# Patient Record
Sex: Female | Born: 1955 | Race: White | Hispanic: No | Marital: Single | Smoking: Never smoker
Health system: Southern US, Community
[De-identification: ages and names within clinical notes are randomized; demographics above are authoritative.]

## PROBLEM LIST (undated history)

## (undated) DIAGNOSIS — H548 Legal blindness, as defined in USA: Secondary | ICD-10-CM

## (undated) DIAGNOSIS — E785 Hyperlipidemia, unspecified: Secondary | ICD-10-CM

## (undated) DIAGNOSIS — I10 Essential (primary) hypertension: Secondary | ICD-10-CM

## (undated) DIAGNOSIS — H269 Unspecified cataract: Secondary | ICD-10-CM

## (undated) DIAGNOSIS — Z8719 Personal history of other diseases of the digestive system: Secondary | ICD-10-CM

## (undated) DIAGNOSIS — M1990 Unspecified osteoarthritis, unspecified site: Secondary | ICD-10-CM

## (undated) DIAGNOSIS — F419 Anxiety disorder, unspecified: Secondary | ICD-10-CM

## (undated) DIAGNOSIS — G8929 Other chronic pain: Secondary | ICD-10-CM

## (undated) DIAGNOSIS — J189 Pneumonia, unspecified organism: Secondary | ICD-10-CM

## (undated) DIAGNOSIS — M545 Other chronic pain: Secondary | ICD-10-CM

## (undated) DIAGNOSIS — F329 Major depressive disorder, single episode, unspecified: Secondary | ICD-10-CM

## (undated) DIAGNOSIS — J342 Deviated nasal septum: Secondary | ICD-10-CM

## (undated) DIAGNOSIS — G43909 Migraine, unspecified, not intractable, without status migrainosus: Secondary | ICD-10-CM

## (undated) DIAGNOSIS — K219 Gastro-esophageal reflux disease without esophagitis: Secondary | ICD-10-CM

## (undated) HISTORY — PX: ROTATOR CUFF REPAIR: SHX139

## (undated) HISTORY — PX: OTHER SURGICAL HISTORY: SHX169

## (undated) HISTORY — DX: Essential (primary) hypertension: I10

## (undated) HISTORY — DX: Unspecified osteoarthritis, unspecified site: M19.90

## (undated) HISTORY — PX: CATARACT EXTRACTION, BILATERAL: SHX1313

## (undated) HISTORY — PX: RETINAL DETACHMENT SURGERY: SHX105

## (undated) HISTORY — DX: Unspecified cataract: H26.9

## (undated) HISTORY — PX: CHOLECYSTECTOMY: SHX55

---

## 1998-06-12 ENCOUNTER — Ambulatory Visit (HOSPITAL_COMMUNITY): Admission: RE | Admit: 1998-06-12 | Discharge: 1998-06-12 | Payer: Self-pay | Admitting: Orthopedic Surgery

## 1998-06-14 ENCOUNTER — Emergency Department (HOSPITAL_COMMUNITY): Admission: EM | Admit: 1998-06-14 | Discharge: 1998-06-14 | Payer: Self-pay | Admitting: Emergency Medicine

## 1998-11-05 ENCOUNTER — Encounter: Payer: Self-pay | Admitting: Orthopedic Surgery

## 1998-11-10 ENCOUNTER — Encounter: Payer: Self-pay | Admitting: Orthopedic Surgery

## 1998-11-11 ENCOUNTER — Inpatient Hospital Stay (HOSPITAL_COMMUNITY): Admission: AD | Admit: 1998-11-11 | Discharge: 1998-11-12 | Payer: Self-pay | Admitting: Orthopedic Surgery

## 1999-01-08 ENCOUNTER — Other Ambulatory Visit: Admission: RE | Admit: 1999-01-08 | Discharge: 1999-01-08 | Payer: Self-pay | Admitting: Emergency Medicine

## 2000-02-29 ENCOUNTER — Encounter: Payer: Self-pay | Admitting: Emergency Medicine

## 2000-02-29 ENCOUNTER — Encounter: Admission: RE | Admit: 2000-02-29 | Discharge: 2000-02-29 | Payer: Self-pay | Admitting: Emergency Medicine

## 2001-05-21 ENCOUNTER — Encounter: Admission: RE | Admit: 2001-05-21 | Discharge: 2001-05-21 | Payer: Self-pay | Admitting: Emergency Medicine

## 2001-05-21 ENCOUNTER — Encounter: Payer: Self-pay | Admitting: Emergency Medicine

## 2002-01-04 ENCOUNTER — Other Ambulatory Visit: Admission: RE | Admit: 2002-01-04 | Discharge: 2002-01-04 | Payer: Self-pay | Admitting: Obstetrics and Gynecology

## 2002-04-25 ENCOUNTER — Encounter: Admission: RE | Admit: 2002-04-25 | Discharge: 2002-04-25 | Payer: Self-pay | Admitting: Emergency Medicine

## 2002-04-25 ENCOUNTER — Encounter: Payer: Self-pay | Admitting: Emergency Medicine

## 2002-05-22 ENCOUNTER — Encounter: Payer: Self-pay | Admitting: Emergency Medicine

## 2002-05-22 ENCOUNTER — Encounter: Admission: RE | Admit: 2002-05-22 | Discharge: 2002-05-22 | Payer: Self-pay | Admitting: Emergency Medicine

## 2002-08-26 ENCOUNTER — Ambulatory Visit (HOSPITAL_BASED_OUTPATIENT_CLINIC_OR_DEPARTMENT_OTHER): Admission: RE | Admit: 2002-08-26 | Discharge: 2002-08-26 | Payer: Self-pay | Admitting: General Surgery

## 2003-05-26 ENCOUNTER — Encounter: Payer: Self-pay | Admitting: Emergency Medicine

## 2003-05-26 ENCOUNTER — Encounter: Admission: RE | Admit: 2003-05-26 | Discharge: 2003-05-26 | Payer: Self-pay | Admitting: Emergency Medicine

## 2003-10-04 HISTORY — PX: OTHER SURGICAL HISTORY: SHX169

## 2003-10-26 ENCOUNTER — Observation Stay (HOSPITAL_COMMUNITY): Admission: EM | Admit: 2003-10-26 | Discharge: 2003-10-27 | Payer: Self-pay | Admitting: Emergency Medicine

## 2004-05-18 ENCOUNTER — Ambulatory Visit (HOSPITAL_COMMUNITY): Admission: RE | Admit: 2004-05-18 | Discharge: 2004-05-18 | Payer: Self-pay | Admitting: Chiropractic Medicine

## 2004-05-24 ENCOUNTER — Ambulatory Visit (HOSPITAL_COMMUNITY): Admission: RE | Admit: 2004-05-24 | Discharge: 2004-05-24 | Payer: Self-pay | Admitting: Chiropractic Medicine

## 2004-05-26 ENCOUNTER — Encounter: Admission: RE | Admit: 2004-05-26 | Discharge: 2004-05-26 | Payer: Self-pay | Admitting: Emergency Medicine

## 2004-06-17 ENCOUNTER — Observation Stay (HOSPITAL_COMMUNITY): Admission: RE | Admit: 2004-06-17 | Discharge: 2004-06-18 | Payer: Self-pay | Admitting: Orthopedic Surgery

## 2004-08-31 ENCOUNTER — Ambulatory Visit (HOSPITAL_COMMUNITY): Admission: RE | Admit: 2004-08-31 | Discharge: 2004-08-31 | Payer: Self-pay | Admitting: *Deleted

## 2005-12-19 ENCOUNTER — Encounter: Admission: RE | Admit: 2005-12-19 | Discharge: 2005-12-19 | Payer: Self-pay | Admitting: Emergency Medicine

## 2007-03-22 ENCOUNTER — Ambulatory Visit: Payer: Self-pay | Admitting: Family Medicine

## 2007-03-26 ENCOUNTER — Ambulatory Visit: Payer: Self-pay | Admitting: *Deleted

## 2007-03-27 ENCOUNTER — Ambulatory Visit (HOSPITAL_COMMUNITY): Admission: RE | Admit: 2007-03-27 | Discharge: 2007-03-27 | Payer: Self-pay | Admitting: Family Medicine

## 2007-03-28 ENCOUNTER — Ambulatory Visit: Payer: Self-pay | Admitting: Internal Medicine

## 2007-05-21 ENCOUNTER — Encounter (INDEPENDENT_AMBULATORY_CARE_PROVIDER_SITE_OTHER): Payer: Self-pay | Admitting: Nurse Practitioner

## 2007-05-21 ENCOUNTER — Ambulatory Visit: Payer: Self-pay | Admitting: Internal Medicine

## 2007-05-21 LAB — CONVERTED CEMR LAB
Cholesterol: 214 mg/dL — ABNORMAL HIGH (ref 0–200)
HDL: 51 mg/dL (ref >39)
LDL Cholesterol: 120 mg/dL — ABNORMAL HIGH (ref 0–99)
Total CHOL/HDL Ratio: 4.2
Triglycerides: 217 mg/dL — ABNORMAL HIGH (ref <150)
VLDL: 43 mg/dL — ABNORMAL HIGH (ref 0–40)

## 2007-06-26 ENCOUNTER — Ambulatory Visit: Payer: Self-pay | Admitting: Internal Medicine

## 2007-07-24 ENCOUNTER — Ambulatory Visit: Payer: Self-pay | Admitting: Nurse Practitioner

## 2007-07-24 LAB — CONVERTED CEMR LAB
Cholesterol: 227 mg/dL — ABNORMAL HIGH (ref 0–200)
HDL: 54 mg/dL (ref >39)
LDL Cholesterol: 136 mg/dL — ABNORMAL HIGH (ref 0–99)
Total CHOL/HDL Ratio: 4.2
Triglycerides: 185 mg/dL — ABNORMAL HIGH (ref <150)
VLDL: 37 mg/dL (ref 0–40)

## 2007-08-24 ENCOUNTER — Ambulatory Visit: Payer: Self-pay | Admitting: Internal Medicine

## 2007-10-17 ENCOUNTER — Ambulatory Visit: Payer: Self-pay | Admitting: Family Medicine

## 2008-03-31 ENCOUNTER — Ambulatory Visit (HOSPITAL_COMMUNITY): Admission: RE | Admit: 2008-03-31 | Discharge: 2008-03-31 | Payer: Self-pay | Admitting: Family Medicine

## 2008-07-15 ENCOUNTER — Ambulatory Visit: Payer: Self-pay | Admitting: Internal Medicine

## 2008-07-15 LAB — CONVERTED CEMR LAB
ALT: 22 units/L (ref 0–35)
AST: 20 units/L (ref 0–37)
Albumin: 4.4 g/dL (ref 3.5–5.2)
Alkaline Phosphatase: 63 units/L (ref 39–117)
BUN: 13 mg/dL (ref 6–23)
Basophils Absolute: 0 10*3/uL (ref 0.0–0.1)
Basophils Relative: 0 % (ref 0–1)
CO2: 19 meq/L (ref 19–32)
Calcium: 8.9 mg/dL (ref 8.4–10.5)
Chloride: 106 meq/L (ref 96–112)
Cholesterol: 220 mg/dL — ABNORMAL HIGH (ref 0–200)
Creatinine, Ser: 0.63 mg/dL (ref 0.40–1.20)
Eosinophils Absolute: 0.3 10*3/uL (ref 0.0–0.7)
Eosinophils Relative: 4 % (ref 0–5)
Glucose, Bld: 101 mg/dL — ABNORMAL HIGH (ref 70–99)
HCT: 38.2 % (ref 36.0–46.0)
HDL: 61 mg/dL (ref >39)
Hemoglobin: 12.4 g/dL (ref 12.0–15.0)
LDL Cholesterol: 122 mg/dL — ABNORMAL HIGH (ref 0–99)
Lymphocytes Relative: 31 % (ref 12–46)
Lymphs Abs: 2.1 10*3/uL (ref 0.7–4.0)
MCHC: 32.5 g/dL (ref 30.0–36.0)
MCV: 92.3 fL (ref 78.0–100.0)
Microalb, Ur: 0.79 mg/dL (ref 0.00–1.89)
Monocytes Absolute: 0.3 10*3/uL (ref 0.1–1.0)
Monocytes Relative: 5 % (ref 3–12)
Neutro Abs: 4.2 10*3/uL (ref 1.7–7.7)
Neutrophils Relative %: 60 % (ref 43–77)
Platelets: 296 10*3/uL (ref 150–400)
Potassium: 3.6 meq/L (ref 3.5–5.3)
RBC: 4.14 M/uL (ref 3.87–5.11)
RDW: 14 % (ref 11.5–15.5)
Sodium: 142 meq/L (ref 135–145)
Total Bilirubin: 0.5 mg/dL (ref 0.3–1.2)
Total CHOL/HDL Ratio: 3.6
Total Protein: 7.1 g/dL (ref 6.0–8.3)
Triglycerides: 187 mg/dL — ABNORMAL HIGH (ref <150)
VLDL: 37 mg/dL (ref 0–40)
WBC: 6.9 10*3/uL (ref 4.0–10.5)

## 2008-07-24 ENCOUNTER — Ambulatory Visit: Payer: Self-pay | Admitting: Internal Medicine

## 2008-08-14 ENCOUNTER — Other Ambulatory Visit: Admission: RE | Admit: 2008-08-14 | Discharge: 2008-08-14 | Payer: Self-pay | Admitting: Obstetrics and Gynecology

## 2008-08-14 ENCOUNTER — Ambulatory Visit: Payer: Self-pay | Admitting: Obstetrics & Gynecology

## 2008-09-04 ENCOUNTER — Ambulatory Visit: Payer: Self-pay | Admitting: Obstetrics & Gynecology

## 2008-10-15 ENCOUNTER — Ambulatory Visit: Payer: Self-pay | Admitting: Internal Medicine

## 2008-10-15 ENCOUNTER — Encounter (INDEPENDENT_AMBULATORY_CARE_PROVIDER_SITE_OTHER): Payer: Self-pay | Admitting: Internal Medicine

## 2008-10-15 LAB — CONVERTED CEMR LAB
Cholesterol: 203 mg/dL — ABNORMAL HIGH (ref 0–200)
HDL: 61 mg/dL (ref >39)
LDL Cholesterol: 111 mg/dL — ABNORMAL HIGH (ref 0–99)
Total CHOL/HDL Ratio: 3.3
Triglycerides: 155 mg/dL — ABNORMAL HIGH (ref <150)
VLDL: 31 mg/dL (ref 0–40)

## 2008-10-24 ENCOUNTER — Ambulatory Visit: Payer: Self-pay | Admitting: Internal Medicine

## 2008-11-13 ENCOUNTER — Ambulatory Visit: Payer: Self-pay | Admitting: Internal Medicine

## 2010-04-28 ENCOUNTER — Ambulatory Visit (HOSPITAL_COMMUNITY): Admission: RE | Admit: 2010-04-28 | Discharge: 2010-04-28 | Payer: Self-pay | Admitting: Emergency Medicine

## 2011-02-15 NOTE — Group Therapy Note (Signed)
NAME:  INTISAR, CLAUDIO                   ACCOUNT NO.:  [id number]   MEDICAL RECORD NO.:  [id number]          PATIENT TYPE:  WOC   LOCATION:  [facility]                   FACILITY:  [facility]   PHYSICIAN:  Dorthula Perfect, MD     DATE OF BIRTH:  [birth year]   DATE OF SERVICE:                                  CLINIC NOTE   This 55 year old white female, gravida 0, is referred from health  therapy because of postmenopausal bleeding.   The patient takes Sprintec 28.  Her last menstrual period was 1-1/2  years ago.  Occasionally, over the past year she has had some spotting.  Two months ago, she had 14 days of bleeding, 10 days of which were heavy  with clots.  She was referred for endometrial biopsy.   PAST MEDICAL HISTORY:   OPERATIONS:  1. Cataracts.  2. Right shoulder operations x2.  3. Left shoulder operation.  4. Two detached retinas.  5. Lumbar disk surgery.  6. D&C years ago.   ALLERGIES:  Denies.   MEDICATIONS:  Listed in the chart and include Maxzide and Prozac.   FAMILY HISTORY:  Negative.   PHYSICAL EXAMINATION:  VITAL SIGNS:  Blood pressure 127/72 and weight  223 pounds.  She is on a weight-loss program.  ABDOMEN:  Somewhat obese, soft, and nontender.  No masses are felt.  PELVIC:  External genitalia and BUS glands are normal.  Vaginal wall is  epithelialized as was the cervix.  There is no bleeding noted.  Uterus  is in the midline.  Normal size and shape.  Ovaries are not felt.   Anterior lip of the cervix was grasped with single tube tenaculum and  endocervical canal sounded approximately 7.5 cm.  Endometrial biopsy was  performed.  Looking in the specimen in cup reveals what appears to be a  polyp.   The patient tolerated the procedure well.   DIAGNOSIS:  Postmenopausal bleeding.   PROCEDURE:  Endometrial biopsy.   DISPOSITION:  1. Continue the Sprintec.  2. Return in 2 weeks for results.           ______________________________  Dorthula Perfect, MD     ER/MEDQ  D:  08/14/2008  T:  08/15/2008  Job:  [id number]

## 2011-02-18 NOTE — H&P (Signed)
NAME:  Anne Anne [name], Anne Anne [name]                             ACCOUNT NO.:  [id number]   MEDICAL RECORD NO.:  [id number]                   PATIENT TYPE:  EMS   LOCATION:  [facility]                                 FACILITY:  [facility]   PHYSICIAN:  Hettie Holstein, D.O.                 DATE OF BIRTH:  1955/11/30   DATE OF ADMISSION:  10/26/2003  DATE OF DISCHARGE:                                HISTORY & PHYSICAL   PRIMARY CARE PHYSICIAN:  Reuben Likes, M.D.   CHIEF COMPLAINT:  Chest pain.   HISTORY OF PRESENT ILLNESS:  This is Anne [name] 55 year old Caucasian female  with  only Anne [name] history of hypertension, no history of diabetes or known  coronary  artery disease, although her EKG this admission does reveal Q-waves to  suggest prior infarct, age indeterminate. She developed chest pain on and  off for the past week. She stated she  initially thought it was indigestion.  Deep breaths seemed to alleviate the pain. She has also had Anne [name] headache for  the past 2 weeks. This morning she developed Anne [name] real sharp midsternal chest  pain that radiated to her back and she developed some tingling sensation  about her arms bilaterally  down to her fingertips and face. She stated that  this got worse and she could feel this in her lips.   She stated the pain was 10/10, and by the time she arrived in the emergency  department and was given IV fluids, some morphine  and potassium, she did  feel somewhat better. The emergency department physician ordered Anne [name] CT scan  of her chest which was normal. The EKG findings were as above. Anteroseptal  infarct, age indeterminate with some PACs.   Otherwise, she does report unilateral headache, throbbing. She states that  she has had headaches before that responded to Vicodin, which her primary  care physician writes her prescriptions for. She has recently been on  treatment for clinically suspected sinus infection with Anne [name] 10 day course of  Augmentin followed  by Anne [name] 9 day course with Avelox.   PAST  MEDICAL HISTORY:  Significant for:  1. Hypertension.  2. History of recurrent sinus infections.  3. Clinical back pain, status post surgery for spurs in her low back.  4. Shoulder surgery.  5. 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  6. Cataract surgery x2.  7. Cholecystectomy.   MEDICATIONS:  1. Hydrochlorothiazide 25 mg/Triamterene 37.5 mg p.o. every day.  2. Birth control pills.  3. Avelox eye drops.  4. Allegra D and Excedrin.  5. Vicodin.   ALLERGIES:  No known drug allergies.   SOCIAL HISTORY:  The patient does not smoke. She only drinks occasional  alcohol. 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   FAMILY HISTORY:  Her father died at age 59 with lung cancer. Her mother is  living, unknown health history per her daughter.   REVIEW OF SYSTEMS:  The patient reports positive nausea. She reports Anne [name]  headache, no fevers or chills. She felt some fluttering palpitations just  with this presenting illness. She denies any diarrhea, constipation or  urinary problems. No lower extremity edema, swelling or pain.   PHYSICAL EXAMINATION:  VITAL SIGNS:  Blood pressure 121/82, temperature  98.1, temperature 98.1, pulse 100, respirations 20, oxygen saturation 97% on  room air.  GENERAL:  This is Anne [name] well appearing, comfortable Caucasian female  in no  apparent distress.  HEENT:  Normocephalic and atraumatic. Extraocular muscles intact. No  conjunctival pallor or jaundice.  NECK:  Supple, nontender, no palpable thyromegaly.  CHEST:  Clear to auscultation bilaterally.  HEART:  Normal S1 and S2 without S3 or murmur.  ABDOMEN:  Soft, nontender, nondistended. Normoactive bowel sounds.  EXTREMITIES:  Symmetrical pulses, no peripheral clubbing, cyanosis or edema.   LABORATORY DATA:  Available as follows:  Her CK initial revealed Anne [name] myoglobin  of 69.7, MB 4.2, troponin less than 0.05 at 3 p.m. Her iSTAT revealed Anne [name]  creatinine of 0.7. Electrolytes revealed Anne [name] sodium  138,  potassium 2.2,  chloride 103 and glucose of 103. Further labs are pending including  CBC,  TMP, TSH and T4.   Anne [name] CT scan revealed no acute findings.   IMPRESSION:  1. Atypical chest pain versus noncardiac chest pain.  2. Hypokalemia.  3. Cephalgia.   PLAN:  At this time we are going to admit Ms. [name] to Anne [name] telemetry  floor for  23-hour observation, cycle her enzymes, hold her HCTZ Triamterene, as this  could possibly be causing her hypokalemia. We will replete her potassium. We  will check repeat  chemistries to be sure that this is Anne [name] correct number and  follow her clinically and treat her headaches with Reglan as well as  Oxycodone if they remain refractory.                                                Hettie Holstein, D.O.    ESS/MEDQ  D:  10/26/2003  T:  10/26/2003  Job:  [id number]   cc:   Reuben Likes, M.D.  [address]  [place]  [place] [address]  Fax: [phone]
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
## 2011-02-18 NOTE — Op Note (Signed)
   NAME:  Anne [name], Anne [name]                             ACCOUNT NO.:  [id number]   MEDICAL RECORD NO.:  [id number]                   PATIENT TYPE:  AMB   LOCATION:  [facility]                                  FACILITY:  [facility]   PHYSICIAN:  Gabrielle Dare. Janee Morn, M.D.             DATE OF BIRTH:  [birth year]   DATE OF PROCEDURE:  08/26/2002  DATE OF DISCHARGE:                                 OPERATIVE REPORT   PREOPERATIVE DIAGNOSIS:  Atypical melanocytic lesion, gluteal cleft.   POSTOPERATIVE DIAGNOSIS:  Atypical melanocytic lesion, gluteal cleft.   PROCEDURE:  Excision of atypical melanocytic lesion in gluteal cleft.   SURGEON:  Gabrielle Dare. Janee Morn, M.D.   ANESTHESIA:  MAC.   CLINICAL NOTE:  The patient is [name] 55 year old female who noticed [name] mole with  some changes at the top of her gluteal cleft.  This was evaluated by her  primary care doctor and removed with [name] shave biopsy.  This showed some  atypical melanocytic proliferation.  She now presents for excision for  definitive pathologic diagnosis.   DESCRIPTION OF PROCEDURE:  The patient was brought to the operating room.  MAC anesthesia was administered.  She was placed in the prone position.  She  received IV antibiotics.  Her gluteal cleft area was prepped and draped in [name]  sterile fashion.  The affected area was infused with 0.25% Marcaine for  anesthesia.  An excision was made in elliptical fashion encompassing the  area of the previous biopsy.  Subcutaneous tissues were dissected down  sharply.  Upon entering the area of the previous lesion, biopsy was removed  completely along with [name] small amount of subcutaneous tissue.  This was  tagged for orientation and sent to pathology.  The subcutaneous area was  irrigated.  Bovie cautery was used to get meticulous hemostasis.  Subsequently the area was closed with [name] series of interrupted 3-0 nylon  sutures.  Sponge and needle counts were correct.  The patient tolerated the  procedure well without  complication and was taken to the recovery room in  stable condition.                                               Gabrielle Dare Janee Morn, M.D.    BET/MEDQ  D:  08/26/2002  T:  08/26/2002  Job:  [id number]

## 2011-02-18 NOTE — Discharge Summary (Signed)
NAME:  [name], [name]                             ACCOUNT NO.:  [id number]   MEDICAL RECORD NO.:  [id number]                   PATIENT TYPE:  INP   LOCATION:  3712                                 FACILITY:  [facility]   PHYSICIAN:  Hettie Holstein, D.O.                 DATE OF BIRTH:  August 16, 1956   DATE OF ADMISSION:  10/26/2003  DATE OF DISCHARGE:  10/27/2003                                 DISCHARGE SUMMARY   ADMISSION DIAGNOSES:  1. Chest pain.  2. Hypokalemia.  3. Headache.   DISCHARGE DIAGNOSES:  1. Chest pain, no evidence of acute myocardial infarction by serial cardiac     enzymes and no evidence of pulmonary embolism by CT scan of the chest.  2. Hypokalemia, improved, however, the patient is still hypokalemic and     administering p.o. potassium supplements with follow-up lab work and     instructions for the patient to return with recurrent symptoms.  3. Cephalgia, much improved.  The patient responded to Reglan in the     hospital, however, I am sending her home with p.r.n. q.h.s. Valium for     suspected tension-type headache.   DISPOSITION:  The patient is being discharged to home in stable condition,  improved, and asymptomatic with recommendations for low cholesterol and low  sodium diet and instructions to have basic metabolic panel performed with  results faxed to her primary care physician, Reuben Likes, M.D., at [phone].  For any problems or difficulties, she is to call [phone] regarding  this hospitalization or she was instructed to return to the hospital if she  experienced recurrence of her symptoms.  She has a follow-up appointment  with Dr. Lorenz Coaster at 11:15 a.m. on November 03, 2003.   HISTORY OF PRESENT ILLNESS:  The patient presented on October 26, 2003, with  complaints of chest pain.  She has a history of hypertension.  No diabetes  or coronary artery disease noted.  She has EKG that revealed anterior Q  waves, age undeterminate.  She developed chest pain on  and off for the past  week.  She states she initially thought it was indigestion.  Deep breath  seemed to alleviate the pain.  She also had developed a real sharp  midsternal chest pain that radiated to her back and she developed a tingling  sensation about her arms bilaterally down to her fingertips and face.  She  states that this got worse and she could feel this in her limbs.  She stated  that the pain was 10 out of 10 and by the time she arrived in the emergency  department and was given IV fluids of morphine and potassium, she felt  better.  ER ordered a CT scan which was normal and EKG findings were as  above age indeterminate infarct and some PAC's.   HOSPITAL COURSE:  She was  admitted to the telemetry floor, her enzymes were  cycled.  She exhibited no bump in her cardiac markers and no evidence of  acute MI.  Her headache resolved with Reglan and her chest discomfort and  vague complaints resolved with repletion of potassium p.o. 20 mEq q.4h.  Her  hydrochlorothiazide/Triamterene was held and it was suspected as possible  contributing factor in her electrolyte abnormalities.   DISCHARGE MEDICATIONS:  The patient is instructed to stop taking her  hydrochlorothiazide until further recommendations per her primary care  physician, Dr. Lorenz Coaster.  She should resume her other medications as prior to  admission.  She is instructed to take K-Lor 40 mEq p.o. q.4h x3 doses, then  K-Lor 40 mEq p.o. daily x7 days.  She should have a follow-up lab in a week.  She was also instructed to take magnesium oxide 400 mg p.o. daily x1 week.   DISCHARGE LABORATORY DATA:  Normal CT of the chest.  EKG with a questionable  intraseptal infarct in the past, age indeterminate.   Sodium 138, potassium 2.8, chloride 103, CO2 27, BUN 12, creatinine 0.7.  Cardiac markers negative x3.  Thyroid studies were within normal limits.  She had a CBC that revealed WBC of 9.2, hemoglobin 13.6, hematocrit 40.4,  MCV 91,  platelet count 336.   DISCHARGE RECOMMENDATIONS:  1. She should undergo outpatient risk stratification.  Recommend an     outpatient treadmill stress test.  2. She should have follow-up lab studies.  3. She should see her primary care physician on November 03, 2003.                                                Hettie Holstein, D.O.    ESS/MEDQ  D:  10/27/2003  T:  10/27/2003  Job:  [id number]   cc:   Reuben Likes, M.D.  [address]  [place]  [place] [address]  Fax: [phone]

## 2012-08-07 ENCOUNTER — Other Ambulatory Visit (HOSPITAL_COMMUNITY): Payer: Self-pay | Admitting: Internal Medicine

## 2012-08-07 DIAGNOSIS — Z1231 Encounter for screening mammogram for malignant neoplasm of breast: Secondary | ICD-10-CM

## 2012-08-09 ENCOUNTER — Ambulatory Visit (HOSPITAL_COMMUNITY): Payer: Self-pay

## 2012-08-27 ENCOUNTER — Ambulatory Visit (HOSPITAL_COMMUNITY)
Admission: RE | Admit: 2012-08-27 | Discharge: 2012-08-27 | Disposition: A | Discharge status: Home / Self Care | Payer: Self-pay | Source: Ambulatory Visit | Attending: Internal Medicine | Admitting: Internal Medicine

## 2012-08-27 DIAGNOSIS — Z1231 Encounter for screening mammogram for malignant neoplasm of breast: Secondary | ICD-10-CM

## 2012-08-31 ENCOUNTER — Other Ambulatory Visit: Payer: Self-pay | Admitting: Internal Medicine

## 2012-08-31 DIAGNOSIS — R928 Other abnormal and inconclusive findings on diagnostic imaging of breast: Secondary | ICD-10-CM

## 2012-09-10 ENCOUNTER — Encounter (HOSPITAL_COMMUNITY): Payer: Self-pay | Admitting: *Deleted

## 2012-09-18 ENCOUNTER — Other Ambulatory Visit: Payer: Self-pay | Admitting: Obstetrics and Gynecology

## 2012-09-18 ENCOUNTER — Ambulatory Visit (HOSPITAL_COMMUNITY)
Admission: RE | Admit: 2012-09-18 | Discharge: 2012-09-18 | Disposition: A | Discharge status: Home / Self Care | Payer: Self-pay | Source: Ambulatory Visit | Attending: Obstetrics and Gynecology | Admitting: Obstetrics and Gynecology

## 2012-09-18 ENCOUNTER — Encounter (HOSPITAL_COMMUNITY): Payer: Self-pay

## 2012-09-18 VITALS — BP 110/76 | Temp 98.3°F | Ht 66.5 in | Wt 212.8 lb

## 2012-09-18 DIAGNOSIS — N898 Other specified noninflammatory disorders of vagina: Secondary | ICD-10-CM

## 2012-09-18 DIAGNOSIS — Z01419 Encounter for gynecological examination (general) (routine) without abnormal findings: Secondary | ICD-10-CM

## 2012-09-18 NOTE — Progress Notes (Signed)
Patient referred to [facility] by the [facility] per recommendation of additional imaging of her right breast. Screening mammogram completed 08/27/2012 at [facility].  Pap Smear:  Pap smear completed today. Per patient last Pap smear was around 2009 and normal. Per patient no history of abnormal Pap smears. No Pap smear results in EPIC.  Physical exam: Breasts Breasts symmetrical. No skin abnormalities bilateral breasts. No nipple retraction bilateral breasts. No nipple discharge bilateral breasts. No lymphadenopathy. No lumps palpated bilateral breasts. Patient referred to the [facility] per recommendation for right breast diagnostic mammogram and possible right breast ultrasound. Appointment scheduled for Monday, October 01, 2012 at 1540.           Pelvic/Bimanual   Ext Genitalia No lesions, no swelling and no discharge observed on external genitalia.         Vagina Vagina pink and normal texture. No lesions and white frothy discharge observed in vagina. Wet prep completed.          Cervix Cervix is present. Cervix pink and of normal texture. White frothy discharge observed on cervical os.    Uterus Uterus is present and palpable. Uterus in normal position and normal size.        Adnexae Bilateral ovaries present and palpable. No tenderness on palpation.          Rectovaginal No rectal exam completed today since patient had no rectal complaints. No skin abnormalities observed on exam.

## 2012-09-18 NOTE — Patient Instructions (Signed)
Taught patient how to perform BSE and gave educational materials to take home. Let her know [facility] will cover Pap smears every 3 years unless has a history of abnormal Pap smears. Patient referred to the [facility] per recommendation for right breast diagnostic mammogram and possible right breast ultrasound. Appointment scheduled for Monday, October 01, 2012 at 1540.Patient aware of appointment and will be there. Let patient know will follow up with her within the next couple weeks with results by letter or phone. Patient verbalized understanding.

## 2012-09-20 LAB — WET PREP, GENITAL
Clue Cells Wet Prep HPF POC: NONE SEEN
Trich, Wet Prep: NONE SEEN
Yeast Wet Prep HPF POC: NONE SEEN

## 2012-10-01 ENCOUNTER — Other Ambulatory Visit: Payer: Self-pay

## 2012-10-01 ENCOUNTER — Ambulatory Visit
Admission: RE | Admit: 2012-10-01 | Discharge: 2012-10-01 | Disposition: A | Discharge status: Home / Self Care | Payer: No Typology Code available for payment source | Source: Ambulatory Visit | Attending: Internal Medicine | Admitting: Internal Medicine

## 2012-10-01 DIAGNOSIS — R928 Other abnormal and inconclusive findings on diagnostic imaging of breast: Secondary | ICD-10-CM

## 2013-03-24 ENCOUNTER — Ambulatory Visit: Payer: Self-pay | Admitting: Family Medicine

## 2013-03-24 VITALS — BP 154/88 | HR 100 | Temp 97.9°F | Resp 16 | Ht 66.0 in | Wt 223.0 lb

## 2013-03-24 DIAGNOSIS — J0100 Acute maxillary sinusitis, unspecified: Secondary | ICD-10-CM

## 2013-03-24 DIAGNOSIS — I10 Essential (primary) hypertension: Secondary | ICD-10-CM

## 2013-03-24 DIAGNOSIS — R05 Cough: Secondary | ICD-10-CM

## 2013-03-24 MED ORDER — GUAIFENESIN-CODEINE 100-10 MG/5ML PO SOLN: 5.0000 mL | Freq: Four times a day (QID) | ORAL | Status: DC | PRN

## 2013-03-24 MED ORDER — FLUCONAZOLE 150 MG PO TABS: 150.0000 mg | ORAL_TABLET | Freq: Once | ORAL | Status: DC

## 2013-03-24 MED ORDER — AMOXICILLIN-POT CLAVULANATE 875-125 MG PO TABS: 1.0000 | ORAL_TABLET | Freq: Two times a day (BID) | ORAL | Status: DC

## 2013-03-24 NOTE — Progress Notes (Signed)
[address]   [place], [place]  [address]   [phone]  Subjective:    Patient ID: Anne [name], female    DOB: 1955-10-24, 57 y.o.   MRN: [id number]  HPI This 57 y.o. female presents for evaluation of the following:  1.  Cold:onset ten days ago.  No fever but +chills.  Malaise and fatigue.  Mild headache.  L ear pain behind ear.  Ringing in L ear.  Decreased hearing.  Rhinorrhea bloody.  +nasal congestion.  +PND; laryngitis; +hoarseness.  +coughing.  No SOB.  Possible wheezing.  No v; +diarrhea.   presented to clinic up the road; prescribed Flonase; recommended to start Allegra.  Has a tickle in throat and having horrible coughing fits; worse at night; sore on L sided chest from excessive cough.  Green-brown sputum; taking liquor, Robitussin, Mucinex.    2. HTN: forgot medication today; major stressors.  Blood pressure last week 122/85.  Mild headache; +dizziness.  No blurred vision; no n/t.  No chest pain, SOB, leg swelling.    PCP:  Valentina Lucks  Review of Systems  Constitutional: Positive for chills and fatigue. Negative for fever and diaphoresis.  HENT: Positive for hearing loss, ear pain, congestion, rhinorrhea, postnasal drip and tinnitus. Negative for ear discharge.   Respiratory: Positive for cough. Negative for shortness of breath, wheezing and stridor.   Gastrointestinal: Positive for diarrhea. Negative for nausea, vomiting and abdominal pain.  Neurological: Positive for headaches. Negative for dizziness and light-headedness.   Past Medical History  Diagnosis Date  . Arthritis   . Cataract   . Hypertension    Past Surgical History  Procedure Laterality Date  . Cataract extraction    . Retinal detachment surgery    . Rotator cuff repair      both sholulders  . Cholecystectomy    . Lower back surgery    . Eye surgery     History   Social History  . Marital Status: Single    Spouse Name: N/A    Number of Children: N/A  . Years of Education: N/A   Occupational  History  . Not on file.   Social History Main Topics  . Smoking status: Never Smoker   . Smokeless tobacco: Not on file  . Alcohol Use: No  . Drug Use: No  . Sexually Active: Yes    Birth Control/ Protection: None   Other Topics Concern  . Not on file   Social History Narrative  . No narrative on file   Current Outpatient Prescriptions on File Prior to Visit  Medication Sig Dispense Refill  . hydrochlorothiazide (HYDRODIURIL) 12.5 MG tablet Take 12.5 mg by mouth daily.      Marland Kitchen ALPRAZolam (XANAX) 0.5 MG tablet Take 0.5 mg by mouth at bedtime as needed.       No current facility-administered medications on file prior to visit.       Objective:   Physical Exam  Nursing note and vitals reviewed. Constitutional: She is oriented to person, place, and time. She appears well-developed and well-nourished. No distress.  HENT:  Head: Normocephalic and atraumatic.  Right Ear: External ear normal.  Left Ear: External ear normal.  Nose: Mucosal edema and rhinorrhea present. Right sinus exhibits maxillary sinus tenderness. Right sinus exhibits no frontal sinus tenderness. Left sinus exhibits maxillary sinus tenderness. Left sinus exhibits no frontal sinus tenderness.  Mouth/Throat: Oropharynx is clear and moist.  Eyes: Conjunctivae are normal. Pupils are equal, round, and reactive to light.  Neck: Normal range of motion. Neck supple. No thyromegaly present.  Cardiovascular: Normal rate, regular rhythm and normal heart sounds.   Pulmonary/Chest: Effort normal and breath sounds normal. She has no wheezes. She has no rales.  Lymphadenopathy:    She has cervical adenopathy.  Neurological: She is alert and oriented to person, place, and time.  Skin: Skin is warm and dry. No rash noted. She is not diaphoretic.  Psychiatric: She has a normal mood and affect. Her behavior is normal.       Assessment & Plan:  Acute maxillary sinusitis - Plan: amoxicillin-clavulanate (AUGMENTIN) 875-125 MG per  tablet, fluconazole (DIFLUCAN) 150 MG tablet  Essential hypertension, benign  Cough - Plan: guaiFENesin-codeine 100-10 MG/5ML syrup   1. Acute Maxillary Sinusitis:  New. Rx for Augmentin provided.   2. Cough:  New. Rx for Robitussin with codeine provided.  Recommend Mucinex DM bid. 3.  HTN: uncontrolled; non-compliance with medication today; recommend checking BP daily for the next week.  Meds ordered this encounter  Medications  . amoxicillin-clavulanate (AUGMENTIN) 875-125 MG per tablet    Sig: Take 1 tablet by mouth 2 (two) times daily.    Dispense:  20 tablet    Refill:  0  . guaiFENesin-codeine 100-10 MG/5ML syrup    Sig: Take 5 mLs by mouth 4 (four) times daily as needed for cough.    Dispense:  240 mL    Refill:  0  . fluconazole (DIFLUCAN) 150 MG tablet    Sig: Take 1 tablet (150 mg total) by mouth once. Repeat if needed    Dispense:  2 tablet    Refill:  0

## 2013-03-24 NOTE — Patient Instructions (Addendum)
1.  Check blood pressure daily for the next week. 2.  Return to office for shortness of breath or worsening symptoms.

## 2013-03-28 ENCOUNTER — Telehealth: Payer: Self-pay

## 2013-03-28 MED ORDER — BENZONATATE 100 MG PO CAPS: 100.0000 mg | ORAL_CAPSULE | Freq: Three times a day (TID) | ORAL | Status: DC | PRN

## 2013-03-28 NOTE — Telephone Encounter (Signed)
I sent rx for Tessalon Perles to pharmacy; she can take it with cough syrup.  Pt should continue flonase and allegra as discussed at visit.

## 2013-03-28 NOTE — Telephone Encounter (Signed)
Dr. Katrinka Blazing  Patient is calling with a tickle in her throat.  It doesn't want to go away.  [phone]

## 2013-03-29 ENCOUNTER — Telehealth: Payer: Self-pay

## 2013-03-29 NOTE — Telephone Encounter (Signed)
PT STATES THAT SHE IS HAVING BLACK STOOL AND DIARRHEA  WHILE TAKING THE ANTIBIOTIC THAT WAS PRESCRIBED TO HER, SHE WOULD LIKE SOME ADVISE AS TO WHAT SHE SHOULD DO. BEST# [phone] PHARMACY:[facility]

## 2013-03-29 NOTE — Telephone Encounter (Signed)
Left message to advise

## 2013-03-29 NOTE — Telephone Encounter (Signed)
She should come back in for the black stool, this is not normal left message to advise. She is advised to call me back.

## 2013-03-29 NOTE — Telephone Encounter (Signed)
Called again. Left message.

## 2013-04-01 NOTE — Telephone Encounter (Signed)
Patient not returning calls

## 2013-10-22 ENCOUNTER — Other Ambulatory Visit: Payer: Self-pay | Admitting: Gastroenterology

## 2013-10-22 ENCOUNTER — Other Ambulatory Visit (HOSPITAL_COMMUNITY): Payer: Self-pay | Admitting: Internal Medicine

## 2013-10-22 DIAGNOSIS — Z1231 Encounter for screening mammogram for malignant neoplasm of breast: Secondary | ICD-10-CM

## 2013-10-25 ENCOUNTER — Encounter (HOSPITAL_COMMUNITY): Payer: Self-pay | Admitting: Pharmacy Technician

## 2013-10-29 ENCOUNTER — Ambulatory Visit (HOSPITAL_COMMUNITY)
Admission: RE | Admit: 2013-10-29 | Discharge: 2013-10-29 | Disposition: A | Discharge status: Home / Self Care | Payer: BC Managed Care – PPO | Source: Ambulatory Visit | Attending: Internal Medicine | Admitting: Internal Medicine

## 2013-10-29 DIAGNOSIS — Z1231 Encounter for screening mammogram for malignant neoplasm of breast: Secondary | ICD-10-CM | POA: Insufficient documentation

## 2013-10-30 ENCOUNTER — Encounter (HOSPITAL_COMMUNITY): Payer: Self-pay | Admitting: *Deleted

## 2013-11-12 ENCOUNTER — Encounter (HOSPITAL_COMMUNITY)
Admission: RE | Disposition: A | Discharge status: Home / Self Care | Payer: BC Managed Care – PPO | Source: Ambulatory Visit | Attending: Gastroenterology

## 2013-11-12 ENCOUNTER — Encounter (HOSPITAL_COMMUNITY): Payer: BC Managed Care – PPO | Admitting: Anesthesiology

## 2013-11-12 ENCOUNTER — Ambulatory Visit (HOSPITAL_COMMUNITY): Payer: BC Managed Care – PPO | Admitting: Anesthesiology

## 2013-11-12 ENCOUNTER — Encounter (HOSPITAL_COMMUNITY): Payer: Self-pay | Admitting: *Deleted

## 2013-11-12 ENCOUNTER — Ambulatory Visit (HOSPITAL_COMMUNITY)
Admission: RE | Admit: 2013-11-12 | Discharge: 2013-11-12 | Disposition: A | Discharge status: Home / Self Care | Payer: BC Managed Care – PPO | Source: Ambulatory Visit | Attending: Gastroenterology | Admitting: Gastroenterology

## 2013-11-12 DIAGNOSIS — H548 Legal blindness, as defined in USA: Secondary | ICD-10-CM | POA: Insufficient documentation

## 2013-11-12 DIAGNOSIS — Z1211 Encounter for screening for malignant neoplasm of colon: Secondary | ICD-10-CM | POA: Insufficient documentation

## 2013-11-12 DIAGNOSIS — Q120 Congenital cataract: Secondary | ICD-10-CM | POA: Insufficient documentation

## 2013-11-12 DIAGNOSIS — K219 Gastro-esophageal reflux disease without esophagitis: Secondary | ICD-10-CM | POA: Insufficient documentation

## 2013-11-12 DIAGNOSIS — G43909 Migraine, unspecified, not intractable, without status migrainosus: Secondary | ICD-10-CM | POA: Insufficient documentation

## 2013-11-12 DIAGNOSIS — Z9089 Acquired absence of other organs: Secondary | ICD-10-CM | POA: Insufficient documentation

## 2013-11-12 DIAGNOSIS — I10 Essential (primary) hypertension: Secondary | ICD-10-CM | POA: Insufficient documentation

## 2013-11-12 HISTORY — PX: COLONOSCOPY WITH PROPOFOL: SHX5780

## 2013-11-12 SURGERY — COLONOSCOPY WITH PROPOFOL
Anesthesia: Monitor Anesthesia Care

## 2013-11-12 MED ORDER — PROPOFOL 10 MG/ML IV BOLUS
INTRAVENOUS | Status: DC | PRN
  Administered 2013-11-12 (×3): 50 mg via INTRAVENOUS
  Administered 2013-11-12: 25 mg via INTRAVENOUS

## 2013-11-12 MED ORDER — LACTATED RINGERS IV SOLN
INTRAVENOUS | Status: DC
  Administered 2013-11-12: 1000 mL via INTRAVENOUS

## 2013-11-12 MED ORDER — PROPOFOL 10 MG/ML IV BOLUS
INTRAVENOUS | Status: AC
  Filled 2013-11-12: qty 20

## 2013-11-12 MED ORDER — LACTATED RINGERS IV SOLN
INTRAVENOUS | Status: DC | PRN
  Administered 2013-11-12: 11:00:00 via INTRAVENOUS

## 2013-11-12 MED ORDER — SODIUM CHLORIDE 0.9 % IV SOLN: INTRAVENOUS | Status: DC

## 2013-11-12 SURGICAL SUPPLY — 22 items

## 2013-11-12 NOTE — Anesthesia Postprocedure Evaluation (Signed)
  Anesthesia Post-op Note  Patient: Anne [name]  Procedure(s) Performed: Procedure(s) (LRB): COLONOSCOPY WITH PROPOFOL (N/A)  Patient Location: PACU  Anesthesia Type: MAC  Level of Consciousness: awake and alert   Airway and Oxygen Therapy: Patient Spontanous Breathing  Post-op Pain: mild  Post-op Assessment: Post-op Vital signs reviewed, Patient's Cardiovascular Status Stable, Respiratory Function Stable, Patent Airway and No signs of Nausea or vomiting  Last Vitals:  Filed Vitals:   11/12/13 1206  BP: 123/55  Pulse: 72  Temp:   Resp: 16    Post-op Vital Signs: stable   Complications: No apparent anesthesia complications

## 2013-11-12 NOTE — Op Note (Signed)
Procedure: Screening colonoscopy  Endoscopist: Earle Gell  Premedication: Propofol administered by anesthesia  Procedure: The patient was placed in the left lateral decubitus position. Anal inspection and digital rectal exam were normal. The Pentax pediatric colonoscope was introduced into the rectum and advanced to the cecum. A normal-appearing appendiceal orifice and ileocecal valve were identified. Colonic preparation for the exam today was good.  Rectum. Normal. Retroflexed view of the distal rectum normal.  Sigmoid colon and descending colon. Normal  Splenic flexure. Normal  Transverse colon. Normal  Hepatic flexure. Normal  Ascending colon. Normal  Cecum and ileocecal valve. Normal  Assessment: Normal screening proctocolonoscopy to the cecum  Recommendation: Schedule repeat screening colonoscopy in 10 years

## 2013-11-12 NOTE — H&P (Signed)
Procedure: Screening colonoscopy  History: The patient is a 58 year old female born 1955-11-16 who is scheduled to undergo a screening colonoscopy with colon polyp removal to prevent colon cancer.  Past medical history: Bilateral rotator cuff repair. Cholecystectomy. Right detached retina surgery twice. Cataract surgery. Hypertension. Migraine headache syndrome. Gastroesophageal reflux. Depression and anxiety. 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  Medication allergies: None    Habits: The patient has never smoked cigarettes  Exam: The patient is alert and lying comfortably on the endoscopy stretcher. Abdomen is soft and nontender to palpation. Lungs are clear to auscultation. Cardiac exam reveals a regular rhythm.  Plan: Proceed with screening colonoscopy

## 2013-11-12 NOTE — Anesthesia Preprocedure Evaluation (Addendum)
Anesthesia Evaluation  Patient identified by MRN, date of birth, ID band Patient awake    Reviewed: Allergy & Precautions, H&P , NPO status , Patient's Chart, lab work & pertinent test results  Airway Mallampati: II TM Distance: >3 FB Neck ROM: full    Dental no notable dental hx. (+) Teeth Intact and Dental Advisory Given   Pulmonary neg pulmonary ROS,  breath sounds clear to auscultation  Pulmonary exam normal       Cardiovascular Exercise Tolerance: Good hypertension, Pt. on medications negative cardio ROS  Rhythm:regular Rate:Normal     Neuro/Psych negative neurological ROS  negative psych ROS   GI/Hepatic negative GI ROS, Neg liver ROS,   Endo/Other  negative endocrine ROS  Renal/GU negative Renal ROS  negative genitourinary   Musculoskeletal   Abdominal   Peds  Hematology negative hematology ROS (+)   Anesthesia Other Findings   Reproductive/Obstetrics negative OB ROS                          Anesthesia Physical Anesthesia Plan  ASA: II  Anesthesia Plan: MAC   Post-op Pain Management:    Induction:   Airway Management Planned: Nasal Cannula  Additional Equipment:   Intra-op Plan:   Post-operative Plan:   Informed Consent: I have reviewed the patients History and Physical, chart, labs and discussed the procedure including the risks, benefits and alternatives for the proposed anesthesia with the patient or authorized representative who has indicated his/her understanding and acceptance.   Dental Advisory Given  Plan Discussed with: CRNA and Surgeon  Anesthesia Plan Comments:         Anesthesia Quick Evaluation

## 2013-11-12 NOTE — Transfer of Care (Signed)
Immediate Anesthesia Transfer of Care Note  Patient: Anne [name]  Procedure(s) Performed: Procedure(s): COLONOSCOPY WITH PROPOFOL (N/A)  Patient Location: PACU  Anesthesia Type:MAC  Level of Consciousness: awake, alert , sedated and patient cooperative  Airway & Oxygen Therapy: Patient Spontanous Breathing and Patient connected to face mask oxygen  Post-op Assessment: Report given to PACU RN and Post -op Vital signs reviewed and stable  Post vital signs: Reviewed and stable  Complications: No apparent anesthesia complications

## 2013-11-12 NOTE — Discharge Instructions (Signed)
Colonoscopy °Care After °These instructions give you information on caring for yourself after your procedure. Your doctor may also give you more specific instructions. Call your doctor if you have any problems or questions after your procedure. °HOME CARE °· Take it easy for the next 24 hours. °· Rest. °· Walk or use warm packs on your belly (abdomen) if you have belly cramping or gas. °· Do not drive for 24 hours. °· You may shower. °· Do not sign important papers or use machinery for 24 hours. °· Drink enough fluids to keep your pee (urine) clear or pale yellow. °· Resume your normal diet. Avoid heavy or fried foods. °· Avoid alcohol. °· Continue taking your normal medicines. °· Only take medicine as told by your doctor. Do not take aspirin. °If you had growths (polyps) removed: °· Do not take aspirin. °· Do not drink alcohol for 7 days or as told by your doctor. °· Eat a soft diet for 24 hours. °GET HELP RIGHT AWAY IF: °· You have a fever. °· You pass clumps of tissue (blood clots) or fill the toilet with blood. °· You have belly pain that gets worse and medicine does not help. °· Your belly is puffy (swollen). °· You feel sick to your stomach (nauseous) or throw up (vomit). °MAKE SURE YOU: °· Understand these instructions. °· Will watch your condition. °· Will get help right away if you are not doing well or get worse. °Document Released: 10/22/2010 Document Revised: 12/12/2011 Document Reviewed: 05/27/2013 °Exit[employer]® Patient Information ©2014 [employer]. °Monitored Anesthesia Care  °Monitored anesthesia care is an anesthesia service for a medical procedure. Anesthesia is the loss of the ability to feel pain. It is produced by medications called anesthetics. It may affect a small area of your body (local anesthesia), a large area of your body (regional anesthesia), or your entire body (general anesthesia). The need for monitored anesthesia care depends your procedure, your condition, and the potential need  for regional or general anesthesia. It is often provided during procedures where:  °· General anesthesia may be needed if there are complications. This is because you need special care when you are under general anesthesia.   °· You will be under local or regional anesthesia. This is so that you are able to have higher levels of anesthesia if needed.   °· You will receive calming medications (sedatives). This is especially the case if sedatives are given to put you in a semi-conscious state of relaxation (deep sedation). This is because the amount of sedative needed to produce this state can be hard to predict. Too much of a sedative can produce general anesthesia. °Monitored anesthesia care is performed by one or more caregivers who have special training in all types of anesthesia. You will need to meet with these caregivers before your procedure. During this meeting, they will ask you about your medical history. They will also give you instructions to follow. (For example, you will need to stop eating and drinking before your procedure. You may also need to stop or change medications you are taking.) During your procedure, your caregivers will stay with you. They will:  °· Watch your condition. This includes watching you blood pressure, breathing, and level of pain.   °· Diagnose and treat problems that occur.   °· Give medications if they are needed. These may include calming medications (sedatives) and anesthetics.   °· Make sure you are comfortable.   °Having monitored anesthesia care does not necessarily mean that you will be under anesthesia.   It does mean that your caregivers will be able to manage anesthesia if you need it or if it occurs. It also means that you will be able to have a different type of anesthesia than you are having if you need it. When your procedure is complete, your caregivers will continue to watch your condition. They will make sure any medications wear off before you are allowed to go  home.  °Document Released: 06/15/2005 Document Revised: 01/14/2013 Document Reviewed: 10/31/2012 °ExitCare® Patient Information ©2014 [employer]. ° °

## 2013-11-14 ENCOUNTER — Encounter (HOSPITAL_COMMUNITY): Payer: Self-pay | Admitting: Gastroenterology

## 2014-07-13 ENCOUNTER — Ambulatory Visit (INDEPENDENT_AMBULATORY_CARE_PROVIDER_SITE_OTHER): Payer: BC Managed Care – PPO | Admitting: Emergency Medicine

## 2014-07-13 VITALS — BP 122/84 | HR 91 | Temp 98.6°F | Resp 18 | Ht 66.5 in | Wt 220.0 lb

## 2014-07-13 DIAGNOSIS — J209 Acute bronchitis, unspecified: Secondary | ICD-10-CM

## 2014-07-13 MED ORDER — AMOXICILLIN-POT CLAVULANATE 875-125 MG PO TABS: 1.0000 | ORAL_TABLET | Freq: Two times a day (BID) | ORAL | Status: DC

## 2014-07-13 MED ORDER — AZITHROMYCIN 250 MG PO TABS: ORAL_TABLET | ORAL | Status: DC

## 2014-07-13 MED ORDER — PROMETHAZINE-CODEINE 6.25-10 MG/5ML PO SYRP: 5.0000 mL | ORAL_SOLUTION | Freq: Four times a day (QID) | ORAL | Status: DC | PRN

## 2014-07-13 NOTE — Progress Notes (Signed)
[facility] [address], [place] [address] [phone]  Date:  07/13/2014   Name:  Anne [name]   DOB:  [birth year]   MRN:  [id number]  PCP:  Cloyd Stagers, MD    Chief Complaint: Cough, Nasal Congestion and Fever   History of Present Illness:  Anne [name] is a 58 y.o. very pleasant female patient who presents with the following:  Ill this week with nasal congestion and clear nasal drainage.  Sore throat and hoarse Has cough productive purulent green sputum.  No wheezing or shortness of breath.  Had temp to 101 this week.  Nausea and vomiting Wednesday with some loose stool.  The patient has no complaint of blood, mucous, or pus in her stools.  No improvement with over the counter medications or other home remedies. Denies other complaint or health concern today.   There are no active problems to display for this patient.   Past Medical History  Diagnosis Date  . Arthritis   . Cataract     hx of  . Hypertension     Past Surgical History  Procedure Laterality Date  . Cataract extraction    . Retinal detachment surgery    . Rotator cuff repair      both sholulders  . Cholecystectomy    . Lower back surgery    . Eye surgery    . Right arm tendon repair  2005  . Colonoscopy with propofol N/A 11/12/2013    Procedure: COLONOSCOPY WITH PROPOFOL;  Surgeon: Garlan Fair, MD;  Location: [facility];  Service: Endoscopy;  Laterality: N/A;    History  Substance Use Topics  . Smoking status: Never Smoker   . Smokeless tobacco: Never Used  . Alcohol Use: Yes     Comment: very rare use    Family History  Problem Relation Age of Onset  . Diabetes Mother   . Hypertension Mother   . Heart attack Father   . Cancer Father     lung    No Known Allergies  Medication list has been reviewed and updated.  Current Outpatient Prescriptions on File Prior to Visit  Medication Sig Dispense Refill  . aspirin EC 81 MG tablet Take 81  mg by mouth every other day.      Marland Kitchen aspirin-acetaminophen-caffeine (EXCEDRIN EXTRA STRENGTH) 250-250-65 MG per tablet Take 2 tablets by mouth every 6 (six) hours as needed for headache or migraine.      . Calcium-Vitamin D-Vitamin K (CALCIUM + D + K PO) Take 1 tablet by mouth every morning.      Marland Kitchen GLUCOSAMINE-CHONDROITIN PO Take 1 tablet by mouth every morning.      Marland Kitchen ibuprofen (ADVIL,MOTRIN) 200 MG tablet Take 400 mg by mouth every 6 (six) hours as needed (For aches and pain in joints.).      Marland Kitchen Multiple Vitamin (MULTIVITAMIN WITH MINERALS) TABS tablet Take 1 tablet by mouth every morning.      . triamterene-hydrochlorothiazide (MAXZIDE) 75-50 MG per tablet Take 0.5 tablets by mouth at bedtime.      . traZODone (DESYREL) 50 MG tablet Take 50-100 mg by mouth at bedtime.       No current facility-administered medications on file prior to visit.    Review of Systems:  As per HPI, otherwise negative.    Physical Examination: Filed Vitals:   07/13/14 1222  BP: 122/84  Pulse: 91  Temp: 98.6 F (37 C)  Resp: 18  Filed Vitals:   07/13/14 1222  Height: 5' 6.5" (1.689 m)  Weight: 220 lb (99.791 kg)   Body mass index is 34.98 kg/(m^2). Ideal Body Weight: Weight in (lb) to have BMI = 25: 156.9  GEN: WDWN, NAD, Non-toxic, A & O x 3 HEENT: Atraumatic, Normocephalic. Neck supple. No masses, No LAD. Ears and Nose: No external deformity. CV: RRR, No M/G/R. No JVD. No thrill. No extra heart sounds. PULM: CTA B, no wheezes, crackles, rhonchi. No retractions. No resp. distress. No accessory muscle use. ABD: S, NT, ND, +BS. No rebound. No HSM. EXTR: No c/c/e NEURO Normal gait.  PSYCH: Normally interactive. Conversant. Not depressed or anxious appearing.  Calm demeanor.    Assessment and Plan: Bronchitis Phen c cod zpak  Signed,  Ellison Carwin, MD

## 2014-07-13 NOTE — Patient Instructions (Signed)

## 2014-11-28 ENCOUNTER — Other Ambulatory Visit: Payer: Self-pay | Admitting: Internal Medicine

## 2014-11-28 ENCOUNTER — Other Ambulatory Visit (HOSPITAL_COMMUNITY): Payer: Self-pay | Admitting: Internal Medicine

## 2014-11-28 ENCOUNTER — Other Ambulatory Visit (HOSPITAL_COMMUNITY)
Admission: RE | Admit: 2014-11-28 | Discharge: 2014-11-28 | Disposition: A | Discharge status: Home / Self Care | Payer: BLUE CROSS/BLUE SHIELD | Source: Ambulatory Visit | Attending: Internal Medicine | Admitting: Internal Medicine

## 2014-11-28 DIAGNOSIS — Z01419 Encounter for gynecological examination (general) (routine) without abnormal findings: Secondary | ICD-10-CM | POA: Insufficient documentation

## 2014-11-28 DIAGNOSIS — Z1151 Encounter for screening for human papillomavirus (HPV): Secondary | ICD-10-CM | POA: Diagnosis present

## 2014-11-28 DIAGNOSIS — Z1231 Encounter for screening mammogram for malignant neoplasm of breast: Secondary | ICD-10-CM

## 2014-12-02 ENCOUNTER — Other Ambulatory Visit (HOSPITAL_COMMUNITY): Payer: Self-pay | Admitting: Internal Medicine

## 2014-12-02 ENCOUNTER — Ambulatory Visit (HOSPITAL_COMMUNITY)
Admission: RE | Admit: 2014-12-02 | Discharge: 2014-12-02 | Disposition: A | Discharge status: Home / Self Care | Payer: BLUE CROSS/BLUE SHIELD | Source: Ambulatory Visit | Attending: Internal Medicine | Admitting: Internal Medicine

## 2014-12-02 DIAGNOSIS — Z1231 Encounter for screening mammogram for malignant neoplasm of breast: Secondary | ICD-10-CM | POA: Diagnosis not present

## 2014-12-03 LAB — CYTOLOGY - PAP

## 2014-12-27 ENCOUNTER — Encounter (HOSPITAL_COMMUNITY): Payer: Self-pay

## 2014-12-27 ENCOUNTER — Emergency Department (HOSPITAL_COMMUNITY): Payer: BLUE CROSS/BLUE SHIELD

## 2014-12-27 ENCOUNTER — Emergency Department (HOSPITAL_COMMUNITY)
Admission: EM | Admit: 2014-12-27 | Discharge: 2014-12-28 | Disposition: A | Discharge status: Home / Self Care | Payer: BLUE CROSS/BLUE SHIELD | Attending: Emergency Medicine | Admitting: Emergency Medicine

## 2014-12-27 DIAGNOSIS — I10 Essential (primary) hypertension: Secondary | ICD-10-CM | POA: Diagnosis not present

## 2014-12-27 DIAGNOSIS — R079 Chest pain, unspecified: Secondary | ICD-10-CM | POA: Diagnosis present

## 2014-12-27 DIAGNOSIS — E876 Hypokalemia: Secondary | ICD-10-CM | POA: Diagnosis not present

## 2014-12-27 DIAGNOSIS — J209 Acute bronchitis, unspecified: Secondary | ICD-10-CM | POA: Insufficient documentation

## 2014-12-27 DIAGNOSIS — M1990 Unspecified osteoarthritis, unspecified site: Secondary | ICD-10-CM | POA: Diagnosis not present

## 2014-12-27 DIAGNOSIS — Z79899 Other long term (current) drug therapy: Secondary | ICD-10-CM | POA: Diagnosis not present

## 2014-12-27 DIAGNOSIS — Z8669 Personal history of other diseases of the nervous system and sense organs: Secondary | ICD-10-CM | POA: Insufficient documentation

## 2014-12-27 LAB — I-STAT TROPONIN, ED: TROPONIN I, POC: 0.01 ng/mL (ref 0.00–0.08)

## 2014-12-27 NOTE — ED Provider Notes (Signed)
CSN: [id number]     Arrival date & time 12/27/14  2240 History   First MD Initiated Contact with Patient 12/27/14 2313     Chief Complaint  Patient presents with  . Cough  . Chest Pain     (Consider location/radiation/quality/duration/timing/severity/associated sxs/prior Treatment) HPI Comments: Patient presents today with a chief complaint of cough.  She reports that the cough has been present for the past 4 days and is gradually worsening.  She states that yesterday the cough was productive of sputum, but today was a dry cough.  She states that she has had recurrent bronchitis for the past three months.  She has not taken anything at home for her symptoms prior to arrival.  She reports that she had a fever of 100 F orally yesterday.  Temp 98.7 orally upon arrival in the ED.  No recent antipyretic use.  She denies SOB, LE edema, nausea, vomiting, sinus pain, ear pain, or hemoptysis.  She does report associated nasal congestion.  She also reports that she has some pain across her chest, but only with repetitive coughing.  No pain when she is not coughing.  She does not smoke and states that she never has.  The history is provided by the patient.    Past Medical History  Diagnosis Date  . Arthritis   . Cataract     hx of  . Hypertension    Past Surgical History  Procedure Laterality Date  . Cataract extraction    . Retinal detachment surgery    . Rotator cuff repair      both sholulders  . Cholecystectomy    . Lower back surgery    . Eye surgery    . Right arm tendon repair  2005  . Colonoscopy with propofol N/A 11/12/2013    Procedure: COLONOSCOPY WITH PROPOFOL;  Surgeon: Garlan Fair, MD;  Location: [facility];  Service: Endoscopy;  Laterality: N/A;   Family History  Problem Relation Age of Onset  . Diabetes Mother   . Hypertension Mother   . Heart attack Father   . Cancer Father     lung   History  Substance Use Topics  . Smoking status: Never Smoker   .  Smokeless tobacco: Never Used  . Alcohol Use: Yes     Comment: very rare use   OB History    Gravida Para Term Preterm AB TAB SAB Ectopic Multiple Living   0              Review of Systems  All other systems reviewed and are negative.     Allergies  Review of patient's allergies indicates no known allergies.  Home Medications   Prior to Admission medications   Medication Sig Start Date End Date Taking? Authorizing Provider  aspirin EC 81 MG tablet Take 81 mg by mouth every other day.   Yes Historical Provider, MD  aspirin-acetaminophen-caffeine (Kinder) [id number] MG per tablet Take 2 tablets by mouth every 6 (six) hours as needed for headache or migraine.   Yes Historical Provider, MD  buPROPion (WELLBUTRIN XL) 150 MG 24 hr tablet Take 150 mg by mouth daily.   Yes Historical Provider, MD  Calcium-Vitamin D-Vitamin K (CALCIUM + D + K PO) Take 1 tablet by mouth every morning.   Yes Historical Provider, MD  Carboxymethylcell-Hypromellose (GENTEAL OP) Apply 1 drop to eye 3 (three) times daily as needed (dry eyes).   Yes Historical Provider, MD  fluticasone (FLONASE) 50 MCG/ACT  nasal spray Place 1 spray into the nose daily as needed for allergies.  11/28/14  Yes Historical Provider, MD  GLUCOSAMINE-CHONDROITIN PO Take 1 tablet by mouth every morning.   Yes Historical Provider, MD  ibuprofen (ADVIL,MOTRIN) 200 MG tablet Take 400 mg by mouth every 6 (six) hours as needed (For aches and pain in joints.).   Yes Historical Provider, MD  Multiple Vitamin (MULTIVITAMIN WITH MINERALS) TABS tablet Take 1 tablet by mouth every morning.   Yes Historical Provider, MD  Omega-3 Fatty Acids (FISH OIL) 1200 MG CPDR Take 2,400 mg by mouth daily.   Yes Historical Provider, MD  triamterene-hydrochlorothiazide (MAXZIDE) 75-50 MG per tablet Take 0.5 tablets by mouth at bedtime.   Yes Historical Provider, MD  amoxicillin-clavulanate (AUGMENTIN) 875-125 MG per tablet Take 1 tablet by mouth 2  (two) times daily. Patient not taking: Reported on 12/27/2014 07/13/14   Roselee Culver, MD  promethazine-codeine [facility] WITH CODEINE) 6.25-10 MG/5ML syrup Take 5-10 mLs by mouth every 6 (six) hours as needed. Patient not taking: Reported on 12/27/2014 07/13/14   Roselee Culver, MD   BP 136/61 mmHg  Pulse 81  Temp(Src) 98.7 F (37.1 C) (Oral)  Resp 18  SpO2 97% Physical Exam  Constitutional: She appears well-developed and well-nourished.  HENT:  Head: Normocephalic and atraumatic.  Mouth/Throat: Oropharynx is clear and moist.  Neck: Normal range of motion. Neck supple.  Cardiovascular: Normal rate, regular rhythm and normal heart sounds.   Pulmonary/Chest: Effort normal and breath sounds normal. No respiratory distress. She has no wheezes. She has no rales.  Musculoskeletal: Normal range of motion.  Neurological: She is alert.  Skin: Skin is warm and dry.  Psychiatric: She has a normal mood and affect.  Nursing note and vitals reviewed.   ED Course  Procedures (including critical care time) Labs Review Labs Reviewed  [place], ED    Imaging Review Dg Chest Port 1 View  12/27/2014   CLINICAL DATA:  Cough and chest pain for 1 day.  EXAM: PORTABLE CHEST - 1 VIEW  COMPARISON:  None.  FINDINGS: The heart size is normal. There is mild tortuosity of the thoracic aorta. Diffuse increased interstitial markings and mild central bronchial thickening. Pulmonary vasculature is normal. No consolidation, pleural effusion, or pneumothorax. No acute osseous abnormalities are seen.  IMPRESSION: Increased interstitial markings and mild central bronchial thickening, may reflect bronchitis, acute or chronic.   Electronically Signed   By: Jeb Levering M.D.   On: 12/27/2014 23:08     EKG Interpretation None      MDM   Final diagnoses:  None   Patient presents today with a chief complaint of cough.  Cough has been present for the past 4 days  and is worsening.  VSS.  No hypoxia.  Pulse ox 97-98 on RA.  Labs unremarkable aside from Hypokalemia.  Patient given Potassium in the ED.  CXR showing mild central bronchial thickening, which may represent bronchitis acute or chronic.  No signs of Pneumonia.  Feel that the patient is stable for discharge.  Return precautions given.      Hyman Bible, PA-C 12/29/14 0110  Shanon Rosser, MD 12/29/14 2130

## 2014-12-27 NOTE — ED Notes (Addendum)
Pt presents with c/o chest pain that started today as well as a cough that started today. Pt reports her chest hurts when she is coughing. Pt also c/o headache and nasal congestion.

## 2014-12-28 LAB — BASIC METABOLIC PANEL
Anion gap: 13 (ref 5–15)
BUN: 18 mg/dL (ref 6–23)
CALCIUM: 9 mg/dL (ref 8.4–10.5)
CHLORIDE: 105 mmol/L (ref 96–112)
CO2: 21 mmol/L (ref 19–32)
CREATININE: 1.04 mg/dL (ref 0.50–1.10)
GFR calc Af Amer: 67 mL/min — ABNORMAL LOW (ref >90)
GFR calc non Af Amer: 58 mL/min — ABNORMAL LOW (ref >90)
Glucose, Bld: 93 mg/dL (ref 70–99)
Potassium: 2.8 mmol/L — ABNORMAL LOW (ref 3.5–5.1)
SODIUM: 139 mmol/L (ref 135–145)

## 2014-12-28 LAB — CBC
HCT: 39.5 % (ref 36.0–46.0)
Hemoglobin: 13.4 g/dL (ref 12.0–15.0)
MCH: 30.1 pg (ref 26.0–34.0)
MCHC: 33.9 g/dL (ref 30.0–36.0)
MCV: 88.8 fL (ref 78.0–100.0)
Platelets: 177 10*3/uL (ref 150–400)
RBC: 4.45 MIL/uL (ref 3.87–5.11)
RDW: 14.2 % (ref 11.5–15.5)
WBC: 5.5 10*3/uL (ref 4.0–10.5)

## 2014-12-28 MED ORDER — ALBUTEROL SULFATE HFA 108 (90 BASE) MCG/ACT IN AERS: 1.0000 | INHALATION_SPRAY | Freq: Four times a day (QID) | RESPIRATORY_TRACT | Status: DC | PRN

## 2014-12-28 MED ORDER — LEVOFLOXACIN 750 MG PO TABS: 750.0000 mg | ORAL_TABLET | Freq: Every day | ORAL | Status: DC

## 2014-12-28 MED ORDER — POTASSIUM CHLORIDE CRYS ER 20 MEQ PO TBCR: 40.0000 meq | EXTENDED_RELEASE_TABLET | Freq: Once | ORAL | Status: DC

## 2014-12-28 MED ORDER — HYDROCOD POLST-CHLORPHEN POLST 10-8 MG/5ML PO LQCR: 5.0000 mL | Freq: Two times a day (BID) | ORAL | Status: DC | PRN

## 2014-12-28 MED ORDER — POTASSIUM CHLORIDE 10 MEQ/100ML IV SOLN: 10.0000 meq | Freq: Once | INTRAVENOUS | Status: DC

## 2014-12-28 MED ORDER — POTASSIUM CHLORIDE CRYS ER 20 MEQ PO TBCR
60.0000 meq | EXTENDED_RELEASE_TABLET | Freq: Once | ORAL | Status: AC
  Administered 2014-12-28: 60 meq via ORAL
  Filled 2014-12-28: qty 3

## 2014-12-28 NOTE — Discharge Instructions (Signed)
Acute Bronchitis Bronchitis is inflammation of the airways that extend from the windpipe into the lungs (bronchi). The inflammation often causes mucus to develop. This leads to a cough, which is the most common symptom of bronchitis.  In acute bronchitis, the condition usually develops suddenly and goes away over time, usually in a couple weeks. Smoking, allergies, and asthma can make bronchitis worse. Repeated episodes of bronchitis may cause further lung problems.  CAUSES Acute bronchitis is most often caused by the same virus that causes a cold. The virus can spread from person to person (contagious) through coughing, sneezing, and touching contaminated objects. SIGNS AND SYMPTOMS   Cough.   Fever.   Coughing up mucus.   Body aches.   Chest congestion.   Chills.   Shortness of breath.   Sore throat.  DIAGNOSIS  Acute bronchitis is usually diagnosed through a physical exam. Your health care provider will also ask you questions about your medical history. Tests, such as chest X-rays, are sometimes done to rule out other conditions.  TREATMENT  Acute bronchitis usually goes away in a couple weeks. Oftentimes, no medical treatment is necessary. Medicines are sometimes given for relief of fever or cough. Antibiotic medicines are usually not needed but may be prescribed in certain situations. In some cases, an inhaler may be recommended to help reduce shortness of breath and control the cough. A cool mist vaporizer may also be used to help thin bronchial secretions and make it easier to clear the chest.  HOME CARE INSTRUCTIONS  Get plenty of rest.   Drink enough fluids to keep your urine clear or pale yellow (unless you have a medical condition that requires fluid restriction). Increasing fluids may help thin your respiratory secretions (sputum) and reduce chest congestion, and it will prevent dehydration.   Take medicines only as directed by your health care provider.  If  you were prescribed an antibiotic medicine, finish it all even if you start to feel better.  Avoid smoking and secondhand smoke. Exposure to cigarette smoke or irritating chemicals will make bronchitis worse. If you are a smoker, consider using nicotine gum or skin patches to help control withdrawal symptoms. Quitting smoking will help your lungs heal faster.   Reduce the chances of another bout of acute bronchitis by washing your hands frequently, avoiding people with cold symptoms, and trying not to touch your hands to your mouth, nose, or eyes.   Keep all follow-up visits as directed by your health care provider.  SEEK MEDICAL CARE IF: Your symptoms do not improve after 1 week of treatment.  SEEK IMMEDIATE MEDICAL CARE IF:  You develop an increased fever or chills.   You have chest pain.   You have severe shortness of breath.  You have bloody sputum.   You develop dehydration.  You faint or repeatedly feel like you are going to pass out.  You develop repeated vomiting.  You develop a severe headache. MAKE SURE YOU:   Understand these instructions.  Will watch your condition.  Will get help right away if you are not doing well or get worse. Document Released: 10/27/2004 Document Revised: 02/03/2014 Document Reviewed: 03/12/2013 [facility] Patient Information 2015 [place], [place]. This information is not intended to replace advice given to you by your health care provider. Make sure you discuss any questions you have with your health care provider.  Hypokalemia Hypokalemia means that the amount of potassium in the blood is lower than normal.Potassium is a chemical, called an electrolyte, that helps  regulate the amount of fluid in the body. It also stimulates muscle contraction and helps nerves function properly.Most of the body's potassium is inside of cells, and only a very small amount is in the blood. Because the amount in the blood is so small, minor changes can be  life-threatening. CAUSES  Antibiotics.  Diarrhea or vomiting.  Using laxatives too much, which can cause diarrhea.  Chronic kidney disease.  Water pills (diuretics).  Eating disorders (bulimia).  Low magnesium level.  Sweating a lot. SIGNS AND SYMPTOMS  Weakness.  Constipation.  Fatigue.  Muscle cramps.  Mental confusion.  Skipped heartbeats or irregular heartbeat (palpitations).  Tingling or numbness. DIAGNOSIS  Your health care provider can diagnose hypokalemia with blood tests. In addition to checking your potassium level, your health care provider may also check other lab tests. TREATMENT Hypokalemia can be treated with potassium supplements taken by mouth or adjustments in your current medicines. If your potassium level is very low, you may need to get potassium through a vein (IV) and be monitored in the hospital. A diet high in potassium is also helpful. Foods high in potassium are:  Nuts, such as peanuts and pistachios.  Seeds, such as sunflower seeds and pumpkin seeds.  Peas, lentils, and lima beans.  Whole grain and bran cereals and breads.  Fresh fruit and vegetables, such as apricots, avocado, bananas, cantaloupe, kiwi, oranges, tomatoes, asparagus, and potatoes.  Orange and tomato juices.  Red meats.  Fruit yogurt. HOME CARE INSTRUCTIONS  Take all medicines as prescribed by your health care provider.  Maintain a healthy diet by including nutritious food, such as fruits, vegetables, nuts, whole grains, and lean meats.  If you are taking a laxative, be sure to follow the directions on the label. SEEK MEDICAL CARE IF:  Your weakness gets worse.  You feel your heart pounding or racing.  You are vomiting or having diarrhea.  You are diabetic and having trouble keeping your blood glucose in the normal range. SEEK IMMEDIATE MEDICAL CARE IF:  You have chest pain, shortness of breath, or dizziness.  You are vomiting or having diarrhea for  more than 2 days.  You faint. MAKE SURE YOU:   Understand these instructions.  Will watch your condition.  Will get help right away if you are not doing well or get worse. Document Released: 09/19/2005 Document Revised: 07/10/2013 Document Reviewed: 03/22/2013 [facility] Patient Information 2015 [place], [place]. This information is not intended to replace advice given to you by your health care provider. Make sure you discuss any questions you have with your health care provider.

## 2015-11-24 ENCOUNTER — Other Ambulatory Visit: Payer: Self-pay

## 2015-11-24 DIAGNOSIS — Z1231 Encounter for screening mammogram for malignant neoplasm of breast: Secondary | ICD-10-CM

## 2015-12-10 ENCOUNTER — Ambulatory Visit
Admission: RE | Admit: 2015-12-10 | Discharge: 2015-12-10 | Disposition: A | Discharge status: Home / Self Care | Payer: BLUE CROSS/BLUE SHIELD | Source: Ambulatory Visit

## 2015-12-10 DIAGNOSIS — Z1231 Encounter for screening mammogram for malignant neoplasm of breast: Secondary | ICD-10-CM

## 2016-01-13 ENCOUNTER — Ambulatory Visit: Payer: Self-pay | Admitting: Podiatry

## 2016-12-05 ENCOUNTER — Other Ambulatory Visit: Payer: Self-pay | Admitting: Internal Medicine

## 2016-12-05 ENCOUNTER — Other Ambulatory Visit (HOSPITAL_COMMUNITY)
Admission: RE | Admit: 2016-12-05 | Discharge: 2016-12-05 | Disposition: A | Discharge status: Home / Self Care | Payer: BLUE CROSS/BLUE SHIELD | Source: Ambulatory Visit | Attending: Internal Medicine | Admitting: Internal Medicine

## 2016-12-05 ENCOUNTER — Encounter (HOSPITAL_COMMUNITY): Payer: Self-pay | Admitting: *Deleted

## 2016-12-05 DIAGNOSIS — Z0000 Encounter for general adult medical examination without abnormal findings: Secondary | ICD-10-CM | POA: Diagnosis not present

## 2016-12-05 DIAGNOSIS — Z01419 Encounter for gynecological examination (general) (routine) without abnormal findings: Secondary | ICD-10-CM | POA: Diagnosis not present

## 2016-12-05 DIAGNOSIS — E785 Hyperlipidemia, unspecified: Secondary | ICD-10-CM | POA: Diagnosis not present

## 2016-12-06 ENCOUNTER — Ambulatory Visit: Payer: Self-pay | Admitting: Orthopedic Surgery

## 2016-12-07 ENCOUNTER — Other Ambulatory Visit: Payer: Self-pay | Admitting: Internal Medicine

## 2016-12-07 DIAGNOSIS — Z1231 Encounter for screening mammogram for malignant neoplasm of breast: Secondary | ICD-10-CM

## 2016-12-07 LAB — CYTOLOGY - PAP
Adequacy: ABSENT
DIAGNOSIS: NEGATIVE

## 2016-12-09 ENCOUNTER — Ambulatory Visit: Payer: BLUE CROSS/BLUE SHIELD

## 2016-12-13 ENCOUNTER — Ambulatory Visit: Payer: BLUE CROSS/BLUE SHIELD

## 2016-12-16 ENCOUNTER — Other Ambulatory Visit (HOSPITAL_COMMUNITY): Payer: Self-pay | Admitting: Emergency Medicine

## 2016-12-16 NOTE — Progress Notes (Signed)
Surgical clearance Dr Fara Olden on chart LOV Dr Fara Olden 12-05-16 on chart CMP/ CBCdiff 12-05-16 on chart EKG 12-05-16 on chart

## 2016-12-16 NOTE — Patient Instructions (Addendum)
Claressa A Driscoll  12/16/2016   Your procedure is scheduled on: 12-26-16  Report to [facility] Main  Entrance take [facility]  elevators to 3rd floor to  [place] at [phone].  Call this number if you have problems the morning of surgery [phone]   Remember: ONLY 1 PERSON MAY GO WITH YOU TO SHORT STAY TO GET  READY MORNING OF [place].  Do not eat food After Midnight. YOU MAY HAVE CLEAR LIQUIDS FROM MIDNIGHT UNTIL 745AM DAY OF SURGERY. NOTHING BY MOUTH AFTER 745AM!!     Take these medicines the morning of surgery with A SIP OF WATER: tylenol as needed, bupropion(Wellbutrin), eye drops, nasal spray as needed                                You may not have any metal on your body including hair pins and              piercings  Do not wear jewelry, make-up, lotions, powders or perfumes, deodorant             Do not wear nail polish.  Do not shave  48 hours prior to surgery.              Men may shave face and neck.   Do not bring valuables to the hospital. [place].  Contacts, dentures or bridgework may not be worn into surgery.  Leave suitcase in the car. After surgery it may be brought to your room.                Please read over the following fact sheets you were given: _____________________________________________________________________     CLEAR LIQUID DIET   Foods Allowed                                                                     Foods Excluded  Coffee and tea, regular and decaf                             liquids that you cannot  Plain Jell-O in any flavor                                             see through such as: Fruit ices (not with fruit pulp)                                     milk, soups, orange juice  Iced Popsicles                                    All solid food Carbonated beverages, regular and diet  Cranberry, grape and apple  juices Sports drinks like Gatorade Lightly seasoned clear broth or consume(fat free) Sugar, honey syrup  Sample Menu Breakfast                                Lunch                                     Supper Cranberry juice                    Beef broth                            Chicken broth Jell-O                                     Grape juice                           Apple juice Coffee or tea                        Jell-O                                      Popsicle                                                Coffee or tea                        Coffee or tea  _____________________________________________________________________  [facility] - Preparing for Surgery Before surgery, you can play an important role.  Because skin is not sterile, your skin needs to be as free of germs as possible.  You can reduce the number of germs on your skin by washing with CHG (chlorahexidine gluconate) soap before surgery.  CHG is an antiseptic cleaner which kills germs and bonds with the skin to continue killing germs even after washing. Please DO NOT use if you have an allergy to CHG or antibacterial soaps.  If your skin becomes reddened/irritated stop using the CHG and inform your nurse when you arrive at Short Stay. Do not shave (including legs and underarms) for at least 48 hours prior to the first CHG shower.  You may shave your face/neck. Please follow these instructions carefully:  1.  Shower with CHG Soap the night before surgery and the  morning of Surgery.  2.  If you choose to wash your hair, wash your hair first as usual with your  normal  shampoo.  3.  After you shampoo, rinse your hair and body thoroughly to remove the  shampoo.                           4.  Use CHG as you would any other liquid soap.  You can apply chg directly  to the skin and wash  Gently with a scrungie or clean washcloth.  5.  Apply the CHG Soap to your body ONLY FROM THE NECK DOWN.   Do not  use on face/ open                           Wound or open sores. Avoid contact with eyes, ears mouth and genitals (private parts).                       Wash face,  Genitals (private parts) with your normal soap.             6.  Wash thoroughly, paying special attention to the area where your surgery  will be performed.  7.  Thoroughly rinse your body with warm water from the neck down.  8.  DO NOT shower/wash with your normal soap after using and rinsing off  the CHG Soap.                9.  Pat yourself dry with a clean towel.            10.  Wear clean pajamas.            11.  Place clean sheets on your bed the night of your first shower and do not  sleep with pets. Day of Surgery : Do not apply any lotions/deodorants the morning of surgery.  Please wear clean clothes to the hospital/surgery center.  FAILURE TO FOLLOW THESE INSTRUCTIONS MAY RESULT IN THE CANCELLATION OF YOUR SURGERY PATIENT SIGNATURE_________________________________  NURSE SIGNATURE__________________________________  ________________________________________________________________________   Adam Phenix  An incentive spirometer is a tool that can help keep your lungs clear and active. This tool measures how well you are filling your lungs with each breath. Taking long deep breaths may help reverse or decrease the chance of developing breathing (pulmonary) problems (especially infection) following:  A long period of time when you are unable to move or be active. BEFORE THE PROCEDURE   If the spirometer includes an indicator to show your best effort, your nurse or respiratory therapist will set it to a desired goal.  If possible, sit up straight or lean slightly forward. Try not to slouch.  Hold the incentive spirometer in an upright position. INSTRUCTIONS FOR USE  1. Sit on the edge of your bed if possible, or sit up as far as you can in bed or on a chair. 2. Hold the incentive spirometer in an upright  position. 3. Breathe out normally. 4. Place the mouthpiece in your mouth and seal your lips tightly around it. 5. Breathe in slowly and as deeply as possible, raising the piston or the ball toward the top of the column. 6. Hold your breath for 3-5 seconds or for as long as possible. Allow the piston or ball to fall to the bottom of the column. 7. Remove the mouthpiece from your mouth and breathe out normally. 8. Rest for a few seconds and repeat Steps 1 through 7 at least 10 times every 1-2 hours when you are awake. Take your time and take a few normal breaths between deep breaths. 9. The spirometer may include an indicator to show your best effort. Use the indicator as a goal to work toward during each repetition. 10. After each set of 10 deep breaths, practice coughing to be sure your lungs are clear. If you have an incision (the cut made at the time of  surgery), support your incision when coughing by placing a pillow or rolled up towels firmly against it. Once you are able to get out of bed, walk around indoors and cough well. You may stop using the incentive spirometer when instructed by your caregiver.  RISKS AND COMPLICATIONS  Take your time so you do not get dizzy or light-headed.  If you are in pain, you may need to take or ask for pain medication before doing incentive spirometry. It is harder to take a deep breath if you are having pain. AFTER USE  Rest and breathe slowly and easily.  It can be helpful to keep track of a log of your progress. Your caregiver can provide you with a simple table to help with this. If you are using the spirometer at home, follow these instructions: [place] IF:   You are having difficultly using the spirometer.  You have trouble using the spirometer as often as instructed.  Your pain medication is not giving enough relief while using the spirometer.  You develop fever of 100.5 F (38.1 C) or higher. SEEK IMMEDIATE MEDICAL CARE IF:    You cough up bloody sputum that had not been present before.  You develop fever of 102 F (38.9 C) or greater.  You develop worsening pain at or near the incision site. MAKE SURE YOU:   Understand these instructions.  Will watch your condition.  Will get help right away if you are not doing well or get worse. Document Released: 01/30/2007 Document Revised: 12/12/2011 Document Reviewed: 04/02/2007 ExitCare Patient Information 2014 Exit[employer], [place].   ________________________________________________________________________  WHAT IS A BLOOD TRANSFUSION? Blood Transfusion Information  A transfusion is the replacement of blood or some of its parts. Blood is made up of multiple cells which provide different functions.  Red blood cells carry oxygen and are used for blood loss replacement.  White blood cells fight against infection.  Platelets control bleeding.  Plasma helps clot blood.  Other blood products are available for specialized needs, such as hemophilia or other clotting disorders. BEFORE THE TRANSFUSION  Who gives blood for transfusions?   Healthy volunteers who are fully evaluated to make sure their blood is safe. This is blood bank blood. Transfusion therapy is the safest it has ever been in the practice of medicine. Before blood is taken from a donor, a complete history is taken to make sure that person has no history of diseases nor engages in risky social behavior (examples are intravenous drug use or sexual activity with multiple partners). The donor's travel history is screened to minimize risk of transmitting infections, such as malaria. The donated blood is tested for signs of infectious diseases, such as HIV and hepatitis. The blood is then tested to be sure it is compatible with you in order to minimize the chance of a transfusion reaction. If you or a relative donates blood, this is often done in anticipation of surgery and is not appropriate for emergency  situations. It takes many days to process the donated blood. RISKS AND COMPLICATIONS Although transfusion therapy is very safe and saves many lives, the main dangers of transfusion include:   Getting an infectious disease.  Developing a transfusion reaction. This is an allergic reaction to something in the blood you were given. Every precaution is taken to prevent this. The decision to have a blood transfusion has been considered carefully by your caregiver before blood is given. Blood is not given unless the benefits outweigh the risks. AFTER THE  TRANSFUSION  Right after receiving a blood transfusion, you will usually feel much better and more energetic. This is especially true if your red blood cells have gotten low (anemic). The transfusion raises the level of the red blood cells which carry oxygen, and this usually causes an energy increase.  The nurse administering the transfusion will monitor you carefully for complications. HOME CARE INSTRUCTIONS  No special instructions are needed after a transfusion. You may find your energy is better. Speak with your caregiver about any limitations on activity for underlying diseases you may have. SEEK MEDICAL CARE IF:   Your condition is not improving after your transfusion.  You develop redness or irritation at the intravenous (IV) site. SEEK IMMEDIATE MEDICAL CARE IF:  Any of the following symptoms occur over the next 12 hours:  Shaking chills.  You have a temperature by mouth above 102 F (38.9 C), not controlled by medicine.  Chest, back, or muscle pain.  People around you feel you are not acting correctly or are confused.  Shortness of breath or difficulty breathing.  Dizziness and fainting.  You get a rash or develop hives.  You have a decrease in urine output.  Your urine turns a dark color or changes to pink, red, or brown. Any of the following symptoms occur over the next 10 days:  You have a temperature by mouth above  102 F (38.9 C), not controlled by medicine.  Shortness of breath.  Weakness after normal activity.  The white part of the eye turns yellow (jaundice).  You have a decrease in the amount of urine or are urinating less often.  Your urine turns a dark color or changes to pink, red, or brown. Document Released: 09/16/2000 Document Revised: 12/12/2011 Document Reviewed: 05/05/2008 [facility] Patient Information 2014 [place], [place].  _______________________________________________________________________

## 2016-12-19 ENCOUNTER — Encounter (HOSPITAL_COMMUNITY): Payer: Self-pay

## 2016-12-19 ENCOUNTER — Encounter (HOSPITAL_COMMUNITY)
Admission: RE | Admit: 2016-12-19 | Discharge: 2016-12-19 | Disposition: A | Discharge status: Home / Self Care | Payer: BLUE CROSS/BLUE SHIELD | Source: Ambulatory Visit | Attending: Orthopedic Surgery | Admitting: Orthopedic Surgery

## 2016-12-19 DIAGNOSIS — Z01818 Encounter for other preprocedural examination: Secondary | ICD-10-CM | POA: Diagnosis not present

## 2016-12-19 DIAGNOSIS — M1712 Unilateral primary osteoarthritis, left knee: Secondary | ICD-10-CM | POA: Diagnosis not present

## 2016-12-19 HISTORY — DX: Anxiety disorder, unspecified: F41.9

## 2016-12-19 LAB — SURGICAL PCR SCREEN
MRSA, PCR: POSITIVE — AB
STAPHYLOCOCCUS AUREUS: POSITIVE — AB

## 2016-12-19 LAB — PROTIME-INR
INR: 0.95
Prothrombin Time: 12.7 seconds (ref 11.4–15.2)

## 2016-12-19 LAB — APTT: APTT: 24 s (ref 24–36)

## 2016-12-19 LAB — ABO/RH: ABO/RH(D): O POS

## 2016-12-21 DIAGNOSIS — E669 Obesity, unspecified: Secondary | ICD-10-CM | POA: Diagnosis not present

## 2016-12-21 DIAGNOSIS — M179 Osteoarthritis of knee, unspecified: Secondary | ICD-10-CM | POA: Diagnosis not present

## 2016-12-21 DIAGNOSIS — E785 Hyperlipidemia, unspecified: Secondary | ICD-10-CM | POA: Diagnosis not present

## 2016-12-21 DIAGNOSIS — I10 Essential (primary) hypertension: Secondary | ICD-10-CM | POA: Diagnosis not present

## 2016-12-23 ENCOUNTER — Ambulatory Visit: Payer: Self-pay | Admitting: Orthopedic Surgery

## 2016-12-23 NOTE — Progress Notes (Signed)
Pt aware of surgical time change for Monday 12/26/2016; pt aware to arrive at [facility] short stay at 12:30pm; pt verbalized understanding of no food after midnight clear liquid diet from midnight till 8:30 am then nothing by mouth.

## 2016-12-23 NOTE — H&P (Signed)
Anne [name] DOB: 1955/12/26 Single / Language: English / Race: White Female Date of Admission:  12/26/2016 CC: Left Knee Pain History of Present Illness  The patient is a 61 year old female who comes in for a preoperative History and Physical. The patient is scheduled for a left total knee arthroplasty to be performed by Dr. Dione Plover. Aluisio, MD at [facility] on 12-26-2016. The patient is being followed for their bilateral knee pain and osteoarthritis. They havbe had a left knee cortisone injection in the past. Symptoms reported include: pain, pain after sitting, pain at night, aching, giving way, instability, pain with weightbearing, difficulty ambulating and difficulty arising from chair. The patient feels that they are doing poorly and report their pain level to be moderate to severe. The following medication has been used for pain control: antiinflammatory medication (Motrin). The patient has not gotten any relief of their symptoms with Cortisone injections. Her left knee pain is getting progressively worse over time. Left knee bothers her more than the right. Both are problematic though. She is at a stage where it is hurting all the time. It is limiting what she can and cannot do. Cortisone injections have not helped at all. She was told in the past viscosupplements will not be of benefit because her arthritis was too far gone. She is ready to get the knee fixed. They have been treated conservatively in the past for the above stated problem and despite conservative measures, they continue to have progressive pain and severe functional limitations and dysfunction. They have failed non-operative management including home exercise, medications, and injections. It is felt that they would benefit from undergoing total joint replacement. Risks and benefits of the procedure have been discussed with the patient and they elect to proceed with surgery. There are no active contraindications to surgery  such as ongoing infection or rapidly progressive neurological disease.   Problem List/Past Medical Primary osteoarthritis of both knees (M17.0)  Anxiety Disorder  Depression  High blood pressure  Migraine Headache  Hypercholesterolemia  Varicose veins  Allergies No Known Drug Allergies   Family History Cancer  Father, Maternal Grandmother. child Cerebrovascular Accident  Father. Chronic Obstructive Lung Disease  Mother. Diabetes Mellitus  Sister. First Degree Relatives  reported Hypertension  Father, Mother. Osteoporosis  Mother.  Social History Children  0 Current work status  working full time Exercise  Exercises weekly; does other Former drinker  11/10/2015: In the past drank wine only occasionally per week Living situation  live with partner Marital status  single No history of drug/alcohol rehab  Not under pain contract  Number of flights of stairs before winded  2-3 Tobacco use  Never smoker. 11/10/2015  Medication History Xanax (0.5MG  Tablet, Oral) Active. BusPIRone HCl (7.5MG  Tablet, Oral) Active. Potassium Chloride (20MEQ Tablet ER, Oral) Active. Triamterene-HCTZ (Oral) Specific strength unknown - Active. Atorvastatin Calcium (20MG  Tablet, Oral) Active. IBUPROFEN - this medication is not being screened (ONE BID, Taken starting 10/29/1997) Active. (JPA/BWS [id number])  Past Surgical History Cataract Surgery  bilateral Gallbladder Surgery  laporoscopic Rotator Cuff Repair  bilateral   Review of Systems  General Not Present- Chills, Fatigue, Fever, Memory Loss, Night Sweats, Weight Gain and Weight Loss. Skin Not Present- Eczema, Hives, Itching, Lesions and Rash. HEENT Present- Headache. Not Present- Dentures, Double Vision, Hearing Loss, Tinnitus and Visual Loss. Respiratory Not Present- Allergies, Chronic Cough, Coughing up blood, Shortness of breath at rest and Shortness of breath with exertion. Cardiovascular Not Present-  Chest Pain, Difficulty  Breathing Lying Down, Murmur, Palpitations, Racing/skipping heartbeats and Swelling. Gastrointestinal Not Present- Abdominal Pain, Bloody Stool, Constipation, Diarrhea, Difficulty Swallowing, Heartburn, Jaundice, Loss of appetitie, Nausea and Vomiting. Female Genitourinary Not Present- Blood in Urine, Discharge, Flank Pain, Incontinence, Painful Urination, Urgency, Urinary frequency, Urinary Retention, Urinating at Night and Weak urinary stream. Musculoskeletal Present- Morning Stiffness, Muscle Weakness and Spasms. Not Present- Back Pain, Joint Pain, Joint Swelling and Muscle Pain. Neurological Not Present- Blackout spells, Difficulty with balance, Dizziness, Paralysis, Tremor and Weakness. Psychiatric Not Present- Insomnia.  Vitals Weight: 213 lb Height: 67in Weight was reported by patient. Height was reported by patient. Body Surface Area: 2.08 m Body Mass Index: 33.36 kg/m  Pulse: 68 (Regular)  BP: 152/88 (Sitting, Right Arm, Standard   Physical Exam General Mental Status -Alert, cooperative and good historian. General Appearance-pleasant, Not in acute distress. Orientation-Oriented X3. Build & Nutrition-Well nourished and Well developed.  Head and Neck Head-normocephalic, atraumatic . Neck Global Assessment - supple, no bruit auscultated on the right, no bruit auscultated on the left.  Eye Vision-Decreased(Legally blind in right eye) and Wears corrective lenses. Pupil - Bilateral-Regular(left eye) and Round(lewft eye). Motion - Bilateral-EOMI.  Chest and Lung Exam Auscultation Breath sounds - clear at anterior chest wall and clear at posterior chest wall. Adventitious sounds - No Adventitious sounds.  Cardiovascular Auscultation Rhythm - Regular rate and rhythm. Heart Sounds - S1 WNL and S2 WNL. Murmurs & Other Heart Sounds - Auscultation of the heart reveals - No Murmurs.  Abdomen Palpation/Percussion Tenderness -  Abdomen is non-tender to palpation. Rigidity (guarding) - Abdomen is soft. Auscultation Auscultation of the abdomen reveals - Bowel sounds normal.  Female Genitourinary Note: Not done, not pertinent to present illness   Musculoskeletal Note: She is alert and oriented, in no apparent distress. Her hips show normal range of motion with no discomfort. Left knee, no effusion. Slight varus, ranged 10 to 120. Marked crepitus on range of motion. Tender medial greater than lateral with no instability noted. Right knee, no effusion. Ranged 5 to 125, moderate crepitus on range of motion. Tender medial greater lateral with no instability. Pulse, sensation, and motor intact distally. She has significantly antalgic gait pattern.  RADIOGRAPHS AP both knees and lateral on the left show bone-on-bone arthritis, medial and patellofemoral with significant varus deformity. We also radiographed AP of the right knee given that it was symptomatic and she also has bone-on-bone arthritis, medial and patellofemoral right knee. The left knee is worse radiographically and symptomatically.  Assessment & Plan Primary osteoarthritis of left knee (M17.12)  Note:Surgical Plans: Left Total Knee Replacement  Disposition: Home with help, Start with Home [facility] PT following hospital stay  PCP: Dr. Jessie Foot  IV TXA  Anesthesia Issues: None  Patient was instructed on what medications to stop prior to surgery.  Signed electronically by Joelene Millin, III PA-C

## 2016-12-26 ENCOUNTER — Encounter (HOSPITAL_COMMUNITY)
Admission: RE | Disposition: A | Discharge status: Home Health | Payer: Self-pay | Source: Ambulatory Visit | Attending: Orthopedic Surgery

## 2016-12-26 ENCOUNTER — Inpatient Hospital Stay (HOSPITAL_COMMUNITY): Payer: BLUE CROSS/BLUE SHIELD | Admitting: Anesthesiology

## 2016-12-26 ENCOUNTER — Inpatient Hospital Stay (HOSPITAL_COMMUNITY)
Admission: RE | Admit: 2016-12-26 | Discharge: 2016-12-28 | DRG: 470 | Disposition: A | Discharge status: Home Health | Payer: BLUE CROSS/BLUE SHIELD | Source: Ambulatory Visit | Attending: Orthopedic Surgery | Admitting: Orthopedic Surgery

## 2016-12-26 ENCOUNTER — Encounter (HOSPITAL_COMMUNITY): Payer: Self-pay | Admitting: *Deleted

## 2016-12-26 DIAGNOSIS — Z973 Presence of spectacles and contact lenses: Secondary | ICD-10-CM | POA: Diagnosis not present

## 2016-12-26 DIAGNOSIS — M25762 Osteophyte, left knee: Secondary | ICD-10-CM | POA: Diagnosis present

## 2016-12-26 DIAGNOSIS — Z9049 Acquired absence of other specified parts of digestive tract: Secondary | ICD-10-CM

## 2016-12-26 DIAGNOSIS — E7800 Pure hypercholesterolemia, unspecified: Secondary | ICD-10-CM | POA: Diagnosis present

## 2016-12-26 DIAGNOSIS — F329 Major depressive disorder, single episode, unspecified: Secondary | ICD-10-CM | POA: Diagnosis not present

## 2016-12-26 DIAGNOSIS — E785 Hyperlipidemia, unspecified: Secondary | ICD-10-CM | POA: Diagnosis present

## 2016-12-26 DIAGNOSIS — M1712 Unilateral primary osteoarthritis, left knee: Principal | ICD-10-CM | POA: Diagnosis present

## 2016-12-26 DIAGNOSIS — E669 Obesity, unspecified: Secondary | ICD-10-CM | POA: Diagnosis present

## 2016-12-26 DIAGNOSIS — F419 Anxiety disorder, unspecified: Secondary | ICD-10-CM | POA: Diagnosis not present

## 2016-12-26 DIAGNOSIS — M1710 Unilateral primary osteoarthritis, unspecified knee: Secondary | ICD-10-CM | POA: Diagnosis present

## 2016-12-26 DIAGNOSIS — Z6833 Body mass index (BMI) 33.0-33.9, adult: Secondary | ICD-10-CM

## 2016-12-26 DIAGNOSIS — G8918 Other acute postprocedural pain: Secondary | ICD-10-CM | POA: Diagnosis not present

## 2016-12-26 DIAGNOSIS — I10 Essential (primary) hypertension: Secondary | ICD-10-CM | POA: Diagnosis present

## 2016-12-26 DIAGNOSIS — Z79899 Other long term (current) drug therapy: Secondary | ICD-10-CM | POA: Diagnosis not present

## 2016-12-26 DIAGNOSIS — H5461 Unqualified visual loss, right eye, normal vision left eye: Secondary | ICD-10-CM | POA: Diagnosis not present

## 2016-12-26 DIAGNOSIS — Z22322 Carrier or suspected carrier of Methicillin resistant Staphylococcus aureus: Secondary | ICD-10-CM | POA: Diagnosis not present

## 2016-12-26 DIAGNOSIS — M179 Osteoarthritis of knee, unspecified: Secondary | ICD-10-CM

## 2016-12-26 HISTORY — PX: TOTAL KNEE ARTHROPLASTY: SHX125

## 2016-12-26 LAB — TYPE AND SCREEN
ABO/RH(D): O POS
ANTIBODY SCREEN: NEGATIVE

## 2016-12-26 SURGERY — ARTHROPLASTY, KNEE, TOTAL
Anesthesia: Spinal | Site: Knee | Laterality: Left

## 2016-12-26 MED ORDER — TRIAMTERENE-HCTZ 75-50 MG PO TABS
0.5000 | ORAL_TABLET | Freq: Every day | ORAL | Status: DC
  Administered 2016-12-27: 0.5 via ORAL
  Filled 2016-12-26: qty 0.5

## 2016-12-26 MED ORDER — SODIUM CHLORIDE 0.9 % IR SOLN
Status: DC | PRN
  Administered 2016-12-26: 1000 mL

## 2016-12-26 MED ORDER — FLEET ENEMA 7-19 GM/118ML RE ENEM: 1.0000 | ENEMA | Freq: Once | RECTAL | Status: DC | PRN

## 2016-12-26 MED ORDER — SODIUM CHLORIDE 0.9 % IV SOLN
INTRAVENOUS | Status: DC
  Administered 2016-12-27: 05:00:00 via INTRAVENOUS

## 2016-12-26 MED ORDER — BISACODYL 10 MG RE SUPP: 10.0000 mg | Freq: Every day | RECTAL | Status: DC | PRN

## 2016-12-26 MED ORDER — PROMETHAZINE HCL 25 MG/ML IJ SOLN: 6.2500 mg | INTRAMUSCULAR | Status: DC | PRN

## 2016-12-26 MED ORDER — ONDANSETRON HCL 4 MG/2ML IJ SOLN
INTRAMUSCULAR | Status: DC | PRN
  Administered 2016-12-26: 4 mg via INTRAVENOUS

## 2016-12-26 MED ORDER — BUPIVACAINE IN DEXTROSE 0.75-8.25 % IT SOLN
INTRATHECAL | Status: DC | PRN
  Administered 2016-12-26: 2 mL via INTRATHECAL

## 2016-12-26 MED ORDER — PROPOFOL 10 MG/ML IV BOLUS
INTRAVENOUS | Status: AC
  Filled 2016-12-26: qty 40

## 2016-12-26 MED ORDER — ACETAMINOPHEN 325 MG PO TABS
650.0000 mg | ORAL_TABLET | Freq: Four times a day (QID) | ORAL | Status: DC | PRN
  Administered 2016-12-27 – 2016-12-28 (×3): 650 mg via ORAL
  Filled 2016-12-26 (×3): qty 2

## 2016-12-26 MED ORDER — TRANEXAMIC ACID 1000 MG/10ML IV SOLN
1000.0000 mg | Freq: Once | INTRAVENOUS | Status: AC
  Administered 2016-12-26: 1000 mg via INTRAVENOUS
  Filled 2016-12-26: qty 1100

## 2016-12-26 MED ORDER — FENTANYL CITRATE (PF) 100 MCG/2ML IJ SOLN
INTRAMUSCULAR | Status: AC
  Filled 2016-12-26: qty 2

## 2016-12-26 MED ORDER — FLUTICASONE PROPIONATE 50 MCG/ACT NA SUSP
1.0000 | Freq: Every day | NASAL | Status: DC | PRN
  Filled 2016-12-26: qty 16

## 2016-12-26 MED ORDER — ALPRAZOLAM 0.5 MG PO TABS
0.5000 mg | ORAL_TABLET | Freq: Two times a day (BID) | ORAL | Status: DC | PRN
  Administered 2016-12-28: 0.5 mg via ORAL

## 2016-12-26 MED ORDER — MIDAZOLAM HCL 5 MG/5ML IJ SOLN
INTRAMUSCULAR | Status: DC | PRN
  Administered 2016-12-26: 2 mg via INTRAVENOUS

## 2016-12-26 MED ORDER — ACETAMINOPHEN 10 MG/ML IV SOLN
INTRAVENOUS | Status: AC
  Filled 2016-12-26: qty 100

## 2016-12-26 MED ORDER — VANCOMYCIN HCL 1000 MG IV SOLR
1000.0000 mg | Freq: Once | INTRAVENOUS | Status: AC
  Administered 2016-12-26: 1000 mg via INTRAVENOUS
  Filled 2016-12-26: qty 1000

## 2016-12-26 MED ORDER — ACETAMINOPHEN 10 MG/ML IV SOLN
1000.0000 mg | Freq: Once | INTRAVENOUS | Status: AC
  Administered 2016-12-26: 1000 mg via INTRAVENOUS

## 2016-12-26 MED ORDER — MEPERIDINE HCL 50 MG/ML IJ SOLN: 6.2500 mg | INTRAMUSCULAR | Status: DC | PRN

## 2016-12-26 MED ORDER — METOCLOPRAMIDE HCL 5 MG PO TABS: 5.0000 mg | ORAL_TABLET | Freq: Three times a day (TID) | ORAL | Status: DC | PRN

## 2016-12-26 MED ORDER — BUPIVACAINE LIPOSOME 1.3 % IJ SUSP
20.0000 mL | Freq: Once | INTRAMUSCULAR | Status: DC
  Filled 2016-12-26: qty 20

## 2016-12-26 MED ORDER — CEFAZOLIN SODIUM-DEXTROSE 2-4 GM/100ML-% IV SOLN
INTRAVENOUS | Status: AC
  Filled 2016-12-26: qty 100

## 2016-12-26 MED ORDER — ONDANSETRON HCL 4 MG/2ML IJ SOLN
INTRAMUSCULAR | Status: AC
  Filled 2016-12-26: qty 2

## 2016-12-26 MED ORDER — BUPROPION HCL ER (XL) 300 MG PO TB24
300.0000 mg | ORAL_TABLET | Freq: Every day | ORAL | Status: DC
  Administered 2016-12-27 – 2016-12-28 (×2): 300 mg via ORAL
  Filled 2016-12-26 (×2): qty 1

## 2016-12-26 MED ORDER — ACETAMINOPHEN 500 MG PO TABS
1000.0000 mg | ORAL_TABLET | Freq: Four times a day (QID) | ORAL | Status: AC
  Administered 2016-12-26 – 2016-12-27 (×4): 1000 mg via ORAL
  Filled 2016-12-26 (×4): qty 2

## 2016-12-26 MED ORDER — SODIUM CHLORIDE 0.9 % IJ SOLN
INTRAMUSCULAR | Status: DC | PRN
  Administered 2016-12-26: 30 mL

## 2016-12-26 MED ORDER — BUPIVACAINE LIPOSOME 1.3 % IJ SUSP
INTRAMUSCULAR | Status: DC | PRN
  Administered 2016-12-26: 20 mL

## 2016-12-26 MED ORDER — DIPHENHYDRAMINE HCL 12.5 MG/5ML PO ELIX: 12.5000 mg | ORAL_SOLUTION | ORAL | Status: DC | PRN

## 2016-12-26 MED ORDER — TRANEXAMIC ACID 1000 MG/10ML IV SOLN
1000.0000 mg | INTRAVENOUS | Status: AC
  Administered 2016-12-26: 1000 mg via INTRAVENOUS
  Filled 2016-12-26: qty 1100

## 2016-12-26 MED ORDER — METOCLOPRAMIDE HCL 5 MG/ML IJ SOLN: 5.0000 mg | Freq: Three times a day (TID) | INTRAMUSCULAR | Status: DC | PRN

## 2016-12-26 MED ORDER — POLYETHYLENE GLYCOL 3350 17 G PO PACK: 17.0000 g | PACK | Freq: Every day | ORAL | Status: DC | PRN

## 2016-12-26 MED ORDER — MENTHOL 3 MG MT LOZG: 1.0000 | LOZENGE | OROMUCOSAL | Status: DC | PRN

## 2016-12-26 MED ORDER — DOCUSATE SODIUM 100 MG PO CAPS
100.0000 mg | ORAL_CAPSULE | Freq: Two times a day (BID) | ORAL | Status: DC
  Administered 2016-12-26 – 2016-12-28 (×4): 100 mg via ORAL
  Filled 2016-12-26 (×4): qty 1

## 2016-12-26 MED ORDER — OXYCODONE HCL 5 MG PO TABS
5.0000 mg | ORAL_TABLET | ORAL | Status: DC | PRN
Start: 2016-12-26 — End: 2016-12-28
  Administered 2016-12-26 – 2016-12-27 (×3): 10 mg via ORAL
  Administered 2016-12-27: 5 mg via ORAL
  Administered 2016-12-27 – 2016-12-28 (×5): 10 mg via ORAL
  Filled 2016-12-26: qty 1
  Filled 2016-12-26 (×8): qty 2

## 2016-12-26 MED ORDER — CEFAZOLIN SODIUM-DEXTROSE 2-4 GM/100ML-% IV SOLN
2.0000 g | INTRAVENOUS | Status: AC
  Administered 2016-12-26: 2 g via INTRAVENOUS

## 2016-12-26 MED ORDER — PHENYLEPHRINE HCL 10 MG/ML IJ SOLN
INTRAMUSCULAR | Status: AC
  Filled 2016-12-26: qty 1

## 2016-12-26 MED ORDER — PROPOFOL 10 MG/ML IV BOLUS
INTRAVENOUS | Status: AC
  Filled 2016-12-26: qty 20

## 2016-12-26 MED ORDER — RIVAROXABAN 10 MG PO TABS
10.0000 mg | ORAL_TABLET | Freq: Every day | ORAL | Status: DC
  Administered 2016-12-27 – 2016-12-28 (×2): 10 mg via ORAL
  Filled 2016-12-26 (×2): qty 1

## 2016-12-26 MED ORDER — PHENOL 1.4 % MT LIQD
1.0000 | OROMUCOSAL | Status: DC | PRN
  Filled 2016-12-26: qty 177

## 2016-12-26 MED ORDER — PROPOFOL 500 MG/50ML IV EMUL
INTRAVENOUS | Status: DC | PRN
  Administered 2016-12-26: 75 ug/kg/min via INTRAVENOUS

## 2016-12-26 MED ORDER — LACTATED RINGERS IV SOLN
INTRAVENOUS | Status: DC
  Administered 2016-12-26 (×2): via INTRAVENOUS

## 2016-12-26 MED ORDER — HYDROMORPHONE HCL 1 MG/ML IJ SOLN: 0.2500 mg | INTRAMUSCULAR | Status: DC | PRN

## 2016-12-26 MED ORDER — SODIUM CHLORIDE 0.9 % IJ SOLN
INTRAMUSCULAR | Status: AC
  Filled 2016-12-26: qty 50

## 2016-12-26 MED ORDER — FENTANYL CITRATE (PF) 100 MCG/2ML IJ SOLN
INTRAMUSCULAR | Status: DC | PRN
  Administered 2016-12-26: 100 ug via INTRAVENOUS

## 2016-12-26 MED ORDER — ONDANSETRON HCL 4 MG/2ML IJ SOLN
4.0000 mg | Freq: Four times a day (QID) | INTRAMUSCULAR | Status: DC | PRN
  Administered 2016-12-27: 4 mg via INTRAVENOUS
  Filled 2016-12-26: qty 2

## 2016-12-26 MED ORDER — STERILE WATER FOR IRRIGATION IR SOLN
Status: DC | PRN
  Administered 2016-12-26: 2000 mL

## 2016-12-26 MED ORDER — CHLORHEXIDINE GLUCONATE 4 % EX LIQD: 60.0000 mL | Freq: Once | CUTANEOUS | Status: DC

## 2016-12-26 MED ORDER — MORPHINE SULFATE (PF) 4 MG/ML IV SOLN
1.0000 mg | INTRAVENOUS | Status: DC | PRN
  Administered 2016-12-27: 1 mg via INTRAVENOUS
  Filled 2016-12-26: qty 1

## 2016-12-26 MED ORDER — DEXAMETHASONE SODIUM PHOSPHATE 10 MG/ML IJ SOLN
10.0000 mg | Freq: Once | INTRAMUSCULAR | Status: AC
  Administered 2016-12-27: 09:00:00 10 mg via INTRAVENOUS
  Filled 2016-12-26: qty 1

## 2016-12-26 MED ORDER — PROPOFOL 500 MG/50ML IV EMUL
INTRAVENOUS | Status: DC | PRN
  Administered 2016-12-26 (×2): 20 mg via INTRAVENOUS
  Administered 2016-12-26: 30 mg via INTRAVENOUS

## 2016-12-26 MED ORDER — BUPIVACAINE-EPINEPHRINE (PF) 0.5% -1:200000 IJ SOLN
INTRAMUSCULAR | Status: DC | PRN
  Administered 2016-12-26: 30 mL via PERINEURAL

## 2016-12-26 MED ORDER — MIDAZOLAM HCL 2 MG/2ML IJ SOLN
INTRAMUSCULAR | Status: AC
  Filled 2016-12-26: qty 2

## 2016-12-26 MED ORDER — ATORVASTATIN CALCIUM 20 MG PO TABS
20.0000 mg | ORAL_TABLET | Freq: Every day | ORAL | Status: DC
  Administered 2016-12-26 – 2016-12-27 (×2): 20 mg via ORAL
  Filled 2016-12-26 (×2): qty 1

## 2016-12-26 MED ORDER — METHOCARBAMOL 1000 MG/10ML IJ SOLN
500.0000 mg | Freq: Four times a day (QID) | INTRAMUSCULAR | Status: DC | PRN
  Administered 2016-12-26: 500 mg via INTRAVENOUS
  Filled 2016-12-26: qty 550
  Filled 2016-12-26: qty 5

## 2016-12-26 MED ORDER — DEXAMETHASONE SODIUM PHOSPHATE 10 MG/ML IJ SOLN
INTRAMUSCULAR | Status: AC
  Filled 2016-12-26: qty 1

## 2016-12-26 MED ORDER — DEXTROSE 5 % IV SOLN
INTRAVENOUS | Status: DC | PRN
  Administered 2016-12-26: 25 ug/min via INTRAVENOUS

## 2016-12-26 MED ORDER — METHOCARBAMOL 500 MG PO TABS
500.0000 mg | ORAL_TABLET | Freq: Four times a day (QID) | ORAL | Status: DC | PRN
  Administered 2016-12-27 – 2016-12-28 (×3): 500 mg via ORAL
  Filled 2016-12-26 (×4): qty 1

## 2016-12-26 MED ORDER — DEXAMETHASONE SODIUM PHOSPHATE 10 MG/ML IJ SOLN
10.0000 mg | Freq: Once | INTRAMUSCULAR | Status: AC
  Administered 2016-12-26: 10 mg via INTRAVENOUS

## 2016-12-26 MED ORDER — VANCOMYCIN HCL IN DEXTROSE 1-5 GM/200ML-% IV SOLN
INTRAVENOUS | Status: AC
  Filled 2016-12-26: qty 200

## 2016-12-26 MED ORDER — ACETAMINOPHEN 650 MG RE SUPP: 650.0000 mg | Freq: Four times a day (QID) | RECTAL | Status: DC | PRN

## 2016-12-26 MED ORDER — ONDANSETRON HCL 4 MG PO TABS: 4.0000 mg | ORAL_TABLET | Freq: Four times a day (QID) | ORAL | Status: DC | PRN

## 2016-12-26 MED ORDER — POTASSIUM CHLORIDE CRYS ER 20 MEQ PO TBCR
20.0000 meq | EXTENDED_RELEASE_TABLET | Freq: Every day | ORAL | Status: DC
  Administered 2016-12-27 – 2016-12-28 (×2): 20 meq via ORAL
  Filled 2016-12-26 (×2): qty 1

## 2016-12-26 MED ORDER — CEFAZOLIN SODIUM-DEXTROSE 2-4 GM/100ML-% IV SOLN
2.0000 g | Freq: Four times a day (QID) | INTRAVENOUS | Status: AC
  Administered 2016-12-26 – 2016-12-27 (×2): 2 g via INTRAVENOUS
  Filled 2016-12-26 (×2): qty 100

## 2016-12-26 SURGICAL SUPPLY — 49 items
BAG DECANTER FOR FLEXI CONT (MISCELLANEOUS) ×3 IMPLANT
BAG SPEC THK2 15X12 ZIP CLS (MISCELLANEOUS) ×1
BAG ZIPLOCK 12X15 (MISCELLANEOUS) ×3 IMPLANT
BANDAGE ACE 6X5 VEL STRL LF (GAUZE/BANDAGES/DRESSINGS) ×3 IMPLANT
BLADE SAG 18X100X1.27 (BLADE) ×3 IMPLANT
BLADE SAW SGTL 11.0X1.19X90.0M (BLADE) ×3 IMPLANT
BOWL SMART MIX CTS (DISPOSABLE) ×3 IMPLANT
CAPT KNEE TOTAL 3 ATTUNE ×2 IMPLANT
CEMENT HV SMART SET (Cement) ×6 IMPLANT
CLOSURE WOUND 1/2 X4 (GAUZE/BANDAGES/DRESSINGS) ×1
CUFF TOURN SGL QUICK 34 (TOURNIQUET CUFF) ×3
CUFF TRNQT CYL 34X4X40X1 (TOURNIQUET CUFF) ×1 IMPLANT
DECANTER SPIKE VIAL GLASS SM (MISCELLANEOUS) ×3 IMPLANT
DRAPE U-SHAPE 47X51 STRL (DRAPES) ×3 IMPLANT
DRSG ADAPTIC 3X8 NADH LF (GAUZE/BANDAGES/DRESSINGS) ×3 IMPLANT
DRSG PAD ABDOMINAL 8X10 ST (GAUZE/BANDAGES/DRESSINGS) ×3 IMPLANT
DURAPREP 26ML APPLICATOR (WOUND CARE) ×3 IMPLANT
ELECT REM PT RETURN 15FT ADLT (MISCELLANEOUS) ×3 IMPLANT
EVACUATOR 1/8 PVC DRAIN (DRAIN) ×3 IMPLANT
GAUZE SPONGE 4X4 12PLY STRL (GAUZE/BANDAGES/DRESSINGS) ×3 IMPLANT
GLOVE BIO SURGEON STRL SZ7.5 (GLOVE) ×2 IMPLANT
GLOVE BIO SURGEON STRL SZ8 (GLOVE) ×5 IMPLANT
GLOVE BIOGEL PI IND STRL 6.5 (GLOVE) IMPLANT
GLOVE BIOGEL PI IND STRL 8 (GLOVE) ×1 IMPLANT
GLOVE BIOGEL PI INDICATOR 6.5 (GLOVE) ×4
GLOVE BIOGEL PI INDICATOR 8 (GLOVE) ×2
GLOVE SURG SS PI 6.5 STRL IVOR (GLOVE) ×4 IMPLANT
GOWN STRL REUS W/TWL LRG LVL3 (GOWN DISPOSABLE) ×3 IMPLANT
GOWN STRL REUS W/TWL XL LVL3 (GOWN DISPOSABLE) ×2 IMPLANT
HANDPIECE INTERPULSE COAX TIP (DISPOSABLE) ×3
IMMOBILIZER KNEE 20 (SOFTGOODS) ×3
IMMOBILIZER KNEE 20 THIGH 36 (SOFTGOODS) ×1 IMPLANT
MANIFOLD NEPTUNE II (INSTRUMENTS) ×3 IMPLANT
NS IRRIG 1000ML POUR BTL (IV SOLUTION) ×3 IMPLANT
PACK TOTAL KNEE CUSTOM (KITS) ×3 IMPLANT
PADDING CAST COTTON 6X4 STRL (CAST SUPPLIES) ×5 IMPLANT
POSITIONER SURGICAL ARM (MISCELLANEOUS) ×3 IMPLANT
SET HNDPC FAN SPRY TIP SCT (DISPOSABLE) ×1 IMPLANT
STRIP CLOSURE SKIN 1/2X4 (GAUZE/BANDAGES/DRESSINGS) ×3 IMPLANT
SUT MNCRL AB 4-0 PS2 18 (SUTURE) ×3 IMPLANT
SUT STRATAFIX 0 PDS 27 VIOLET (SUTURE) ×3
SUT VIC AB 2-0 CT1 27 (SUTURE) ×9
SUT VIC AB 2-0 CT1 TAPERPNT 27 (SUTURE) ×3 IMPLANT
SUTURE STRATFX 0 PDS 27 VIOLET (SUTURE) ×1 IMPLANT
SYR 50ML LL SCALE MARK (SYRINGE) IMPLANT
TRAY FOLEY CATH 14FRSI W/METER (CATHETERS) ×2 IMPLANT
WATER STERILE IRR 1000ML POUR (IV SOLUTION) ×6 IMPLANT
WRAP KNEE MAXI GEL POST OP (GAUZE/BANDAGES/DRESSINGS) ×3 IMPLANT
YANKAUER SUCT BULB TIP 10FT TU (MISCELLANEOUS) ×3 IMPLANT

## 2016-12-26 NOTE — H&P (View-Only) (Signed)
Anne [name] DOB: [birth year] Single / Language: English / Race: White Female Date of Admission:  12/26/2016 CC: Left Knee Pain History of Present Illness  The patient is a 61 year old female who comes in for a preoperative History and Physical. The patient is scheduled for a left total knee arthroplasty to be performed by Dr. Dione Plover. Aluisio, MD at [facility] on 12-26-2016. The patient is being followed for their bilateral knee pain and osteoarthritis. They havbe had a left knee cortisone injection in the past. Symptoms reported include: pain, pain after sitting, pain at night, aching, giving way, instability, pain with weightbearing, difficulty ambulating and difficulty arising from chair. The patient feels that they are doing poorly and report their pain level to be moderate to severe. The following medication has been used for pain control: antiinflammatory medication (Motrin). The patient has not gotten any relief of their symptoms with Cortisone injections. Her left knee pain is getting progressively worse over time. Left knee bothers her more than the right. Both are problematic though. She is at a stage where it is hurting all the time. It is limiting what she can and cannot do. Cortisone injections have not helped at all. She was told in the past viscosupplements will not be of benefit because her arthritis was too far gone. She is ready to get the knee fixed. They have been treated conservatively in the past for the above stated problem and despite conservative measures, they continue to have progressive pain and severe functional limitations and dysfunction. They have failed non-operative management including home exercise, medications, and injections. It is felt that they would benefit from undergoing total joint replacement. Risks and benefits of the procedure have been discussed with the patient and they elect to proceed with surgery. There are no active contraindications to surgery  such as ongoing infection or rapidly progressive neurological disease.   Problem List/Past Medical Primary osteoarthritis of both knees (M17.0)  Anxiety Disorder  Depression  High blood pressure  Migraine Headache  Hypercholesterolemia  Varicose veins  Allergies No Known Drug Allergies   Family History Cancer  Father, Maternal Grandmother. child Cerebrovascular Accident  Father. Chronic Obstructive Lung Disease  Mother. Diabetes Mellitus  Sister. First Degree Relatives  reported Hypertension  Father, Mother. Osteoporosis  Mother.  Social History Children  0 Current work status  working full time Exercise  Exercises weekly; does other Former drinker  11/10/2015: In the past drank wine only occasionally per week Living situation  live with partner Marital status  single No history of drug/alcohol rehab  Not under pain contract  Number of flights of stairs before winded  2-3 Tobacco use  Never smoker. 11/10/2015  Medication History Xanax (0.5MG  Tablet, Oral) Active. BusPIRone HCl (7.5MG  Tablet, Oral) Active. Potassium Chloride (20MEQ Tablet ER, Oral) Active. Triamterene-HCTZ (Oral) Specific strength unknown - Active. Atorvastatin Calcium (20MG  Tablet, Oral) Active. IBUPROFEN - this medication is not being screened (ONE BID, Taken starting 10/29/1997) Active. (JPA/BWS [id number])  Past Surgical History Cataract Surgery  bilateral Gallbladder Surgery  laporoscopic Rotator Cuff Repair  bilateral   Review of Systems  General Not Present- Chills, Fatigue, Fever, Memory Loss, Night Sweats, Weight Gain and Weight Loss. Skin Not Present- Eczema, Hives, Itching, Lesions and Rash. HEENT Present- Headache. Not Present- Dentures, Double Vision, Hearing Loss, Tinnitus and Visual Loss. Respiratory Not Present- Allergies, Chronic Cough, Coughing up blood, Shortness of breath at rest and Shortness of breath with exertion. Cardiovascular Not Present-  Chest Pain, Difficulty  Breathing Lying Down, Murmur, Palpitations, Racing/skipping heartbeats and Swelling. Gastrointestinal Not Present- Abdominal Pain, Bloody Stool, Constipation, Diarrhea, Difficulty Swallowing, Heartburn, Jaundice, Loss of appetitie, Nausea and Vomiting. Female Genitourinary Not Present- Blood in Urine, Discharge, Flank Pain, Incontinence, Painful Urination, Urgency, Urinary frequency, Urinary Retention, Urinating at Night and Weak urinary stream. Musculoskeletal Present- Morning Stiffness, Muscle Weakness and Spasms. Not Present- Back Pain, Joint Pain, Joint Swelling and Muscle Pain. Neurological Not Present- Blackout spells, Difficulty with balance, Dizziness, Paralysis, Tremor and Weakness. Psychiatric Not Present- Insomnia.  Vitals Weight: 213 lb Height: 67in Weight was reported by patient. Height was reported by patient. Body Surface Area: 2.08 m Body Mass Index: 33.36 kg/m  Pulse: 68 (Regular)  BP: 152/88 (Sitting, Right Arm, Standard   Physical Exam General Mental Status -Alert, cooperative and good historian. General Appearance-pleasant, Not in acute distress. Orientation-Oriented X3. Build & Nutrition-Well nourished and Well developed.  Head and Neck Head-normocephalic, atraumatic . Neck Global Assessment - supple, no bruit auscultated on the right, no bruit auscultated on the left.  Eye Vision-Decreased(Legally blind in right eye) and Wears corrective lenses. Pupil - Bilateral-Regular(left eye) and Round(lewft eye). Motion - Bilateral-EOMI.  Chest and Lung Exam Auscultation Breath sounds - clear at anterior chest wall and clear at posterior chest wall. Adventitious sounds - No Adventitious sounds.  Cardiovascular Auscultation Rhythm - Regular rate and rhythm. Heart Sounds - S1 WNL and S2 WNL. Murmurs & Other Heart Sounds - Auscultation of the heart reveals - No Murmurs.  Abdomen Palpation/Percussion Tenderness -  Abdomen is non-tender to palpation. Rigidity (guarding) - Abdomen is soft. Auscultation Auscultation of the abdomen reveals - Bowel sounds normal.  Female Genitourinary Note: Not done, not pertinent to present illness   Musculoskeletal Note: She is alert and oriented, in no apparent distress. Her hips show normal range of motion with no discomfort. Left knee, no effusion. Slight varus, ranged 10 to 120. Marked crepitus on range of motion. Tender medial greater than lateral with no instability noted. Right knee, no effusion. Ranged 5 to 125, moderate crepitus on range of motion. Tender medial greater lateral with no instability. Pulse, sensation, and motor intact distally. She has significantly antalgic gait pattern.  RADIOGRAPHS AP both knees and lateral on the left show bone-on-bone arthritis, medial and patellofemoral with significant varus deformity. We also radiographed AP of the right knee given that it was symptomatic and she also has bone-on-bone arthritis, medial and patellofemoral right knee. The left knee is worse radiographically and symptomatically.  Assessment & Plan Primary osteoarthritis of left knee (M17.12)  Note:Surgical Plans: Left Total Knee Replacement  Disposition: Home with help, Start with Home [facility] PT following hospital stay  PCP: Dr. Jessie Foot  IV TXA  Anesthesia Issues: None  Patient was instructed on what medications to stop prior to surgery.  Signed electronically by Joelene Millin, III PA-C

## 2016-12-26 NOTE — Transfer of Care (Signed)
Immediate Anesthesia Transfer of Care Note  Patient: Anne [name]  Procedure(s) Performed: Procedure(s) with comments: LEFT TOTAL KNEE ARTHROPLASTY (Left) - requests 24mns  Patient Location: PACU  Anesthesia Type:MAC and Spinal  Level of Consciousness: awake, alert  and oriented  Airway & Oxygen Therapy: Patient Spontanous Breathing and Patient connected to face mask oxygen  Post-op Assessment: Report given to RN and Post -op Vital signs reviewed and stable  Post vital signs: Reviewed and stable  Last Vitals:  Vitals:   12/26/16 1354 12/26/16 1356  BP:    Pulse: 77 80  Resp: 11 (!) 29  Temp:      Last Pain:  Vitals:   12/26/16 1320  TempSrc: Oral      Patients Stated Pain Goal: 3 ([id number]  Complications: No apparent anesthesia complications

## 2016-12-26 NOTE — Anesthesia Postprocedure Evaluation (Signed)
Anesthesia Post Note  Patient: Anne [name]  Procedure(s) Performed: Procedure(s) (LRB): LEFT TOTAL KNEE ARTHROPLASTY (Left)  Patient location during evaluation: PACU Anesthesia Type: Spinal Level of consciousness: oriented and awake and alert Pain management: pain level controlled Vital Signs Assessment: post-procedure vital signs reviewed and stable Respiratory status: spontaneous breathing, respiratory function stable and nonlabored ventilation Cardiovascular status: blood pressure returned to baseline and stable Postop Assessment: no headache, no backache, spinal receding, no signs of nausea or vomiting and patient able to bend at knees Anesthetic complications: no       Last Vitals:  Vitals:   12/26/16 1615 12/26/16 1630  BP: 136/74 137/71  Pulse: 67 66  Resp: 13 15  Temp:      Last Pain:  Vitals:   12/26/16 1630  TempSrc:   PainSc: 0-No pain                 [name] A.

## 2016-12-26 NOTE — Anesthesia Procedure Notes (Addendum)
Anesthesia Regional Block: Adductor canal block   Pre-Anesthetic Checklist: ,, timeout performed, Correct Patient, Correct Site, Correct Laterality, Correct Procedure, Correct Position, site marked, Risks and benefits discussed,  Surgical consent,  Pre-op evaluation,  At surgeon's request and post-op pain management  Laterality: Left  Prep: chloraprep       Needles:  Injection technique: Single-shot  Needle Type: Echogenic Stimulator Needle     Needle Length: 9cm  Needle Gauge: 21   Needle insertion depth: 5 cm   Additional Needles:   Procedures: ultrasound guided,,,,,,,,  Narrative:  Start time: 12/26/2016 1:49 PM End time: 12/26/2016 1:54 PM Injection made incrementally with aspirations every 5 mL.  Performed by: Personally  Anesthesiologist: Josephine Igo  Additional Notes: Timeout performed. Patient sedated. Relevant anatomy ID'd using [place]. Incremental 56ml injection with frequent aspiration. Patient tolerated procedure well.

## 2016-12-26 NOTE — Anesthesia Procedure Notes (Signed)
Spinal  Patient location during procedure: OR Start time: 12/26/2016 2:09 PM End time: 12/26/2016 2:12 PM Staffing Anesthesiologist: Josephine Igo Resident/CRNA: Chrystine Oiler G Performed: resident/CRNA  Preanesthetic Checklist Completed: patient identified, site marked, surgical consent, pre-op evaluation, timeout performed, IV checked, risks and benefits discussed and monitors and equipment checked Spinal Block Patient position: sitting Prep: DuraPrep Patient monitoring: heart rate, continuous pulse ox and blood pressure Approach: midline Location: L2-3 Needle Needle type: Sprotte  Needle gauge: 24 G Needle length: 10 cm Needle insertion depth: 7 cm Assessment Sensory level: T6 Additional Notes Kit expiration checked, -heme,-paraesthesia, +CSF pre and post injection.  Patient tolerated well.

## 2016-12-26 NOTE — Progress Notes (Signed)
AssistedDr. Foster with left, ultrasound guided, adductor canal block. Side rails up, monitors on throughout procedure. See vital signs in flow sheet. Tolerated Procedure well.  

## 2016-12-26 NOTE — Op Note (Signed)
OPERATIVE REPORT-TOTAL KNEE ARTHROPLASTY   Pre-operative diagnosis- Osteoarthritis  Left knee(s)  Post-operative diagnosis- Osteoarthritis Left knee(s)  Procedure-  Left  Total Knee Arthroplasty (Depuy Attune)  Surgeon- Dione Plover. Starletta Houchin, MD  Assistant- Arlee Muslim, PA-C   Anesthesia-  Adductor canal block and spinal  EBL-* No blood loss amount entered *   Drains Hemovac  Tourniquet time-  Total Tourniquet Time Documented: Thigh (Left) - 34 minutes Total: Thigh (Left) - 34 minutes     Complications- None  Condition-PACU - hemodynamically stable.   Brief Clinical Note  Anne [name] is a 61 y.o. year old female with end stage OA of her left knee with progressively worsening pain and dysfunction. She has constant pain, with activity and at rest and significant functional deficits with difficulties even with ADLs. She has had extensive non-op management including analgesics, injections of cortisone and viscosupplements, and home exercise program, but remains in significant pain with significant dysfunction. Radiographs show bone on bone arthritis medial and patellofemoral.. She presents now for left Total Knee Arthroplasty.    Procedure in detail---   The patient is brought into the operating room and positioned supine on the operating table. After successful administration of  Adductor canal block and spinal,   a tourniquet is placed high on the  Left thigh(s) and the lower extremity is prepped and draped in the usual sterile fashion. Time out is performed by the operating team and then the  Left lower extremity is wrapped in Esmarch, knee flexed and the tourniquet inflated to 300 mmHg.       A midline incision is made with a ten blade through the subcutaneous tissue to the level of the extensor mechanism. A fresh blade is used to make a medial parapatellar arthrotomy. Soft tissue over the proximal medial tibia is subperiosteally elevated to the joint line with a knife and into the  semimembranosus bursa with a Cobb elevator. Soft tissue over the proximal lateral tibia is elevated with attention being paid to avoiding the patellar tendon on the tibial tubercle. The patella is everted, knee flexed 90 degrees and the ACL and PCL are removed. Findings are bone on bone medial and patellofemoral with large global osteophytes.        The drill is used to create a starting hole in the distal femur and the canal is thoroughly irrigated with sterile saline to remove the fatty contents. The 5 degree Left  valgus alignment guide is placed into the femoral canal and the distal femoral cutting block is pinned to remove 10 mm off the distal femur. Resection is made with an oscillating saw.      The tibia is subluxed forward and the menisci are removed. The extramedullary alignment guide is placed referencing proximally at the medial aspect of the tibial tubercle and distally along the second metatarsal axis and tibial crest. The block is pinned to remove 76mm off the more deficient medial  side. Resection is made with an oscillating saw. Size 5is the most appropriate size for the tibia and the proximal tibia is prepared with the modular drill and keel punch for that size.      The femoral sizing guide is placed and size 6 is most appropriate. Rotation is marked off the epicondylar axis and confirmed by creating a rectangular flexion gap at 90 degrees. The size 6 cutting block is pinned in this rotation and the anterior, posterior and chamfer cuts are made with the oscillating saw. The intercondylar block is then  placed and that cut is made.      Trial size 5 tibial component, trial size 6 posterior stabilized femur and a 10  mm posterior stabilized rotating platform insert trial is placed. Full extension is achieved with excellent varus/valgus and anterior/posterior balance throughout full range of motion. The patella is everted and thickness measured to be 22  mm. Free hand resection is taken to 12 mm, a  38 template is placed, lug holes are drilled, trial patella is placed, and it tracks normally. Osteophytes are removed off the posterior femur with the trial in place. All trials are removed and the cut bone surfaces prepared with pulsatile lavage. Cement is mixed and once ready for implantation, the size 5 tibial implant, size  6 posterior stabilized femoral component, and the size 38 patella are cemented in place and the patella is held with the clamp. The trial insert is placed and the knee held in full extension. The Exparel (20 ml mixed with 30 ml saline) is injected into the extensor mechanism, posterior capsule, medial and lateral gutters and subcutaneous tissues.  All extruded cement is removed and once the cement is hard the permanent 10 mm posterior stabilized rotating platform insert is placed into the tibial tray.      The wound is copiously irrigated with saline solution and the extensor mechanism closed over a hemovac drain with #1 V-loc suture. The tourniquet is released for a total tourniquet time of 34  minutes. Flexion against gravity is 140 degrees and the patella tracks normally. Subcutaneous tissue is closed with 2.0 vicryl and subcuticular with running 4.0 Monocryl. The incision is cleaned and dried and steri-strips and a bulky sterile dressing are applied. The limb is placed into a knee immobilizer and the patient is awakened and transported to recovery in stable condition.      Please note that a surgical assistant was a medical necessity for this procedure in order to perform it in a safe and expeditious manner. Surgical assistant was necessary to retract the ligaments and vital neurovascular structures to prevent injury to them and also necessary for proper positioning of the limb to allow for anatomic placement of the prosthesis.   Dione Plover Melena Hayes, MD    12/26/2016, 3:14 PM

## 2016-12-26 NOTE — Anesthesia Preprocedure Evaluation (Addendum)
Anesthesia Evaluation  Patient identified by MRN, date of birth, ID band Patient awake    Reviewed: Allergy & Precautions, NPO status , Patient's Chart, lab work & pertinent test results  Airway Mallampati: II  TM Distance: >3 FB Neck ROM: Full    Dental no notable dental hx. (+) Teeth Intact   Pulmonary neg pulmonary ROS,    Pulmonary exam normal breath sounds clear to auscultation       Cardiovascular hypertension, Normal cardiovascular exam Rhythm:Regular Rate:Normal     Neuro/Psych Anxiety negative neurological ROS     GI/Hepatic negative GI ROS, Neg liver ROS,   Endo/Other  Obesity Hyperlipidemia  Renal/GU negative Renal ROS  negative genitourinary   Musculoskeletal  (+) Arthritis , Osteoarthritis,  OA Left knee   Abdominal (+) + obese,   Peds  Hematology   Anesthesia Other Findings   Reproductive/Obstetrics                             Anesthesia Physical Anesthesia Plan  ASA: II  Anesthesia Plan: Spinal   Post-op Pain Management:  Regional for Post-op pain   Induction:   Airway Management Planned: Natural Airway  Additional Equipment:   Intra-op Plan:   Post-operative Plan:   Informed Consent: I have reviewed the patients History and Physical, chart, labs and discussed the procedure including the risks, benefits and alternatives for the proposed anesthesia with the patient or authorized representative who has indicated his/her understanding and acceptance.   Dental advisory given  Plan Discussed with: CRNA, Surgeon and Anesthesiologist  Anesthesia Plan Comments:         Anesthesia Quick Evaluation

## 2016-12-26 NOTE — Interval H&P Note (Signed)
History and Physical Interval Note:  12/26/2016 1:14 PM  Anne [name]  has presented today for surgery, with the diagnosis of Osteoarthritis Left Knee  The various methods of treatment have been discussed with the patient and family. After consideration of risks, benefits and other options for treatment, the patient has consented to  Procedure(s) with comments: LEFT TOTAL KNEE ARTHROPLASTY (Left) - requests 3mins as a surgical intervention .  The patient's history has been reviewed, patient examined, no change in status, stable for surgery.  I have reviewed the patient's chart and labs.  Questions were answered to the patient's satisfaction.     Gearlean Alf

## 2016-12-27 LAB — BASIC METABOLIC PANEL
ANION GAP: 8 (ref 5–15)
BUN: 14 mg/dL (ref 6–20)
CHLORIDE: 104 mmol/L (ref 101–111)
CO2: 27 mmol/L (ref 22–32)
Calcium: 9.3 mg/dL (ref 8.9–10.3)
Creatinine, Ser: 0.67 mg/dL (ref 0.44–1.00)
GFR calc Af Amer: >60 mL/min (ref >60)
GLUCOSE: 140 mg/dL — AB (ref 65–99)
POTASSIUM: 3.9 mmol/L (ref 3.5–5.1)
Sodium: 139 mmol/L (ref 135–145)

## 2016-12-27 LAB — CBC
HEMATOCRIT: 35 % — AB (ref 36.0–46.0)
Hemoglobin: 11.5 g/dL — ABNORMAL LOW (ref 12.0–15.0)
MCH: 29.1 pg (ref 26.0–34.0)
MCHC: 32.9 g/dL (ref 30.0–36.0)
MCV: 88.6 fL (ref 78.0–100.0)
Platelets: 180 10*3/uL (ref 150–400)
RBC: 3.95 MIL/uL (ref 3.87–5.11)
RDW: 13.6 % (ref 11.5–15.5)
WBC: 12.8 10*3/uL — ABNORMAL HIGH (ref 4.0–10.5)

## 2016-12-27 MED ORDER — RIVAROXABAN 10 MG PO TABS: 10.0000 mg | ORAL_TABLET | Freq: Every day | ORAL | 0 refills | Status: DC

## 2016-12-27 MED ORDER — METHOCARBAMOL 500 MG PO TABS: 500.0000 mg | ORAL_TABLET | Freq: Four times a day (QID) | ORAL | 0 refills | Status: DC | PRN

## 2016-12-27 MED ORDER — OXYCODONE HCL 5 MG PO TABS: 5.0000 mg | ORAL_TABLET | ORAL | 0 refills | Status: DC | PRN

## 2016-12-27 NOTE — Discharge Instructions (Addendum)
° °Dr. Frank Aluisio °Total Joint Specialist °[facility] °[address] °[place], [place] [address] °[phone] ° °TOTAL KNEE REPLACEMENT POSTOPERATIVE DIRECTIONS ° °Knee Rehabilitation, Guidelines Following Surgery  °Results after knee surgery are often greatly improved when you follow the exercise, range of motion and muscle strengthening exercises prescribed by your doctor. Safety measures are also important to protect the knee from further injury. Any time any of these exercises cause you to have increased pain or swelling in your knee joint, decrease the amount until you are comfortable again and slowly increase them. If you have problems or questions, call your caregiver or physical therapist for advice.  ° °HOME CARE INSTRUCTIONS  °Remove items at home which could result in a fall. This includes throw rugs or furniture in walking pathways.  °· ICE to the affected knee every three hours for 30 minutes at a time and then as needed for pain and swelling.  Continue to use ice on the knee for pain and swelling from surgery. You may notice swelling that will progress down to the foot and ankle.  This is normal after surgery.  Elevate the leg when you are not up walking on it.   °· Continue to use the breathing machine which will help keep your temperature down.  It is common for your temperature to cycle up and down following surgery, especially at night when you are not up moving around and exerting yourself.  The breathing machine keeps your lungs expanded and your temperature down. °· Do not place pillow under knee, focus on keeping the knee straight while resting ° °DIET °You may resume your previous home diet once your are discharged from the hospital. ° °DRESSING / WOUND CARE / SHOWERING °You may shower 3 days after surgery, but keep the wounds dry during showering.  You may use an occlusive plastic wrap (Press'n Seal for example), NO SOAKING/SUBMERGING IN THE BATHTUB.  If the  bandage gets wet, change with a clean dry gauze.  If the incision gets wet, pat the wound dry with a clean towel. °You may start showering once you are discharged home but do not submerge the incision under water. Just pat the incision dry and apply a dry gauze dressing on daily. °Change the surgical dressing daily and reapply a dry dressing each time. ° °ACTIVITY °Walk with your walker as instructed. °Use walker as long as suggested by your caregivers. °Avoid periods of inactivity such as sitting longer than an hour when not asleep. This helps prevent blood clots.  °You may resume a sexual relationship in one month or when given the OK by your doctor.  °You may return to work once you are cleared by your doctor.  °Do not drive a car for 6 weeks or until released by you surgeon.  °Do not drive while taking narcotics. ° °WEIGHT BEARING °Weight bearing as tolerated with assist device (walker, cane, etc) as directed, use it as long as suggested by your surgeon or therapist, typically at least 4-6 weeks. ° °POSTOPERATIVE CONSTIPATION PROTOCOL °Constipation - defined medically as fewer than three stools per week and severe constipation as less than one stool per week. ° °One of the most common issues patients have following surgery is constipation.  Even if you have a regular bowel pattern at home, your normal regimen is likely to be disrupted due to multiple reasons following surgery.  Combination of anesthesia, postoperative narcotics, change in appetite and fluid intake all can affect your bowels.    In order to avoid complications following surgery, here are some recommendations in order to help you during your recovery period. ° °Colace (docusate) - Pick up an over-the-counter form of Colace or another stool softener and take twice a day as long as you are requiring postoperative pain medications.  Take with a full glass of water daily.  If you experience loose stools or diarrhea, hold the colace until you stool forms  back up.  If your symptoms do not get better within 1 week or if they get worse, check with your doctor. ° °Dulcolax (bisacodyl) - Pick up over-the-counter and take as directed by the product packaging as needed to assist with the movement of your bowels.  Take with a full glass of water.  Use this product as needed if not relieved by Colace only.  ° °MiraLax (polyethylene glycol) - Pick up over-the-counter to have on hand.  MiraLax is a solution that will increase the amount of water in your bowels to assist with bowel movements.  Take as directed and can mix with a glass of water, juice, soda, coffee, or tea.  Take if you go more than two days without a movement. °Do not use MiraLax more than once per day. Call your doctor if you are still constipated or irregular after using this medication for 7 days in a row. ° °If you continue to have problems with postoperative constipation, please contact the office for further assistance and recommendations.  If you experience "the worst abdominal pain ever" or develop nausea or vomiting, please contact the office immediatly for further recommendations for treatment. ° °ITCHING ° If you experience itching with your medications, try taking only a single pain pill, or even half a pain pill at a time.  You can also use Benadryl over the counter for itching or also to help with sleep.  ° °TED HOSE STOCKINGS °Wear the elastic stockings on both legs for three weeks following surgery during the day but you may remove then at night for sleeping. ° °MEDICATIONS °See your medication summary on the “After Visit Summary” that the nursing staff will review with you prior to discharge.  You may have some home medications which will be placed on hold until you complete the course of blood thinner medication.  It is important for you to complete the blood thinner medication as prescribed by your surgeon.  Continue your approved medications as instructed at time of  discharge. ° °PRECAUTIONS °If you experience chest pain or shortness of breath - call 911 immediately for transfer to the hospital emergency department.  °If you develop a fever greater that 101 F, purulent drainage from wound, increased redness or drainage from wound, foul odor from the wound/dressing, or calf pain - CONTACT YOUR SURGEON.   °                                                °FOLLOW-UP APPOINTMENTS °Make sure you keep all of your appointments after your operation with your surgeon and caregivers. You should call the office at the above phone number and make an appointment for approximately two weeks after the date of your surgery or on the date instructed by your surgeon outlined in the "After Visit Summary". ° ° °RANGE OF MOTION AND STRENGTHENING EXERCISES  °Rehabilitation of the knee is important following a knee injury or   an operation. After just a few days of immobilization, the muscles of the thigh which control the knee become weakened and shrink (atrophy). Knee exercises are designed to build up the tone and strength of the thigh muscles and to improve knee motion. Often times heat used for twenty to thirty minutes before working out will loosen up your tissues and help with improving the range of motion but do not use heat for the first two weeks following surgery. These exercises can be done on a training (exercise) mat, on the floor, on a table or on a bed. Use what ever works the best and is most comfortable for you Knee exercises include:  °Leg Lifts - While your knee is still immobilized in a splint or cast, you can do straight leg raises. Lift the leg to 60 degrees, hold for 3 sec, and slowly lower the leg. Repeat 10-20 times 2-3 times daily. Perform this exercise against resistance later as your knee gets better.  °Quad and Hamstring Sets - Tighten up the muscle on the front of the thigh (Quad) and hold for 5-10 sec. Repeat this 10-20 times hourly. Hamstring sets are done by pushing the  foot backward against an object and holding for 5-10 sec. Repeat as with quad sets.  °· Leg Slides: Lying on your back, slowly slide your foot toward your buttocks, bending your knee up off the floor (only go as far as is comfortable). Then slowly slide your foot back down until your leg is flat on the floor again. °· Angel Wings: Lying on your back spread your legs to the side as far apart as you can without causing discomfort.  °A rehabilitation program following serious knee injuries can speed recovery and prevent re-injury in the future due to weakened muscles. Contact your doctor or a physical therapist for more information on knee rehabilitation.  ° °IF YOU ARE TRANSFERRED TO A SKILLED REHAB FACILITY °If the patient is transferred to a skilled rehab facility following release from the hospital, a list of the current medications will be sent to the facility for the patient to continue.  When discharged from the skilled rehab facility, please have the facility set up the patient's Home Health Physical Therapy prior to being released. Also, the skilled facility will be responsible for providing the patient with their medications at time of release from the facility to include their pain medication, the muscle relaxants, and their blood thinner medication. If the patient is still at the rehab facility at time of the two week follow up appointment, the skilled rehab facility will also need to assist the patient in arranging follow up appointment in our office and any transportation needs. ° °MAKE SURE YOU:  °Understand these instructions.  °Get help right away if you are not doing well or get worse.  ° ° °Pick up stool softner and laxative for home use following surgery while on pain medications. °Do not submerge incision under water. °Please use good hand washing techniques while changing dressing each day. °May shower starting three days after surgery. °Please use a clean towel to pat the incision dry following  showers. °Continue to use ice for pain and swelling after surgery. °Do not use any lotions or creams on the incision until instructed by your surgeon. ° °Take Xarelto for two and a half more weeks following discharge from the hospital, then discontinue Xarelto. °Once the patient has completed the Xarelto, they may resume the 81 mg Aspirin. ° ° ° °Information on   my medicine - XARELTO (Rivaroxaban)  This medication education was reviewed with me or my healthcare representative as part of my discharge preparation.  The pharmacist that spoke with me during my hospital stay was:  Tommie Raymond, Student-PharmD  Why was Xarelto prescribed for you? Xarelto was prescribed for you to reduce the risk of blood clots forming after orthopedic surgery. The medical term for these abnormal blood clots is venous thromboembolism (VTE).  What do you need to know about xarelto ? Take your Xarelto ONCE DAILY at the same time every day. You may take it either with or without food.  If you have difficulty swallowing the tablet whole, you may crush it and mix in applesauce just prior to taking your dose.  Take Xarelto exactly as prescribed by your doctor and DO NOT stop taking Xarelto without talking to the doctor who prescribed the medication.  Stopping without other VTE prevention medication to take the place of Xarelto may increase your risk of developing a clot.  After discharge, you should have regular check-up appointments with your healthcare provider that is prescribing your Xarelto.    What do you do if you miss a dose? If you miss a dose, take it as soon as you remember on the same day then continue your regularly scheduled once daily regimen the next day. Do not take two doses of Xarelto on the same day.   Important Safety Information A possible side effect of Xarelto is bleeding. You should call your healthcare provider right away if you experience any of the following: ? Bleeding from an injury  or your nose that does not stop. ? Unusual colored urine (red or dark brown) or unusual colored stools (red or black). ? Unusual bruising for unknown reasons. ? A serious fall or if you hit your head (even if there is no bleeding).  Some medicines may interact with Xarelto and might increase your risk of bleeding while on Xarelto. To help avoid this, consult your healthcare provider or pharmacist prior to using any new prescription or non-prescription medications, including herbals, vitamins, non-steroidal anti-inflammatory drugs (NSAIDs) and supplements.  This website has more information on Xarelto: [email or web address]

## 2016-12-27 NOTE — Progress Notes (Signed)
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    Follow Up Recommendations  Home health PT     Equipment Recommendations  None recommended by PT    Recommendations for Other Services       Precautions / Restrictions Precautions Precautions: Fall; Knee Required Braces or Orthoses: Knee Immobilizer - Left Knee Immobilizer - Left: Discontinue once straight leg raise with < 10 degree lag (KI d/c'd 3/27-able to SLR) Restrictions Weight Bearing Restrictions: No LLE Weight Bearing: Weight bearing as tolerated    Mobility  Bed Mobility Overal bed mobility: Modified Independent             General bed mobility comments: oob  Transfers Overall transfer level: Needs assistance Equipment used: Rolling walker (2 wheeled) Transfers: Sit to/from Stand Sit to Stand: Supervision         General transfer comment: VCs hand/LE placement.   Ambulation/Gait Ambulation/Gait assistance: Min guard Ambulation Distance (Feet): 110 Feet Assistive device: Rolling walker (2 wheeled) Gait Pattern/deviations: Step-to pattern;Antalgic     General Gait Details: close guard for safety.    Stairs            Wheelchair Mobility    Modified Rankin (Stroke Patients Only)       Balance                                            Cognition Arousal/Alertness: Awake/alert Behavior During Therapy: WFL for tasks assessed/performed Overall Cognitive Status: Within Functional Limits for tasks assessed                                        Exercises Total Joint Exercises Ankle Circles/Pumps: AROM;Both;10 reps;Supine Quad Sets: AROM;10 reps;Supine Hip ABduction/ADduction: AROM;Left;10 reps;Supine Straight Leg Raises: AROM;Left;10 reps;Supine    General  Comments        Pertinent Vitals/Pain Pain Assessment: 0-10 Pain Score: 5  Pain Location: L knee with activity Pain Descriptors / Indicators: Sore;Aching Pain Intervention(s): Monitored during session;Repositioned;Ice applied    Home Living Family/patient expects to be discharged to:: Private residence Living Arrangements: Parent Available Help at Discharge: Family         Home Equipment: Gilford Rile - 2 wheels;Bedside commode;Cane - single point;Shower seat      Prior Function Level of Independence: Independent          PT Goals (current goals can now be found in the care plan section) Acute Rehab PT Goals Patient Stated Goal: regain independence Progress towards PT goals: Progressing toward goals    Frequency    7X/week      PT Plan Current plan remains appropriate    Co-evaluation             End of Session Equipment Utilized During Treatment: Gait belt Activity Tolerance: Patient tolerated treatment well Patient left: in bed;with call bell/phone within reach   PT Visit Diagnosis: Muscle weakness (generalized) (M62.81);Difficulty in walking, not elsewhere classified (R26.2)     Time: 1330-1350 PT Time Calculation (min) (ACUTE ONLY): 20 min  Charges:  $Gait Training: 8-22 mins  G Codes:          Weston Anna, MPT Pager: [phone]

## 2016-12-27 NOTE — Evaluation (Signed)
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 All education was completed. No further OT is needed at this time    Follow Up Recommendations  No OT follow up    Equipment Recommendations  None recommended by OT    Recommendations for Other Services       Precautions / Restrictions Precautions Precautions: Fall Precaution Comments: pt states KI was discontinued Required Braces or Orthoses: Knee Immobilizer - Left Knee Immobilizer - Left: Discontinue once straight leg raise with < 10 degree lag (KI d/c'd 3/27-able to SLR) Restrictions Weight Bearing Restrictions: No LLE Weight Bearing: Weight bearing as tolerated      Mobility Bed Mobility Overal bed mobility: Modified Independent             General bed mobility comments: oob  Transfers Overall transfer level: Needs assistance Equipment used: Rolling walker (2 wheeled) Transfers: Sit to/from Stand Sit to Stand: Supervision             Balance                                           ADL either performed or assessed with clinical judgement   ADL Overall ADL's : Needs assistance/impaired Eating/Feeding: Independent   Grooming: Supervision/safety;Standing   Upper Body Bathing: Supervision/ safety;Sitting   Lower Body Bathing: Supervison/ safety;Sit to/from stand   Upper Body Dressing : Supervision/safety;Sitting   Lower Body Dressing: Minimal assistance;Sit to/from stand   Toilet Transfer: Supervision/safety;Ambulation;Comfort height toilet;RW;BSC   Toileting- Clothing Manipulation and Hygiene: Supervision/safety;Sit to/from stand         General ADL Comments: pt has good safety awareness and demonstrated sidestepping through tight spaces. Verbalizes understanding of knee  precautions.  Supervision to gather items with RW     Vision         Perception     Praxis      Pertinent Vitals/Pain Pain Assessment: 0-10 Pain Score: 3  Pain Location: L knee with activity Pain Descriptors / Indicators: Sore;Aching Pain Intervention(s): Limited activity within patient's tolerance;Repositioned;Monitored during session;Premedicated before session;Ice applied     Hand Dominance     Extremity/Trunk Assessment Upper Extremity Assessment Upper Extremity Assessment: Overall WFL for tasks assessed (h/o bil RCR)      Cervical / Trunk Assessment Cervical / Trunk Assessment: Normal   Communication Communication Communication: No difficulties   Cognition Arousal/Alertness: Awake/alert Behavior During Therapy: WFL for tasks assessed/performed Overall Cognitive Status: Within Functional Limits for tasks assessed                                     General Comments   Pt c/o lightheadedness. Standing BP 105/55    Exercises    Shoulder Instructions      Home Living Family/patient expects to be discharged to:: Private residence Living Arrangements: Parent Available Help at Discharge: Family Type of Home: House Home Access: Stairs to enter Technical brewer of Steps: 3 steps   Home Layout: One level     Bathroom Shower/Tub: Teacher, early years/pre: Handicapped height     Home Equipment: Environmental consultant - 2 wheels;Bedside  commode;Cane - single point;Shower seat          Prior Functioning/Environment Level of Independence: Independent                 OT Problem List:        OT Treatment/Interventions:      OT Goals(Current goals can be found in the care plan section) Acute Rehab OT Goals Patient Stated Goal: regain independence OT Goal Formulation: All assessment and education complete, DC therapy  OT Frequency:     Barriers to D/C:            Co-evaluation              End of Session CPM Left  Knee CPM Left Knee: Off  Activity Tolerance: Patient tolerated treatment well Patient left: in chair;with call bell/phone within reach  OT Visit Diagnosis: Pain Pain - Right/Left: Left Pain - part of body: Knee                Time: 4431-5400 OT Time Calculation (min): 19 min Charges:  OT General Charges $OT Visit: 1 Procedure OT Evaluation $OT Eval Low Complexity: 1 Procedure G-Codes:     [place], OTR/L [phone] 12/27/2016  Anne [name] 12/27/2016, 12:28 PM

## 2016-12-27 NOTE — Discharge Summary (Signed)
Physician Discharge Summary   Patient ID: Anne [name] MRN: [id number] DOB/AGE: [birth year] 61 y.o.  Admit date: 12/26/2016 Discharge date: 12-28-2016  Primary Diagnosis:  Osteoarthritis  Left knee(s) Admission Diagnoses:  Past Medical History:  Diagnosis Date  . Anxiety   . Arthritis   . Cataract    hx of  . Hypertension    Discharge Diagnoses:   Principal Problem:   OA (osteoarthritis) of knee  Estimated body mass index is 33.52 kg/m as calculated from the following:   Height as of this encounter: '5\' 7"'  (1.702 m).   Weight as of this encounter: 97.1 kg (214 lb).  Procedure:  Procedure(s) (LRB): LEFT TOTAL KNEE ARTHROPLASTY (Left)   Consults: None  HPI: Anne [name] is a 61 y.o. year old female with end stage OA of her left knee with progressively worsening pain and dysfunction. She has constant pain, with activity and at rest and significant functional deficits with difficulties even with ADLs. She has had extensive non-op management including analgesics, injections of cortisone and viscosupplements, and home exercise program, but remains in significant pain with significant dysfunction. Radiographs show bone on bone arthritis medial and patellofemoral.. She presents now for left Total Knee Arthroplasty.    Laboratory Data: Admission on 12/26/2016  Component Date Value Ref Range Status  . WBC 12/27/2016 12.8* 4.0 - 10.5 K/uL Final  . RBC 12/27/2016 3.95  3.87 - 5.11 MIL/uL Final  . Hemoglobin 12/27/2016 11.5* 12.0 - 15.0 g/dL Final  . HCT 12/27/2016 35.0* 36.0 - 46.0 % Final  . MCV 12/27/2016 88.6  78.0 - 100.0 fL Final  . MCH 12/27/2016 29.1  26.0 - 34.0 pg Final  . MCHC 12/27/2016 32.9  30.0 - 36.0 g/dL Final  . RDW 12/27/2016 13.6  11.5 - 15.5 % Final  . Platelets 12/27/2016 180  150 - 400 K/uL Final  . Sodium 12/27/2016 139  135 - 145 mmol/L Final  . Potassium 12/27/2016 3.9  3.5 - 5.1 mmol/L Final  . Chloride 12/27/2016 104  101 - 111 mmol/L Final  . CO2  12/27/2016 27  22 - 32 mmol/L Final  . Glucose, Bld 12/27/2016 140* 65 - 99 mg/dL Final  . BUN 12/27/2016 14  6 - 20 mg/dL Final  . Creatinine, Ser 12/27/2016 0.67  0.44 - 1.00 mg/dL Final  . Calcium 12/27/2016 9.3  8.9 - 10.3 mg/dL Final  . GFR calc non Af Amer 12/27/2016 >60  >60 mL/min Final  . GFR calc Af Amer 12/27/2016 >60  >60 mL/min Final   Comment: (NOTE) The eGFR has been calculated using the CKD EPI equation. This calculation has not been validated in all clinical situations. eGFR's persistently <60 mL/min signify possible Chronic Kidney Disease.   Georgiann Hahn gap 12/27/2016 8  5 - 15 Final  Hospital Outpatient Visit on 12/19/2016  Component Date Value Ref Range Status  . aPTT 12/19/2016 24  24 - 36 seconds Final  . Prothrombin Time 12/19/2016 12.7  11.4 - 15.2 seconds Final  . INR 12/19/2016 0.95   Final  . ABO/RH(D) 12/19/2016 O POS   Final  . Antibody Screen 12/19/2016 NEG   Final  . Sample Expiration 12/19/2016 12/29/2016   Final  . Extend sample reason 12/19/2016 NO TRANSFUSIONS OR PREGNANCY IN THE PAST 3 MONTHS   Final  . MRSA, PCR 12/19/2016 POSITIVE* NEGATIVE Final   Comment: RESULT CALLED TO, READ BACK BY AND VERIFIED WITH: AFTERHOURS   . Staphylococcus aureus 12/19/2016 POSITIVE* NEGATIVE Final  Comment:        The Xpert SA Assay (FDA approved for NASAL specimens in patients over 61 years of age), is one component of a comprehensive surveillance program.  Test performance has been validated by [facility] for patients greater than or equal to 61 year old. It is not intended to diagnose infection nor to guide or monitor treatment.   . ABO/RH(D) 12/19/2016 O POS   Final  Orders Only on 12/05/2016  Component Date Value Ref Range Status  . Adequacy 12/05/2016 Satisfactory for evaluation  endocervical/transformation zone component ABSENT.   Final  . Diagnosis 12/05/2016 NEGATIVE FOR INTRAEPITHELIAL LESIONS OR MALIGNANCY.   Final  . Material Submitted  12/05/2016 CervicoVaginal Pap [ThinPrep Imaged]   Final  . CYTOLOGY - PAP 12/05/2016 PAP RESULT   Final-Edited     X-Rays:No results found.  EKG: Orders placed or performed during the hospital encounter of 12/27/14  . ED EKG (<40mns upon arrival to the ED)  . ED EKG (<123ms upon arrival to the ED)  . EKG 12-Lead  . EKG 12-Lead  . EKG     Hospital Course: [name] is a [age].o. [facility] and underwent Procedure(s): LEFT TOTAL KNEE ARTHROPLASTY.  Patient tolerated the procedure well and was later transferred to the recovery room and then to the orthopaedic floor for postoperative care.  They were given PO and IV analgesics for pain control following their surgery.  They were given 24 hours of postoperative antibiotics of  Anti-infectives    Start     Dose/Rate Route Frequency Ordered Stop   12/27/16 0600  ceFAZolin (ANCEF) IVPB 2g/100 mL premix     2 g 200 mL/hr over 30 Minutes Intravenous On call to O.R. 12/26/16 1305 12/26/16 1414   12/26/16 2030  ceFAZolin (ANCEF) IVPB 2g/100 mL premix     2 g 200 mL/hr over 30 Minutes Intravenous Every 6 hours 12/26/16 1715 12/27/16 0300   12/26/16 1530  vancomycin (VANCOCIN) 1,000 mg in sodium chloride 0.9 % 250 mL IVPB     1,000 mg 250 mL/hr over 60 Minutes Intravenous  Once 12/26/16 1512 12/26/16 1617   12/26/16 1514  vancomycin (VANCOCIN) 1-5 GM/200ML-% IVPB    Comments:  ShBridget Hartshorn : cabinet override      12/26/16 1514 12/27/16 0329   12/26/16 1310  ceFAZolin (ANCEF) 2-4 GM/100ML-% IVPB    Comments:  SpMardelle Matte : cabinet override      12/26/16 1310 12/26/16 1414     and started on DVT prophylaxis in the form of Xarelto.   PT and OT were ordered for total joint protocol.  Discharge planning consulted to help with postop disposition and equipment needs.  Patient had a decent night on the evening of surgery.  They started to get up OOB with  therapy on day one. Hemovac drain was pulled without difficulty.  Continued to work with therapy into day two.  Dressing was changed on day two and the incision was healing well. Patient was seen in rounds on day two and was ready to go home.   Discharge home with home health Diet - Cardiac diet Follow up - in 2 weeks Activity - WBAT Disposition - Home Condition Upon Discharge - Good D/C Meds - See DC Summary DVT Prophylaxis - Xarelto   Discharge Instructions    Call MD / Call 911  Complete by:  As directed    If you experience chest pain or shortness of breath, CALL 911 and be transported to the hospital emergency room.  If you develope a fever above 101 F, pus (white drainage) or increased drainage or redness at the wound, or calf pain, call your surgeon's office.   Change dressing    Complete by:  As directed    Change dressing daily with sterile 4 x 4 inch gauze dressing and apply TED hose. Do not submerge the incision under water.   Constipation Prevention    Complete by:  As directed    Drink plenty of fluids.  Prune juice may be helpful.  You may use a stool softener, such as Colace (over the counter) 100 mg twice a day.  Use MiraLax (over the counter) for constipation as needed.   Diet - low sodium heart healthy    Complete by:  As directed    Discharge instructions    Complete by:  As directed    Pick up stool softner and laxative for home use following surgery while on pain medications. Do not submerge incision under water. Please use good hand washing techniques while changing dressing each day. May shower starting three days after surgery. Please use a clean towel to pat the incision dry following showers. Continue to use ice for pain and swelling after surgery. Do not use any lotions or creams on the incision until instructed by your surgeon.  Wear both TED hose on both legs during the day every day for three weeks, but may have off at night at home.  Postoperative  Constipation Protocol  Constipation - defined medically as fewer than three stools per week and severe constipation as less than one stool per week.  One of the most common issues patients have following surgery is constipation.  Even if you have a regular bowel pattern at home, your normal regimen is likely to be disrupted due to multiple reasons following surgery.  Combination of anesthesia, postoperative narcotics, change in appetite and fluid intake all can affect your bowels.  In order to avoid complications following surgery, here are some recommendations in order to help you during your recovery period.  Colace (docusate) - Pick up an over-the-counter form of Colace or another stool softener and take twice a day as long as you are requiring postoperative pain medications.  Take with a full glass of water daily.  If you experience loose stools or diarrhea, hold the colace until you stool forms back up.  If your symptoms do not get better within 1 week or if they get worse, check with your doctor.  Dulcolax (bisacodyl) - Pick up over-the-counter and take as directed by the product packaging as needed to assist with the movement of your bowels.  Take with a full glass of water.  Use this product as needed if not relieved by Colace only.   MiraLax (polyethylene glycol) - Pick up over-the-counter to have on hand.  MiraLax is a solution that will increase the amount of water in your bowels to assist with bowel movements.  Take as directed and can mix with a glass of water, juice, soda, coffee, or tea.  Take if you go more than two days without a movement. Do not use MiraLax more than once per day. Call your doctor if you are still constipated or irregular after using this medication for 7 days in a row.  If you continue to have problems with postoperative constipation,  please contact the office for further assistance and recommendations.  If you experience "the worst abdominal pain ever" or develop  nausea or vomiting, please contact the office immediatly for further recommendations for treatment.   Take Xarelto for two and a half more weeks, then discontinue Xarelto. Once the patient has completed the Xarelto, they may resume the 81 mg Aspirin.   Do not put a pillow under the knee. Place it under the heel.    Complete by:  As directed    Do not sit on low chairs, stoools or toilet seats, as it may be difficult to get up from low surfaces    Complete by:  As directed    Driving restrictions    Complete by:  As directed    No driving until released by the physician.   Increase activity slowly as tolerated    Complete by:  As directed    Lifting restrictions    Complete by:  As directed    No lifting until released by the physician.   Patient may shower    Complete by:  As directed    You may shower without a dressing once there is no drainage.  Do not wash over the wound.  If drainage remains, do not shower until drainage stops.   TED hose    Complete by:  As directed    Use stockings (TED hose) for 3 weeks on both leg(s).  You may remove them at night for sleeping.   Weight bearing as tolerated    Complete by:  As directed    Laterality:  left   Extremity:  Lower     Allergies as of 12/27/2016   No Known Allergies     Medication List    STOP taking these medications   aspirin EC 81 MG tablet   [employer] CALCIUM CITRATE PO   EXCEDRIN EXTRA STRENGTH 250-250-65 MG tablet Generic drug:  aspirin-acetaminophen-caffeine   FEVERFEW PO   Fish Oil 1200 MG Caps   ibuprofen 200 MG tablet Commonly known as:  ADVIL,MOTRIN   multivitamin with minerals Tabs tablet   TURMERIC PO   VITAMIN D3 PO     TAKE these medications   acetaminophen 325 MG tablet Commonly known as:  TYLENOL Take 325 mg by mouth every 6 (six) hours as needed (for pain/headache.).   ALPRAZolam 0.5 MG tablet Commonly known as:  XANAX Take 0.5-1 mg by mouth 2 (two) times daily as needed for sleep or  anxiety.   atorvastatin 20 MG tablet Commonly known as:  LIPITOR Take 20 mg by mouth at bedtime.   buPROPion 300 MG 24 hr tablet Commonly known as:  WELLBUTRIN XL Take 300 mg by mouth daily.   fluticasone 50 MCG/ACT nasal spray Commonly known as:  FLONASE Place 1 spray into the nose daily as needed for allergies.   GENTEAL OP Apply 1 drop to eye 3 (three) times daily as needed (dry eyes).   methocarbamol 500 MG tablet Commonly known as:  ROBAXIN Take 1 tablet (500 mg total) by mouth every 6 (six) hours as needed for muscle spasms.   oxyCODONE 5 MG immediate release tablet Commonly known as:  Oxy IR/ROXICODONE Take 1-2 tablets (5-10 mg total) by mouth every 4 (four) hours as needed for moderate pain or severe pain.   potassium chloride SA 20 MEQ tablet Commonly known as:  K-DUR,KLOR-CON Take 20 mEq by mouth daily.   rivaroxaban 10 MG Tabs tablet Commonly known as:  XARELTO Take  1 tablet (10 mg total) by mouth daily with breakfast. Take Xarelto for two and a half more weeks following discharge from the hospital, then discontinue Xarelto. Once the patient has completed the Xarelto, they may resume the 81 mg Aspirin. Start taking on:  12/28/2016   triamterene-hydrochlorothiazide 75-50 MG tablet Commonly known as:  MAXZIDE Take 0.5 tablets by mouth at bedtime.            Durable Medical Equipment        Start     Ordered   12/27/16 1101  For home use only DME Walker rolling  Once    Question:  Patient needs a walker to treat with the following condition  Answer:  OA (osteoarthritis) of knee   12/27/16 1100     Follow-up Information    [facility] Follow up.   Specialty:  Home Health Services Why:  home health agency Contact information: [place] [address] [phone]        [facility] - [facility] Follow up.   Why:  rolling walker  Contact information: [address] [place] [address] [phone]          Gearlean Alf, MD. Schedule an appointment as soon as possible for a visit on 01/10/2017.   Specialty:  Orthopedic Surgery Contact information: [address] [place] [address] [phone]           Signed: Arlee Muslim, PA-C Orthopaedic Surgery 12/27/2016, 10:35 PM

## 2016-12-27 NOTE — Care Management Note (Signed)
Case Management Note  Patient Details  Name: Anne [name] MRN: [id number] Date of Birth: [birth year]  Subjective/Objective:                  LEFT TOTAL KNEE ARTHROPLASTY (Left) Action/Plan: Discharge planning Expected Discharge Date:                  Expected Discharge Plan:  [place]  In-House Referral:     Discharge planning Services  CM Consult  Post Acute Care Choice:  Home Health Choice offered to:  Patient  DME Arranged:  Walker rolling DME Agency:     HH Arranged:  PT [place]:  [facility] (formerly [employer])  Status of Service:  Completed, signed off  If discussed at [employer], dates discussed:    Additional Comments: CM met with pt in room to offer choice of home health agency. Pt chooses [facility] to render HHPT. Referral given to [employer]. CM notified [place] DME rep, Jermaine to please deliver the rolling walker to room prior to discharge.  No other CM needs were communicated. Dellie Catholic, RN 12/27/2016, 11:25 AM

## 2016-12-27 NOTE — Evaluation (Signed)
Physical Therapy Evaluation Patient Details Name: Anne [name] MRN: [id number] DOB: 02/03/1956 Today's Date: 12/27/2016   History of Present Illness  61 yo female s/p L TKA 12/26/16  Clinical Impression  On eval, pt required Min guard assist for mobility. She walked ~60 feet with a RW. Pain rated 5/10. Pt c/o mild lightheadedness but she was able to participate well. Anticipate pt will progress well.     Follow Up Recommendations Home health PT    Equipment Recommendations  None recommended by PT    Recommendations for Other Services OT consult     Precautions / Restrictions Precautions Precautions: None Required Braces or Orthoses: Knee Immobilizer - Left Knee Immobilizer - Left: Discontinue once straight leg raise with < 10 degree lag (KI d/c'd 3/27-able to SLR) Restrictions Weight Bearing Restrictions: No LLE Weight Bearing: Weight bearing as tolerated      Mobility  Bed Mobility Overal bed mobility: Modified Independent                Transfers Overall transfer level: Needs assistance Equipment used: Rolling walker (2 wheeled) Transfers: Sit to/from Stand Sit to Stand: Min guard         General transfer comment: close guard for safety. VCs hand/LE placement.   Ambulation/Gait Ambulation/Gait assistance: Min guard Ambulation Distance (Feet): 60 Feet Assistive device: Rolling walker (2 wheeled) Gait Pattern/deviations: Step-to pattern     General Gait Details: close guard for safety. followed with recliner for safety-pt c/o some lightheadedness.   Stairs            Wheelchair Mobility    Modified Rankin (Stroke Patients Only)       Balance                                             Pertinent Vitals/Pain Pain Assessment: 0-10 Pain Score: 5  Pain Location: L knee with activity Pain Descriptors / Indicators: Sore;Aching Pain Intervention(s): Limited activity within patient's tolerance;Repositioned;Ice applied     Home Living Family/patient expects to be discharged to:: Private residence Living Arrangements: Parent Available Help at Discharge: Family Type of Home: House Home Access: Stairs to enter   Technical brewer of Steps: 3 steps Home Layout: One level Home Equipment: Environmental consultant - 2 wheels;Bedside commode;Cane - single point;Shower seat      Prior Function Level of Independence: Independent               Hand Dominance        Extremity/Trunk Assessment   Upper Extremity Assessment Upper Extremity Assessment: Defer to OT evaluation    Lower Extremity Assessment Lower Extremity Assessment: Generalized weakness (s/p L TKA)    Cervical / Trunk Assessment Cervical / Trunk Assessment: Normal  Communication   Communication: No difficulties  Cognition Arousal/Alertness: Awake/alert Behavior During Therapy: WFL for tasks assessed/performed Overall Cognitive Status: Within Functional Limits for tasks assessed                                        General Comments      Exercises Total Joint Exercises Ankle Circles/Pumps: AROM;Both;10 reps;Supine Quad Sets: AROM;10 reps;Supine Hip ABduction/ADduction: AROM;Left;10 reps;Supine Straight Leg Raises: AROM;Left;10 reps;Supine Knee Flexion: AAROM;Left;10 reps;Seated (washcloth under foot to aid sliding) Goniometric ROM: ~5-75 degrees   Assessment/Plan  PT Assessment Patient needs continued PT services  PT Problem List Decreased strength;Decreased mobility;Decreased range of motion;Decreased activity tolerance;Decreased balance;Decreased knowledge of use of DME;Pain       PT Treatment Interventions DME instruction;Therapeutic activities;Gait training;Therapeutic exercise;Patient/family education;Stair training;Balance training;Functional mobility training    PT Goals (Current goals can be found in the Care Plan section)  Acute Rehab PT Goals Patient Stated Goal: regain independence PT Goal  Formulation: With patient Time For Goal Achievement: 01/10/17 Potential to Achieve Goals: Good    Frequency 7X/week   Barriers to discharge        Co-evaluation               End of Session Equipment Utilized During Treatment: Gait belt Activity Tolerance: Patient tolerated treatment well Patient left: in chair;with call bell/phone within reach   PT Visit Diagnosis: Muscle weakness (generalized) (M62.81);Difficulty in walking, not elsewhere classified (R26.2)    Time: 8887-5797 PT Time Calculation (min) (ACUTE ONLY): 24 min   Charges:   PT Evaluation $PT Eval Low Complexity: 1 Procedure     PT G Codes:        Weston Anna, MPT Pager: [phone]

## 2016-12-27 NOTE — Progress Notes (Signed)
   Subjective: 1 Day Post-Op Procedure(s) (LRB): LEFT TOTAL KNEE ARTHROPLASTY (Left) Patient reports pain as mild.   Patient seen in rounds for Dr. Wynelle Link. Doing okay for day one. Patient is well, but has had some minor complaints of pain in the knee, requiring pain medications We will start therapy today.  Plan is to go Home after hospital stay.  Objective: Vital signs in last 24 hours: Temp:  [97.5 F (36.4 C)-98.6 F (37 C)] 98.1 F (36.7 C) (03/27 2130) Pulse Rate:  [64-78] 64 (03/27 2130) Resp:  [16-19] 16 (03/27 2130) BP: (115-134)/(50-67) 128/50 (03/27 2130) SpO2:  [93 %-98 %] 97 % (03/27 2130)  Intake/Output from previous day:  Intake/Output Summary (Last 24 hours) at 12/27/16 2229 Last data filed at 12/27/16 2200  Gross per 24 hour  Intake          2893.75 ml  Output             4375 ml  Net         -1481.25 ml    Intake/Output this shift: Total I/O In: 240 [P.O.:240] Out: 700 [Urine:700]  Labs:  Recent Labs  12/27/16 0429  HGB 11.5*    Recent Labs  12/27/16 0429  WBC 12.8*  RBC 3.95  HCT 35.0*  PLT 180    Recent Labs  12/27/16 0429  NA 139  K 3.9  CL 104  CO2 27  BUN 14  CREATININE 0.67  GLUCOSE 140*  CALCIUM 9.3   No results for input(s): LABPT, INR in the last 72 hours.  EXAM General - Patient is Alert, Appropriate and Oriented Extremity - Neurovascular intact Sensation intact distally Intact pulses distally Dorsiflexion/Plantar flexion intact Dressing - dressing C/D/I Motor Function - intact, moving foot and toes well on exam.  Hemovac pulled without difficulty.  Past Medical History:  Diagnosis Date  . Anxiety   . Arthritis   . Cataract    hx of  . Hypertension     Assessment/Plan: 1 Day Post-Op Procedure(s) (LRB): LEFT TOTAL KNEE ARTHROPLASTY (Left) Principal Problem:   OA (osteoarthritis) of knee  Estimated body mass index is 33.52 kg/m as calculated from the following:   Height as of this encounter: 5\' 7"   (1.702 m).   Weight as of this encounter: 97.1 kg (214 lb). Up with therapy Discharge home with home health  DVT Prophylaxis - Xarelto Weight-Bearing as tolerated to left leg D/C O2 and Pulse OX and try on Room Air  Arlee Muslim, PA-C Orthopaedic Surgery 12/27/2016, 10:29 PM

## 2016-12-28 LAB — BASIC METABOLIC PANEL
Anion gap: 5 (ref 5–15)
BUN: 20 mg/dL (ref 6–20)
CALCIUM: 8.8 mg/dL — AB (ref 8.9–10.3)
CHLORIDE: 108 mmol/L (ref 101–111)
CO2: 26 mmol/L (ref 22–32)
CREATININE: 0.65 mg/dL (ref 0.44–1.00)
GFR calc non Af Amer: >60 mL/min (ref >60)
Glucose, Bld: 131 mg/dL — ABNORMAL HIGH (ref 65–99)
Potassium: 3.9 mmol/L (ref 3.5–5.1)
Sodium: 139 mmol/L (ref 135–145)

## 2016-12-28 LAB — CBC
HCT: 32.8 % — ABNORMAL LOW (ref 36.0–46.0)
HEMOGLOBIN: 11 g/dL — AB (ref 12.0–15.0)
MCH: 30.5 pg (ref 26.0–34.0)
MCHC: 33.5 g/dL (ref 30.0–36.0)
MCV: 90.9 fL (ref 78.0–100.0)
Platelets: 165 10*3/uL (ref 150–400)
RBC: 3.61 MIL/uL — ABNORMAL LOW (ref 3.87–5.11)
RDW: 14.1 % (ref 11.5–15.5)
WBC: 13.8 10*3/uL — ABNORMAL HIGH (ref 4.0–10.5)

## 2016-12-28 NOTE — Progress Notes (Signed)
qPhysical Therapy Treatment Patient Details Name: Anne [name] MRN: [id number] DOB: Nov 22, 1955 Today's Date: 12/28/2016    History of Present Illness 61 yo female s/p L TKA 12/26/16    PT Comments    Progressing with mobility. Reviewed/practiced exercises, gait training. All education completed. Ready to d/c from PT standpoint.    Follow Up Recommendations  Home health PT     Equipment Recommendations  None recommended by PT    Recommendations for Other Services       Precautions / Restrictions Precautions Precautions: Fall Knee Immobilizer - Left: Discontinue once straight leg raise with < 10 degree lag (KI d/c'd-able to SLR) Restrictions Weight Bearing Restrictions: No LLE Weight Bearing: Weight bearing as tolerated    Mobility  Bed Mobility Overal bed mobility: Modified Independent                Transfers Overall transfer level: Needs assistance Equipment used: Rolling walker (2 wheeled) Transfers: Sit to/from Stand Sit to Stand: Supervision         General transfer comment: VCs hand/LE placement.   Ambulation/Gait Ambulation/Gait assistance: Supervision Ambulation Distance (Feet): 150 Feet Assistive device: Rolling walker (2 wheeled) Gait Pattern/deviations: Step-to pattern;Antalgic     General Gait Details: for safety.    Stairs            Wheelchair Mobility    Modified Rankin (Stroke Patients Only)       Balance                                            Cognition Arousal/Alertness: Awake/alert Behavior During Therapy: WFL for tasks assessed/performed Overall Cognitive Status: Within Functional Limits for tasks assessed                                        Exercises Total Joint Exercises Ankle Circles/Pumps: AROM;Both;10 reps;Supine Quad Sets: AROM;10 reps;Supine Hip ABduction/ADduction: AROM;Left;10 reps;Supine Straight Leg Raises: AROM;Left;10 reps;Supine Knee Flexion:  AAROM;Left;10 reps;Seated Goniometric ROM: ~5-85 degrees    General Comments        Pertinent Vitals/Pain Pain Assessment: 0-10 Pain Score: 6  Pain Location: L knee with activity Pain Descriptors / Indicators: Sore;Aching Pain Intervention(s): Monitored during session;Ice applied    Home Living                      Prior Function            PT Goals (current goals can now be found in the care plan section) Progress towards PT goals: Progressing toward goals    Frequency    7X/week      PT Plan Current plan remains appropriate    Co-evaluation             End of Session Equipment Utilized During Treatment: Gait belt Activity Tolerance: Patient tolerated treatment well Patient left: in chair;with call bell/phone within reach   PT Visit Diagnosis: Muscle weakness (generalized) (M62.81);Difficulty in walking, not elsewhere classified (R26.2)     Time: 9485-4627 PT Time Calculation (min) (ACUTE ONLY): 18 min  Charges:  $Gait Training: 8-22 mins                    G Codes:       Weston Anna, MPT Pager:  [phone]   

## 2016-12-28 NOTE — Progress Notes (Signed)
   Subjective: 2 Days Post-Op Procedure(s) (LRB): LEFT TOTAL KNEE ARTHROPLASTY (Left) Patient reports pain as mild.   Patient seen in rounds with Dr. Wynelle Link. Patient is well, and has had no acute complaints or problems Patient is ready to go home  Objective: Vital signs in last 24 hours: Temp:  [97.5 F (36.4 C)-98.6 F (37 C)] 98.2 F (36.8 C) (03/28 0541) Pulse Rate:  [64-78] 66 (03/28 0541) Resp:  [15-18] 15 (03/28 0541) BP: (114-128)/(50-59) 114/50 (03/28 0541) SpO2:  [95 %-98 %] 95 % (03/28 0541)  Intake/Output from previous day:  Intake/Output Summary (Last 24 hours) at 12/28/16 0928 Last data filed at 12/28/16 0600  Gross per 24 hour  Intake             1510 ml  Output             3630 ml  Net            -2120 ml    Intake/Output this shift: No intake/output data recorded.  Labs:  Recent Labs  12/27/16 0429 12/28/16 0425  HGB 11.5* 11.0*    Recent Labs  12/27/16 0429 12/28/16 0425  WBC 12.8* 13.8*  RBC 3.95 3.61*  HCT 35.0* 32.8*  PLT 180 165    Recent Labs  12/27/16 0429 12/28/16 0425  NA 139 139  K 3.9 3.9  CL 104 108  CO2 27 26  BUN 14 20  CREATININE 0.67 0.65  GLUCOSE 140* 131*  CALCIUM 9.3 8.8*   No results for input(s): LABPT, INR in the last 72 hours.  EXAM: General - Patient is Alert and Appropriate Extremity - Neurovascular intact Sensation intact distally Intact pulses distally Dorsiflexion/Plantar flexion intact Incision - clean, dry Motor Function - intact, moving foot and toes well on exam.   Assessment/Plan: 2 Days Post-Op Procedure(s) (LRB): LEFT TOTAL KNEE ARTHROPLASTY (Left) Procedure(s) (LRB): LEFT TOTAL KNEE ARTHROPLASTY (Left) Past Medical History:  Diagnosis Date  . Anxiety   . Arthritis   . Cataract    hx of  . Hypertension    Principal Problem:   OA (osteoarthritis) of knee  Estimated body mass index is 33.52 kg/m as calculated from the following:   Height as of this encounter: 5\' 7"  (1.702  m).   Weight as of this encounter: 97.1 kg (214 lb). Up with therapy Discharge home with home health Diet - Cardiac diet Follow up - in 2 weeks Activity - WBAT Disposition - Home Condition Upon Discharge - Good D/C Meds - See DC Summary DVT Prophylaxis - Xarelto  Arlee Muslim, PA-C Orthopaedic Surgery 12/28/2016, 9:28 AM

## 2016-12-29 DIAGNOSIS — I10 Essential (primary) hypertension: Secondary | ICD-10-CM | POA: Diagnosis not present

## 2016-12-29 DIAGNOSIS — Z96652 Presence of left artificial knee joint: Secondary | ICD-10-CM | POA: Diagnosis not present

## 2016-12-29 DIAGNOSIS — Z79891 Long term (current) use of opiate analgesic: Secondary | ICD-10-CM | POA: Diagnosis not present

## 2016-12-29 DIAGNOSIS — M1711 Unilateral primary osteoarthritis, right knee: Secondary | ICD-10-CM | POA: Diagnosis not present

## 2016-12-29 DIAGNOSIS — Z471 Aftercare following joint replacement surgery: Secondary | ICD-10-CM | POA: Diagnosis not present

## 2016-12-29 DIAGNOSIS — F419 Anxiety disorder, unspecified: Secondary | ICD-10-CM | POA: Diagnosis not present

## 2016-12-29 DIAGNOSIS — F329 Major depressive disorder, single episode, unspecified: Secondary | ICD-10-CM | POA: Diagnosis not present

## 2016-12-29 DIAGNOSIS — Z7901 Long term (current) use of anticoagulants: Secondary | ICD-10-CM | POA: Diagnosis not present

## 2016-12-31 DIAGNOSIS — I10 Essential (primary) hypertension: Secondary | ICD-10-CM | POA: Diagnosis not present

## 2016-12-31 DIAGNOSIS — Z79891 Long term (current) use of opiate analgesic: Secondary | ICD-10-CM | POA: Diagnosis not present

## 2016-12-31 DIAGNOSIS — F419 Anxiety disorder, unspecified: Secondary | ICD-10-CM | POA: Diagnosis not present

## 2016-12-31 DIAGNOSIS — F329 Major depressive disorder, single episode, unspecified: Secondary | ICD-10-CM | POA: Diagnosis not present

## 2016-12-31 DIAGNOSIS — Z96652 Presence of left artificial knee joint: Secondary | ICD-10-CM | POA: Diagnosis not present

## 2016-12-31 DIAGNOSIS — M1711 Unilateral primary osteoarthritis, right knee: Secondary | ICD-10-CM | POA: Diagnosis not present

## 2016-12-31 DIAGNOSIS — Z471 Aftercare following joint replacement surgery: Secondary | ICD-10-CM | POA: Diagnosis not present

## 2016-12-31 DIAGNOSIS — Z7901 Long term (current) use of anticoagulants: Secondary | ICD-10-CM | POA: Diagnosis not present

## 2017-01-03 DIAGNOSIS — Z79891 Long term (current) use of opiate analgesic: Secondary | ICD-10-CM | POA: Diagnosis not present

## 2017-01-03 DIAGNOSIS — F419 Anxiety disorder, unspecified: Secondary | ICD-10-CM | POA: Diagnosis not present

## 2017-01-03 DIAGNOSIS — M1711 Unilateral primary osteoarthritis, right knee: Secondary | ICD-10-CM | POA: Diagnosis not present

## 2017-01-03 DIAGNOSIS — F329 Major depressive disorder, single episode, unspecified: Secondary | ICD-10-CM | POA: Diagnosis not present

## 2017-01-03 DIAGNOSIS — Z471 Aftercare following joint replacement surgery: Secondary | ICD-10-CM | POA: Diagnosis not present

## 2017-01-03 DIAGNOSIS — Z96652 Presence of left artificial knee joint: Secondary | ICD-10-CM | POA: Diagnosis not present

## 2017-01-03 DIAGNOSIS — Z7901 Long term (current) use of anticoagulants: Secondary | ICD-10-CM | POA: Diagnosis not present

## 2017-01-03 DIAGNOSIS — I10 Essential (primary) hypertension: Secondary | ICD-10-CM | POA: Diagnosis not present

## 2017-01-05 DIAGNOSIS — Z79891 Long term (current) use of opiate analgesic: Secondary | ICD-10-CM | POA: Diagnosis not present

## 2017-01-05 DIAGNOSIS — F329 Major depressive disorder, single episode, unspecified: Secondary | ICD-10-CM | POA: Diagnosis not present

## 2017-01-05 DIAGNOSIS — I10 Essential (primary) hypertension: Secondary | ICD-10-CM | POA: Diagnosis not present

## 2017-01-05 DIAGNOSIS — Z471 Aftercare following joint replacement surgery: Secondary | ICD-10-CM | POA: Diagnosis not present

## 2017-01-05 DIAGNOSIS — Z96652 Presence of left artificial knee joint: Secondary | ICD-10-CM | POA: Diagnosis not present

## 2017-01-05 DIAGNOSIS — M1711 Unilateral primary osteoarthritis, right knee: Secondary | ICD-10-CM | POA: Diagnosis not present

## 2017-01-05 DIAGNOSIS — F419 Anxiety disorder, unspecified: Secondary | ICD-10-CM | POA: Diagnosis not present

## 2017-01-05 DIAGNOSIS — Z7901 Long term (current) use of anticoagulants: Secondary | ICD-10-CM | POA: Diagnosis not present

## 2017-01-06 DIAGNOSIS — Z79891 Long term (current) use of opiate analgesic: Secondary | ICD-10-CM | POA: Diagnosis not present

## 2017-01-06 DIAGNOSIS — I10 Essential (primary) hypertension: Secondary | ICD-10-CM | POA: Diagnosis not present

## 2017-01-06 DIAGNOSIS — Z471 Aftercare following joint replacement surgery: Secondary | ICD-10-CM | POA: Diagnosis not present

## 2017-01-06 DIAGNOSIS — M1711 Unilateral primary osteoarthritis, right knee: Secondary | ICD-10-CM | POA: Diagnosis not present

## 2017-01-06 DIAGNOSIS — Z96652 Presence of left artificial knee joint: Secondary | ICD-10-CM | POA: Diagnosis not present

## 2017-01-06 DIAGNOSIS — F329 Major depressive disorder, single episode, unspecified: Secondary | ICD-10-CM | POA: Diagnosis not present

## 2017-01-06 DIAGNOSIS — F419 Anxiety disorder, unspecified: Secondary | ICD-10-CM | POA: Diagnosis not present

## 2017-01-06 DIAGNOSIS — Z7901 Long term (current) use of anticoagulants: Secondary | ICD-10-CM | POA: Diagnosis not present

## 2017-01-10 DIAGNOSIS — F418 Other specified anxiety disorders: Secondary | ICD-10-CM | POA: Diagnosis not present

## 2017-01-10 DIAGNOSIS — I10 Essential (primary) hypertension: Secondary | ICD-10-CM | POA: Diagnosis not present

## 2017-01-10 DIAGNOSIS — E785 Hyperlipidemia, unspecified: Secondary | ICD-10-CM | POA: Diagnosis not present

## 2017-01-10 DIAGNOSIS — M1990 Unspecified osteoarthritis, unspecified site: Secondary | ICD-10-CM | POA: Diagnosis not present

## 2017-01-12 DIAGNOSIS — M1712 Unilateral primary osteoarthritis, left knee: Secondary | ICD-10-CM | POA: Diagnosis not present

## 2017-01-17 DIAGNOSIS — M1712 Unilateral primary osteoarthritis, left knee: Secondary | ICD-10-CM | POA: Diagnosis not present

## 2017-01-20 DIAGNOSIS — M1712 Unilateral primary osteoarthritis, left knee: Secondary | ICD-10-CM | POA: Diagnosis not present

## 2017-01-23 ENCOUNTER — Ambulatory Visit: Payer: BLUE CROSS/BLUE SHIELD

## 2017-01-27 DIAGNOSIS — M1712 Unilateral primary osteoarthritis, left knee: Secondary | ICD-10-CM | POA: Diagnosis not present

## 2017-01-31 DIAGNOSIS — Z96652 Presence of left artificial knee joint: Secondary | ICD-10-CM | POA: Diagnosis not present

## 2017-01-31 DIAGNOSIS — Z471 Aftercare following joint replacement surgery: Secondary | ICD-10-CM | POA: Diagnosis not present

## 2017-02-01 DIAGNOSIS — M1712 Unilateral primary osteoarthritis, left knee: Secondary | ICD-10-CM | POA: Diagnosis not present

## 2017-02-03 DIAGNOSIS — M1712 Unilateral primary osteoarthritis, left knee: Secondary | ICD-10-CM | POA: Diagnosis not present

## 2017-03-28 ENCOUNTER — Other Ambulatory Visit: Payer: Self-pay | Admitting: Internal Medicine

## 2017-03-28 DIAGNOSIS — Z1231 Encounter for screening mammogram for malignant neoplasm of breast: Secondary | ICD-10-CM

## 2017-04-06 ENCOUNTER — Ambulatory Visit: Payer: BLUE CROSS/BLUE SHIELD

## 2017-04-06 ENCOUNTER — Ambulatory Visit
Admission: RE | Admit: 2017-04-06 | Discharge: 2017-04-06 | Disposition: A | Discharge status: Home / Self Care | Payer: BLUE CROSS/BLUE SHIELD | Source: Ambulatory Visit | Attending: Internal Medicine | Admitting: Internal Medicine

## 2017-04-06 DIAGNOSIS — Z1231 Encounter for screening mammogram for malignant neoplasm of breast: Secondary | ICD-10-CM | POA: Diagnosis not present

## 2017-05-18 DIAGNOSIS — K529 Noninfective gastroenteritis and colitis, unspecified: Secondary | ICD-10-CM | POA: Diagnosis not present

## 2017-05-18 DIAGNOSIS — J069 Acute upper respiratory infection, unspecified: Secondary | ICD-10-CM | POA: Diagnosis not present

## 2017-11-14 DIAGNOSIS — M25512 Pain in left shoulder: Secondary | ICD-10-CM | POA: Insufficient documentation

## 2017-11-30 DIAGNOSIS — M75102 Unspecified rotator cuff tear or rupture of left shoulder, not specified as traumatic: Secondary | ICD-10-CM | POA: Insufficient documentation

## 2017-12-26 DIAGNOSIS — Z96652 Presence of left artificial knee joint: Secondary | ICD-10-CM | POA: Insufficient documentation

## 2018-07-02 ENCOUNTER — Other Ambulatory Visit: Payer: Self-pay | Admitting: Internal Medicine

## 2018-07-02 DIAGNOSIS — Z1231 Encounter for screening mammogram for malignant neoplasm of breast: Secondary | ICD-10-CM

## 2018-07-31 ENCOUNTER — Ambulatory Visit
Admission: RE | Admit: 2018-07-31 | Discharge: 2018-07-31 | Disposition: A | Discharge status: Home / Self Care | Payer: PRIVATE HEALTH INSURANCE | Source: Ambulatory Visit | Attending: Internal Medicine | Admitting: Internal Medicine

## 2018-07-31 DIAGNOSIS — Z1231 Encounter for screening mammogram for malignant neoplasm of breast: Secondary | ICD-10-CM

## 2018-10-03 ENCOUNTER — Ambulatory Visit (INDEPENDENT_AMBULATORY_CARE_PROVIDER_SITE_OTHER): Payer: BLUE CROSS/BLUE SHIELD

## 2018-10-03 ENCOUNTER — Encounter (HOSPITAL_COMMUNITY): Payer: Self-pay

## 2018-10-03 ENCOUNTER — Ambulatory Visit (HOSPITAL_COMMUNITY)
Admission: EM | Admit: 2018-10-03 | Discharge: 2018-10-03 | Disposition: A | Discharge status: Home / Self Care | Payer: BLUE CROSS/BLUE SHIELD | Attending: Family Medicine | Admitting: Family Medicine

## 2018-10-03 DIAGNOSIS — J069 Acute upper respiratory infection, unspecified: Secondary | ICD-10-CM | POA: Insufficient documentation

## 2018-10-03 DIAGNOSIS — B9789 Other viral agents as the cause of diseases classified elsewhere: Secondary | ICD-10-CM | POA: Insufficient documentation

## 2018-10-03 DIAGNOSIS — R05 Cough: Secondary | ICD-10-CM | POA: Diagnosis not present

## 2018-10-03 MED ORDER — BENZONATATE 100 MG PO CAPS: 100.0000 mg | ORAL_CAPSULE | Freq: Three times a day (TID) | ORAL | 0 refills | Status: DC

## 2018-10-03 NOTE — Discharge Instructions (Addendum)
Your x-ray was negative for any pneumonia or bronchitis. This is most likely an viral upper respiratory infection Continue the Mucinex that you have been taking I am to give you Tessalon Perles in addition for cough Follow-up with your doctor for continued worsening symptoms

## 2018-10-03 NOTE — ED Provider Notes (Signed)
[facility]    CSN: [id number] Arrival date & time: 10/03/18  1040     History   Chief Complaint Chief Complaint  Patient presents with  . URI  . Cough    HPI Anne [name] is a 63 y.o. female.   Patient is a 63 year old female past medical history of anxiety, arthritis, hypertension.  She presents with cough, congestion, fatigue.  She has had some waxing and waning symptoms for approximately 2 weeks.  She did get better and then approximately 3 days ago her symptoms returned and worsened.  Reports she has not had any energy and has been laying in the bed.  She is been using Mucinex and NyQuil for her symptoms.  She has had some subjective fevers, body aches, chills.  Patient is concerned because she is having knee replacement surgery next month and does not want anything to inhibit her having the surgery.  She has had some sick contacts from visiting people from her church in the hospital and nursing homes.  Denies any chest pain or shortness of breath.  ROS per HPI    URI  Presenting symptoms: cough   Cough    Past Medical History:  Diagnosis Date  . Anxiety   . Arthritis   . Cataract    hx of  . Hypertension     Patient Active Problem List   Diagnosis Date Noted  . OA (osteoarthritis) of knee 12/26/2016    Past Surgical History:  Procedure Laterality Date  . CATARACT EXTRACTION    . CHOLECYSTECTOMY    . COLONOSCOPY WITH PROPOFOL N/A 11/12/2013   Procedure: COLONOSCOPY WITH PROPOFOL;  Surgeon: Garlan Fair, MD;  Location: [facility];  Service: Endoscopy;  Laterality: N/A;  . EYE SURGERY    . lower back surgery    . RETINAL DETACHMENT SURGERY    . right arm tendon repair  2005  . ROTATOR CUFF REPAIR     both sholulders  . TOTAL KNEE ARTHROPLASTY Left 12/26/2016   Procedure: LEFT TOTAL KNEE ARTHROPLASTY;  Surgeon: Gaynelle Arabian, MD;  Location: [facility];  Service: Orthopedics;  Laterality: Left;  requests 36mins    OB History    Gravida  0   Para      Term      Preterm      AB      Living        SAB      TAB      Ectopic      Multiple      Live Births               Home Medications    Prior to Admission medications   Medication Sig Start Date End Date Taking? Authorizing Provider  acetaminophen (TYLENOL) 325 MG tablet Take 325 mg by mouth every 6 (six) hours as needed (for pain/headache.).    [provider]  ALPRAZolam Duanne Moron) 0.5 MG tablet Take 0.5-1 mg by mouth 2 (two) times daily as needed for sleep or anxiety. 12/06/16   [provider]  atorvastatin (LIPITOR) 20 MG tablet Take 20 mg by mouth at bedtime. 12/05/16   [provider]  benzonatate (TESSALON) 100 MG capsule Take 1 capsule (100 mg total) by mouth every 8 (eight) hours. 10/03/18   Loura Halt A, NP  buPROPion (WELLBUTRIN XL) 300 MG 24 hr tablet Take 300 mg by mouth daily. 11/28/16   [provider]  Carboxymethylcell-Hypromellose (GENTEAL OP) Apply 1 drop to  eye 3 (three) times daily as needed (dry eyes).    [provider]  fluticasone (FLONASE) 50 MCG/ACT nasal spray Place 1 spray into the nose daily as needed for allergies.  11/28/14   [provider]  methocarbamol (ROBAXIN) 500 MG tablet Take 1 tablet (500 mg total) by mouth every 6 (six) hours as needed for muscle spasms. 12/27/16   Perkins, Alexzandrew L, PA-C  oxyCODONE (OXY IR/ROXICODONE) 5 MG immediate release tablet Take 1-2 tablets (5-10 mg total) by mouth every 4 (four) hours as needed for moderate pain or severe pain. 12/27/16   Perkins, Alexzandrew L, PA-C  potassium chloride SA (K-DUR,KLOR-CON) 20 MEQ tablet Take 20 mEq by mouth daily. 12/12/16   [provider]  rivaroxaban (XARELTO) 10 MG TABS tablet Take 1 tablet (10 mg total) by mouth daily with breakfast. Take Xarelto for two and a half more weeks following discharge from the hospital, then discontinue Xarelto. Once the patient has completed the Xarelto, they may resume the  81 mg Aspirin. 12/28/16   Perkins, Alexzandrew L, PA-C  triamterene-hydrochlorothiazide (MAXZIDE) 75-50 MG per tablet Take 0.5 tablets by mouth at bedtime.    [provider]    Family History Family History  Problem Relation Age of Onset  . Diabetes Mother   . Hypertension Mother   . Heart attack Father   . Cancer Father        lung    Social History Social History   Tobacco Use  . Smoking status: Never Smoker  . Smokeless tobacco: Never Used  Substance Use Topics  . Alcohol use: Yes    Comment: very rare use  . Drug use: No     Allergies   Patient has no known allergies.   Review of Systems Review of Systems  Respiratory: Positive for cough.      Physical Exam Triage Vital Signs ED Triage Vitals  Enc Vitals Group     BP 10/03/18 1125 (!) 147/79     Pulse Rate 10/03/18 1125 79     Resp 10/03/18 1125 18     Temp 10/03/18 1125 97.9 F (36.6 C)     Temp Source 10/03/18 1125 Oral     SpO2 10/03/18 1125 97 %     Weight --      Height --      Head Circumference --      Peak Flow --      Pain Score 10/03/18 1144 6     Pain Loc --      Pain Edu? --      Excl. in [place]? --    No data found.  Updated Vital Signs BP (!) 147/79 (BP Location: Left Arm)   Pulse 79   Temp 97.9 F (36.6 C) (Oral)   Resp 18   LMP  (LMP Unknown)   SpO2 97%   Visual Acuity Right Eye Distance:   Left Eye Distance:   Bilateral Distance:    Right Eye Near:   Left Eye Near:    Bilateral Near:     Physical Exam Vitals signs and nursing note reviewed.  Constitutional:      General: She is not in acute distress.    Appearance: Normal appearance. She is not ill-appearing, toxic-appearing or diaphoretic.  HENT:     Head: Normocephalic and atraumatic.     Right Ear: Tympanic membrane, ear canal and external ear normal.     Left Ear: Tympanic membrane, ear canal and external ear normal.  Nose: Congestion and rhinorrhea present.     Mouth/Throat:     Mouth: Mucous  membranes are moist.     Pharynx: Oropharynx is clear. No posterior oropharyngeal erythema.  Eyes:     Conjunctiva/sclera: Conjunctivae normal.  Neck:     Musculoskeletal: Normal range of motion.  Cardiovascular:     Rate and Rhythm: Normal rate and regular rhythm.     Heart sounds: Normal heart sounds.  Pulmonary:     Effort: Pulmonary effort is normal.     Breath sounds: Normal breath sounds.  Musculoskeletal: Normal range of motion.  Lymphadenopathy:     Cervical: No cervical adenopathy.  Skin:    General: Skin is warm and dry.  Neurological:     Mental Status: She is alert.  Psychiatric:        Mood and Affect: Mood normal.      UC Treatments / Results  Labs (all labs ordered are listed, but only abnormal results are displayed) Labs Reviewed - No data to display  EKG None  Radiology Dg Chest 2 View  Result Date: 10/03/2018 CLINICAL DATA:  Patient cough for 2 weeks. EXAM: CHEST - 2 VIEW COMPARISON:  Chest radiograph 326 16 FINDINGS: Cardiac contours upper limits of normal. No consolidative pulmonary opacities. No pleural effusion or pneumothorax. Thoracic spine degenerative changes. IMPRESSION: No acute cardiopulmonary process. Electronically Signed   By: Lovey Newcomer M.D.   On: 10/03/2018 12:27    Procedures Procedures (including critical care time)  Medications Ordered in UC Medications - No data to display  Initial Impression / Assessment and Plan / UC Course  I have reviewed the triage vital signs and the nursing notes.  Pertinent labs & imaging results that were available during my care of the patient were reviewed by me and considered in my medical decision making (see chart for details).     Exam mostly normal, vital signs stable, nontoxic or ill-appearing. X-ray negative for any pneumonia or bronchitis. Most likely viral URI. She is already using Mucinex and NyQuil.  She can continue these medications I am giving prescription for Tessalon Perles for  cough She is seeing her doctor on the 13th Follow up as needed for continued or worsening symptoms  Final Clinical Impressions(s) / UC Diagnoses   Final diagnoses:  Viral URI with cough     Discharge Instructions     Your x-ray was negative for any pneumonia or bronchitis. This is most likely an viral upper respiratory infection Continue the Mucinex that you have been taking I am to give you Tessalon Perles in addition for cough Follow-up with your doctor for continued worsening symptoms    ED Prescriptions    Medication Sig Dispense Auth. Provider   benzonatate (TESSALON) 100 MG capsule Take 1 capsule (100 mg total) by mouth every 8 (eight) hours. 21 capsule Loura Halt A, NP     Controlled Substance Prescriptions [place] Controlled Substance Registry consulted? Not Applicable   Orvan July, NP 10/03/18 1246

## 2018-10-03 NOTE — ED Triage Notes (Signed)
Pt presents with upper respiratory symptoms; persistent cough, congestion, and generalized body aches.

## 2018-10-15 DIAGNOSIS — F418 Other specified anxiety disorders: Secondary | ICD-10-CM | POA: Diagnosis not present

## 2018-10-15 DIAGNOSIS — I10 Essential (primary) hypertension: Secondary | ICD-10-CM | POA: Diagnosis not present

## 2018-10-15 DIAGNOSIS — R0609 Other forms of dyspnea: Secondary | ICD-10-CM | POA: Diagnosis not present

## 2018-10-15 DIAGNOSIS — Z1159 Encounter for screening for other viral diseases: Secondary | ICD-10-CM | POA: Diagnosis not present

## 2018-10-29 NOTE — H&P (Signed)
TOTAL KNEE ADMISSION H&P  Patient is being admitted for right total knee arthroplasty.  Subjective:  Chief Complaint:right knee pain.  HPI: Anne [name] Well, 63 y.o. female, has a history of pain and functional disability in the right knee due to arthritis and has failed non-surgical conservative treatments for greater than 12 weeks to includecorticosteriod injections and activity modification.  Onset of symptoms was gradual, starting 5 years ago with gradually worsening course since that time. The patient noted no past surgery on the right knee(s).  Patient currently rates pain in the right knee(s) at 7 out of 10 with activity. Patient has worsening of pain with activity and weight bearing and crepitus.  Patient has evidence of bone-on-bone arthritis in the medial and patellofemoral compartments with large loose bodies posteriorly by imaging studies. There is no active infection.  Patient Active Problem List   Diagnosis Date Noted  . OA (osteoarthritis) of knee 12/26/2016   Past Medical History:  Diagnosis Date  . Anxiety   . Arthritis   . Cataract    hx of  . Hypertension     Past Surgical History:  Procedure Laterality Date  . CATARACT EXTRACTION    . CHOLECYSTECTOMY    . COLONOSCOPY WITH PROPOFOL N/A 11/12/2013   Procedure: COLONOSCOPY WITH PROPOFOL;  Surgeon: Garlan Fair, MD;  Location: [facility];  Service: Endoscopy;  Laterality: N/A;  . EYE SURGERY    . lower back surgery    . RETINAL DETACHMENT SURGERY    . right arm tendon repair  2005  . ROTATOR CUFF REPAIR     both sholulders  . TOTAL KNEE ARTHROPLASTY Left 12/26/2016   Procedure: LEFT TOTAL KNEE ARTHROPLASTY;  Surgeon: Gaynelle Arabian, MD;  Location: [facility];  Service: Orthopedics;  Laterality: Left;  requests 65mins    No current facility-administered medications for this encounter.    Current Outpatient Medications  Medication Sig Dispense Refill Last Dose  . ALPRAZolam (XANAX) 1 MG tablet Take 1 mg by mouth at  bedtime as needed for sleep.     Marland Kitchen aspirin EC 81 MG tablet Take 81 mg by mouth daily.     Marland Kitchen aspirin-acetaminophen-caffeine (EXCEDRIN MIGRAINE) 250-250-65 MG tablet Take 2 tablets by mouth 3 (three) times daily as needed for headache or migraine.     Marland Kitchen atorvastatin (LIPITOR) 20 MG tablet Take 20 mg by mouth at bedtime.  2 1+ week ago  . benzonatate (TESSALON) 100 MG capsule Take 1 capsule (100 mg total) by mouth every 8 (eight) hours. (Patient taking differently: Take 100 mg by mouth every 8 (eight) hours as needed for cough. ) 21 capsule 0   . BLACK CURRANT SEED OIL PO Take 1,250 mg by mouth daily.     Marland Kitchen buPROPion (WELLBUTRIN XL) 150 MG 24 hr tablet Take 150 mg by mouth daily.     . calcium carbonate (TUMS - DOSED IN MG ELEMENTAL CALCIUM) 500 MG chewable tablet Chew 1-2 tablets by mouth daily as needed for indigestion or heartburn.     . Calcium Carbonate-Vitamin D (CALTRATE 600+D PO) Take 1 tablet by mouth daily.     . Carboxymethylcell-Hypromellose (GENTEAL OP) Apply 1 drop to eye 3 (three) times daily as needed (dry eyes).   Past Month at Unknown time  . Feverfew 380 MG CAPS Take 380 mg by mouth daily.     Marland Kitchen GARCINIA CAMBOGIA-CHROMIUM PO Take 2,100 mg by mouth 3 (three) times daily.     Marland Kitchen ibuprofen (ADVIL,MOTRIN) 200 MG tablet  Take 400 mg by mouth 2 (two) times daily as needed for moderate pain.     . Multiple Vitamin (MULTIVITAMIN WITH MINERALS) TABS tablet Take 1 tablet by mouth daily.     . Omega-3 Fatty Acids (FISH OIL PO) Take 720 mg by mouth daily.     Marland Kitchen OVER THE COUNTER MEDICATION Take 1 tablet by mouth 3 (three) times a week. [place] otc supplement     . potassium chloride SA (K-DUR,KLOR-CON) 20 MEQ tablet Take 20 mEq by mouth at bedtime.   3 12/22/2016  . sodium chloride (OCEAN) 0.65 % SOLN nasal spray Place 1 spray into both nostrils as needed for congestion.     . traZODone (DESYREL) 150 MG tablet Take 150 mg by mouth at bedtime as needed for sleep.     Marland Kitchen  triamterene-hydrochlorothiazide (MAXZIDE) 75-50 MG per tablet Take 0.5 tablets by mouth daily.    12/26/2016 at 0800  . vitamin C (ASCORBIC ACID) 250 MG tablet Take 500 mg by mouth daily.     . rivaroxaban (XARELTO) 10 MG TABS tablet Take 1 tablet (10 mg total) by mouth daily with breakfast. Take Xarelto for two and a half more weeks following discharge from the hospital, then discontinue Xarelto. Once the patient has completed the Xarelto, they may resume the 81 mg Aspirin. (Patient not taking: Reported on 10/18/2018) 19 tablet 0 Completed Course at Unknown time   No Known Allergies  Social History   Tobacco Use  . Smoking status: Never Smoker  . Smokeless tobacco: Never Used  Substance Use Topics  . Alcohol use: Yes    Comment: very rare use    Family History  Problem Relation Age of Onset  . Diabetes Mother   . Hypertension Mother   . Heart attack Father   . Cancer Father        lung     Review of Systems  Constitutional: Negative for chills and fever.  HENT: Negative for congestion, sore throat and tinnitus.   Eyes: Negative for double vision, photophobia and pain.  Respiratory: Negative for cough, shortness of breath and wheezing.   Cardiovascular: Negative for chest pain, palpitations and orthopnea.  Gastrointestinal: Negative for heartburn, nausea and vomiting.  Genitourinary: Negative for dysuria, frequency and urgency.  Musculoskeletal: Positive for joint pain.  Neurological: Negative for dizziness, weakness and headaches.    Objective:  Physical Exam  Well nourished and well developed. General: Alert and oriented x3, cooperative and pleasant, no acute distress. Head: normocephalic, atraumatic, neck supple. Eyes: EOMI. Respiratory: breath sounds clear in all fields, no wheezing, rales, or rhonchi. Cardiovascular: Regular rate and rhythm, no murmurs, gallops or rubs.  Abdomen: non-tender to palpation and soft, normoactive bowel sounds.  Musculoskeletal: Right  knee exam: No effusion. Varus deformity.  Range of motion is 0-125 degrees.  Moderate crepitus on range of motion of the knee.  Tenderness present, greater in the medial than the lateral joint lines.  Stable knee.  Calves soft and nontender. Motor function intact in LE. Strength 5/5 LE bilaterally. Neuro: Distal pulses 2+. Sensation to light touch intact in LE.  Vital signs in last 24 hours: Blood pressure: 122/90 mmHg Pulse: 70 bpm  Labs:   Estimated body mass index is 33.52 kg/m as calculated from the following:   Height as of 12/26/16: 5\' 7"  (1.702 m).   Weight as of 12/26/16: 97.1 kg.   Imaging Review Plain radiographs demonstrate severe degenerative joint disease of the right knee(s). The overall alignment isneutral.  The bone quality appears to be adequate for age and reported activity level.   Preoperative templating of the joint replacement has been completed, documented, and submitted to the Operating Room personnel in order to optimize intra-operative equipment management.   Anticipated LOS equal to or greater than 2 midnights due to - Age 72 and older with one or more of the following:  - Obesity  - Expected need for hospital services (PT, OT, Nursing) required for safe  discharge  - Anticipated need for postoperative skilled nursing care or inpatient rehab  - Active co-morbidities: None OR   - Unanticipated findings during/Post Surgery: None  - Patient is a high risk of re-admission due to: None     Assessment/Plan:  End stage arthritis, right knee   The patient history, physical examination, clinical judgment of the provider and imaging studies are consistent with end stage degenerative joint disease of the right knee(s) and total knee arthroplasty is deemed medically necessary. The treatment options including medical management, injection therapy arthroscopy and arthroplasty were discussed at length. The risks and benefits of total knee arthroplasty were  presented and reviewed. The risks due to aseptic loosening, infection, stiffness, patella tracking problems, thromboembolic complications and other imponderables were discussed. The patient acknowledged the explanation, agreed to proceed with the plan and consent was signed. Patient is being admitted for inpatient treatment for surgery, pain control, PT, OT, prophylactic antibiotics, VTE prophylaxis, progressive ambulation and ADL's and discharge planning. The patient is planning to be discharged home.   Therapy Plans: outpatient therapy at [facility] Disposition: Home with mom and sister Planned DVT Prophylaxis: aspirin 325mg  BID DME needed: none PCP: Dr. Fara Olden, preoperative clearance received TXA: IV Allergies: none Anesthesia Concerns: none BMI: 35.7  - Patient was instructed on what medications to stop prior to surgery. - Follow-up visit in 2 weeks with Dr. Wynelle Link - Begin physical therapy following surgery - Pre-operative lab work as pre-surgical testing - Prescriptions will be provided in hospital at time of discharge  Theresa Duty, PA-C Orthopedic Surgery [facility] [place]

## 2018-10-30 ENCOUNTER — Encounter (HOSPITAL_COMMUNITY): Payer: Self-pay

## 2018-10-30 NOTE — Patient Instructions (Signed)
Your procedure is scheduled on:  Monday, Feb. 3, 2020   Surgery Time:  8:20AM-9:10AM   Report to [facility]  Entrance    Report to admitting at 5:45 AM   Call this number if you have problems the morning of surgery [phone]   Do not eat food or drink liquids :After Midnight.   Brush your teeth the morning of surgery.   Do NOT smoke after Midnight   Take these medicines the morning of surgery with A SIP OF WATER: Bupropion                               You may not have any metal on your body including hair pins, jewelry, and body piercings             Do not wear make-up, lotions, powders, perfumes/cologne, or deodorant             Do not wear nail polish.  Do not shave  48 hours prior to surgery.               Do not bring valuables to the hospital. [place].   Contacts, dentures or bridgework may not be worn into surgery.   Leave suitcase in the car. After surgery it may be brought to your room.    Special Instructions: Bring a copy of your healthcare power of attorney and living will documents         the day of surgery if you haven't scanned them in before.              Please read over the following fact sheets you were given:  [facility] - Preparing for Surgery Before surgery, you can play an important role.  Because skin is not sterile, your skin needs to be as free of germs as possible.  You can reduce the number of germs on your skin by washing with CHG (chlorahexidine gluconate) soap before surgery.  CHG is an antiseptic cleaner which kills germs and bonds with the skin to continue killing germs even after washing. Please DO NOT use if you have an allergy to CHG or antibacterial soaps.  If your skin becomes reddened/irritated stop using the CHG and inform your nurse when you arrive at Short Stay. Do not shave (including legs and underarms) for at least 48 hours prior to the first CHG shower.  You  may shave your face/neck.  Please follow these instructions carefully:  1.  Shower with CHG Soap the night before surgery and the  morning of surgery.  2.  If you choose to wash your hair, wash your hair first as usual with your normal  shampoo.  3.  After you shampoo, rinse your hair and body thoroughly to remove the shampoo.                             4.  Use CHG as you would any other liquid soap.  You can apply chg directly to the skin and wash.  Gently with a scrungie or clean washcloth.  5.  Apply the CHG Soap to your body ONLY FROM THE NECK DOWN.   Do   not use on face/ open  Wound or open sores. Avoid contact with eyes, ears mouth and   genitals (private parts).                       Wash face,  Genitals (private parts) with your normal soap.             6.  Wash thoroughly, paying special attention to the area where your    surgery  will be performed.  7.  Thoroughly rinse your body with warm water from the neck down.  8.  DO NOT shower/wash with your normal soap after using and rinsing off the CHG Soap.                9.  Pat yourself dry with a clean towel.            10.  Wear clean pajamas.            11.  Place clean sheets on your bed the night of your first shower and do not  sleep with pets. Day of Surgery : Do not apply any lotions/deodorants the morning of surgery.  Please wear clean clothes to the hospital/surgery center.  FAILURE TO FOLLOW THESE INSTRUCTIONS MAY RESULT IN THE CANCELLATION OF YOUR SURGERY  PATIENT SIGNATURE_________________________________  NURSE SIGNATURE__________________________________  ________________________________________________________________________   Anne [name]  An incentive spirometer is a tool that can help keep your lungs clear and active. This tool measures how well you are filling your lungs with each breath. Taking long deep breaths may help reverse or decrease the chance of developing breathing  (pulmonary) problems (especially infection) following:  A long period of time when you are unable to move or be active. BEFORE THE PROCEDURE   If the spirometer includes an indicator to show your best effort, your nurse or respiratory therapist will set it to a desired goal.  If possible, sit up straight or lean slightly forward. Try not to slouch.  Hold the incentive spirometer in an upright position. INSTRUCTIONS FOR USE  1. Sit on the edge of your bed if possible, or sit up as far as you can in bed or on a chair. 2. Hold the incentive spirometer in an upright position. 3. Breathe out normally. 4. Place the mouthpiece in your mouth and seal your lips tightly around it. 5. Breathe in slowly and as deeply as possible, raising the piston or the ball toward the top of the column. 6. Hold your breath for 3-5 seconds or for as long as possible. Allow the piston or ball to fall to the bottom of the column. 7. Remove the mouthpiece from your mouth and breathe out normally. 8. Rest for a few seconds and repeat Steps 1 through 7 at least 10 times every 1-2 hours when you are awake. Take your time and take a few normal breaths between deep breaths. 9. The spirometer may include an indicator to show your best effort. Use the indicator as a goal to work toward during each repetition. 10. After each set of 10 deep breaths, practice coughing to be sure your lungs are clear. If you have an incision (the cut made at the time of surgery), support your incision when coughing by placing a pillow or rolled up towels firmly against it. Once you are able to get out of bed, walk around indoors and cough well. You may stop using the incentive spirometer when instructed by your caregiver.  RISKS AND COMPLICATIONS  Take your time  so you do not get dizzy or light-headed.  If you are in pain, you may need to take or ask for pain medication before doing incentive spirometry. It is harder to take a deep breath if you  are having pain. AFTER USE  Rest and breathe slowly and easily.  It can be helpful to keep track of a log of your progress. Your caregiver can provide you with a simple table to help with this. If you are using the spirometer at home, follow these instructions: [name] IF:   You are having difficultly using the spirometer.  You have trouble using the spirometer as often as instructed.  Your pain medication is not giving enough relief while using the spirometer.  You develop fever of 100.5 F (38.1 C) or higher. SEEK IMMEDIATE MEDICAL CARE IF:   You cough up bloody sputum that had not been present before.  You develop fever of 102 F (38.9 C) or greater.  You develop worsening pain at or near the incision site. MAKE SURE YOU:   Understand these instructions.  Will watch your condition.  Will get help right away if you are not doing well or get worse. Document Released: 01/30/2007 Document Revised: 12/12/2011 Document Reviewed: 04/02/2007 ExitCare Patient Information 2014 Exit[employer], [place].   ________________________________________________________________________  WHAT IS A BLOOD TRANSFUSION? Blood Transfusion Information  A transfusion is the replacement of blood or some of its parts. Blood is made up of multiple cells which provide different functions.  Red blood cells carry oxygen and are used for blood loss replacement.  White blood cells fight against infection.  Platelets control bleeding.  Plasma helps clot blood.  Other blood products are available for specialized needs, such as hemophilia or other clotting disorders. BEFORE THE TRANSFUSION  Who gives blood for transfusions?   Healthy volunteers who are fully evaluated to make sure their blood is safe. This is blood bank blood. Transfusion therapy is the safest it has ever been in the practice of medicine. Before blood is taken from a donor, a complete history is taken to make sure that person has  no history of diseases nor engages in risky social behavior (examples are intravenous drug use or sexual activity with multiple partners). The donor's travel history is screened to minimize risk of transmitting infections, such as malaria. The donated blood is tested for signs of infectious diseases, such as HIV and hepatitis. The blood is then tested to be sure it is compatible with you in order to minimize the chance of a transfusion reaction. If you or a relative donates blood, this is often done in anticipation of surgery and is not appropriate for emergency situations. It takes many days to process the donated blood. RISKS AND COMPLICATIONS Although transfusion therapy is very safe and saves many lives, the main dangers of transfusion include:   Getting an infectious disease.  Developing a transfusion reaction. This is an allergic reaction to something in the blood you were given. Every precaution is taken to prevent this. The decision to have a blood transfusion has been considered carefully by your caregiver before blood is given. Blood is not given unless the benefits outweigh the risks. AFTER THE TRANSFUSION  Right after receiving a blood transfusion, you will usually feel much better and more energetic. This is especially true if your red blood cells have gotten low (anemic). The transfusion raises the level of the red blood cells which carry oxygen, and this usually causes an energy increase.  The  nurse administering the transfusion will monitor you carefully for complications. HOME CARE INSTRUCTIONS  No special instructions are needed after a transfusion. You may find your energy is better. Speak with your caregiver about any limitations on activity for underlying diseases you may have. SEEK MEDICAL CARE IF:   Your condition is not improving after your transfusion.  You develop redness or irritation at the intravenous (IV) site. SEEK IMMEDIATE MEDICAL CARE IF:  Any of the following  symptoms occur over the next 12 hours:  Shaking chills.  You have a temperature by mouth above 102 F (38.9 C), not controlled by medicine.  Chest, back, or muscle pain.  People around you feel you are not acting correctly or are confused.  Shortness of breath or difficulty breathing.  Dizziness and fainting.  You get a rash or develop hives.  You have a decrease in urine output.  Your urine turns a dark color or changes to pink, red, or brown. Any of the following symptoms occur over the next 10 days:  You have a temperature by mouth above 102 F (38.9 C), not controlled by medicine.  Shortness of breath.  Weakness after normal activity.  The white part of the eye turns yellow (jaundice).  You have a decrease in the amount of urine or are urinating less often.  Your urine turns a dark color or changes to pink, red, or brown. Document Released: 09/16/2000 Document Revised: 12/12/2011 Document Reviewed: 05/05/2008 [facility] Patient Information 2014 [place], [place].  _______________________________________________________________________

## 2018-10-30 NOTE — Pre-Procedure Instructions (Signed)
Surgical clearance Dr. Fara Olden 08/16/2018  CXR 10/03/2018 in epic

## 2018-10-31 ENCOUNTER — Encounter (HOSPITAL_COMMUNITY)
Admission: RE | Admit: 2018-10-31 | Discharge: 2018-10-31 | Disposition: A | Discharge status: Home / Self Care | Payer: BLUE CROSS/BLUE SHIELD | Source: Ambulatory Visit | Attending: Orthopedic Surgery | Admitting: Orthopedic Surgery

## 2018-10-31 ENCOUNTER — Other Ambulatory Visit: Payer: Self-pay

## 2018-10-31 ENCOUNTER — Encounter (HOSPITAL_COMMUNITY): Payer: Self-pay

## 2018-10-31 DIAGNOSIS — Z01812 Encounter for preprocedural laboratory examination: Secondary | ICD-10-CM | POA: Diagnosis not present

## 2018-10-31 HISTORY — DX: Legal blindness, as defined in USA: H54.8

## 2018-10-31 HISTORY — DX: Hyperlipidemia, unspecified: E78.5

## 2018-10-31 HISTORY — DX: Major depressive disorder, single episode, unspecified: F32.9

## 2018-10-31 HISTORY — DX: Morbid (severe) obesity due to excess calories: E66.01

## 2018-10-31 HISTORY — DX: Other chronic pain: G89.29

## 2018-10-31 HISTORY — DX: Other chronic pain: M54.50

## 2018-10-31 HISTORY — DX: Pneumonia, unspecified organism: J18.9

## 2018-10-31 HISTORY — DX: Migraine, unspecified, not intractable, without status migrainosus: G43.909

## 2018-10-31 HISTORY — DX: Gastro-esophageal reflux disease without esophagitis: K21.9

## 2018-10-31 HISTORY — DX: Low back pain: M54.5

## 2018-10-31 HISTORY — DX: Deviated nasal septum: J34.2

## 2018-10-31 HISTORY — DX: Personal history of other diseases of the digestive system: Z87.19

## 2018-10-31 LAB — CBC WITH DIFFERENTIAL/PLATELET
Abs Immature Granulocytes: 0.03 10*3/uL (ref 0.00–0.07)
Basophils Absolute: 0 10*3/uL (ref 0.0–0.1)
Basophils Relative: 1 %
Eosinophils Absolute: 0.2 10*3/uL (ref 0.0–0.5)
Eosinophils Relative: 4 %
HCT: 41.2 % (ref 36.0–46.0)
Hemoglobin: 13.4 g/dL (ref 12.0–15.0)
Immature Granulocytes: 1 %
Lymphocytes Relative: 30 %
Lymphs Abs: 1.6 10*3/uL (ref 0.7–4.0)
MCH: 30.2 pg (ref 26.0–34.0)
MCHC: 32.5 g/dL (ref 30.0–36.0)
MCV: 93 fL (ref 80.0–100.0)
Monocytes Absolute: 0.3 10*3/uL (ref 0.1–1.0)
Monocytes Relative: 5 %
Neutro Abs: 3.2 10*3/uL (ref 1.7–7.7)
Neutrophils Relative %: 59 %
Platelets: 183 10*3/uL (ref 150–400)
RBC: 4.43 MIL/uL (ref 3.87–5.11)
RDW: 13.5 % (ref 11.5–15.5)
WBC: 5.4 10*3/uL (ref 4.0–10.5)
nRBC: 0 % (ref 0.0–0.2)

## 2018-10-31 LAB — URINALYSIS, ROUTINE W REFLEX MICROSCOPIC
Bilirubin Urine: NEGATIVE
Glucose, UA: NEGATIVE mg/dL
Hgb urine dipstick: NEGATIVE
Ketones, ur: NEGATIVE mg/dL
Leukocytes, UA: NEGATIVE
Nitrite: NEGATIVE
Protein, ur: NEGATIVE mg/dL
Specific Gravity, Urine: 1.012 (ref 1.005–1.030)
pH: 7 (ref 5.0–8.0)

## 2018-10-31 LAB — COMPREHENSIVE METABOLIC PANEL
ALT: 24 U/L (ref 0–44)
AST: 23 U/L (ref 15–41)
Albumin: 4.5 g/dL (ref 3.5–5.0)
Alkaline Phosphatase: 86 U/L (ref 38–126)
Anion gap: 8 (ref 5–15)
BUN: 18 mg/dL (ref 8–23)
CO2: 26 mmol/L (ref 22–32)
Calcium: 10.2 mg/dL (ref 8.9–10.3)
Chloride: 105 mmol/L (ref 98–111)
Creatinine, Ser: 0.62 mg/dL (ref 0.44–1.00)
GFR calc Af Amer: >60 mL/min (ref >60)
GFR calc non Af Amer: >60 mL/min (ref >60)
Glucose, Bld: 107 mg/dL — ABNORMAL HIGH (ref 70–99)
Potassium: 3.9 mmol/L (ref 3.5–5.1)
Sodium: 139 mmol/L (ref 135–145)
Total Bilirubin: 1.2 mg/dL (ref 0.3–1.2)
Total Protein: 7.5 g/dL (ref 6.5–8.1)

## 2018-10-31 LAB — PROTIME-INR
INR: 0.85
Prothrombin Time: 11.6 seconds (ref 11.4–15.2)

## 2018-10-31 LAB — SURGICAL PCR SCREEN
MRSA, PCR: POSITIVE — AB
Staphylococcus aureus: POSITIVE — AB

## 2018-10-31 LAB — APTT: aPTT: 24 seconds (ref 24–36)

## 2018-10-31 NOTE — Pre-Procedure Instructions (Signed)
EKG 10/15/2018 in hard chart

## 2018-10-31 NOTE — Pre-Procedure Instructions (Signed)
UA and PCR results 10/31/2018 sent to Dr. Wynelle Link via epic.

## 2018-11-02 NOTE — Progress Notes (Signed)
Anesthesia Chart Review   Case:  [id number] Date/Time:  11/05/18 0805   Procedure:  TOTAL KNEE ARTHROPLASTY (Right ) - 57min   Anesthesia type:  Choice   Pre-op diagnosis:  right knee osteoarthritis   Location:  [facility] ROOM 10 / [facility]   Surgeon:  Gaynelle Arabian, MD      DISCUSSION: 63 yo never smoker with h/o HTN, hiatal hernia, migraines, depression, anxiety, GERD, legally blind right eye, right knee OA scheduled for above surgery on 11/05/18 with Dr. Gaynelle Arabian.   Clearance received from PCP, Dr. Leeroy Cha, on 08/16/18 (on chart) which states pt is low risk for surgical procedure.  Last seen by PCP on 01/08/18, stable at this visit.   Pt is cleared for planned procedure barring acute status change.  VS: BP (!) 148/88   Pulse 73   Temp 36.9 C (Oral)   Resp 14   Ht 5' 6.5" (1.689 m)   Wt 104.8 kg   LMP  (LMP Unknown)   SpO2 96%   BMI 36.73 kg/m   PROVIDERS: Leeroy Cha, MD is PCP    LABS: Labs reviewed: Acceptable for surgery. (all labs ordered are listed, but only abnormal results are displayed)  Labs Reviewed  SURGICAL PCR SCREEN - Abnormal; Notable for the following components:      Result Value   MRSA, PCR POSITIVE (*)    Staphylococcus aureus POSITIVE (*)    All other components within normal limits  COMPREHENSIVE METABOLIC PANEL - Abnormal; Notable for the following components:   Glucose, Bld 107 (*)    All other components within normal limits  URINALYSIS, ROUTINE W REFLEX MICROSCOPIC - Abnormal; Notable for the following components:   Color, Urine STRAW (*)    All other components within normal limits  APTT  CBC WITH DIFFERENTIAL/PLATELET  PROTIME-INR  TYPE AND SCREEN     IMAGES: Chest Xray 10/03/18 FINDINGS: Cardiac contours upper limits of normal. No consolidative pulmonary opacities. No pleural effusion or pneumothorax. Thoracic spine degenerative changes.  IMPRESSION: No acute cardiopulmonary process.  EKG: 10/15/18 Rate 65  bpm Sinus rhythm  Within normal limits  CV:  Past Medical History:  Diagnosis Date  . Anxiety   . Arthritis   . Cataract    hx of  . Chronic low back pain   . Deviated septum   . Dyslipidemia   . GERD (gastroesophageal reflux disease)    occ  . History of hiatal hernia   . Hypertension   . Legally blind    Right eye  . Major depression   . Migraines   . Morbid obesity ([place])   . Pneumonia     Past Surgical History:  Procedure Laterality Date  . CATARACT EXTRACTION, BILATERAL    . CHOLECYSTECTOMY    . COLONOSCOPY WITH PROPOFOL N/A 11/12/2013   Procedure: COLONOSCOPY WITH PROPOFOL;  Surgeon: Garlan Fair, MD;  Location: [facility];  Service: Endoscopy;  Laterality: N/A;  . lower back surgery    . RETINAL DETACHMENT SURGERY Right    x2  . right arm tendon repair  2005  . ROTATOR CUFF REPAIR Bilateral    both sholulders  . TOTAL KNEE ARTHROPLASTY Left 12/26/2016   Procedure: LEFT TOTAL KNEE ARTHROPLASTY;  Surgeon: Gaynelle Arabian, MD;  Location: [facility];  Service: Orthopedics;  Laterality: Left;  requests 34mins    MEDICATIONS: . ALPRAZolam (XANAX) 1 MG tablet  . aspirin EC 81 MG tablet  . aspirin-acetaminophen-caffeine (EXCEDRIN MIGRAINE) 250-250-65 MG tablet  .  atorvastatin (LIPITOR) 20 MG tablet  . benzonatate (TESSALON) 100 MG capsule  . BLACK CURRANT SEED OIL PO  . buPROPion (WELLBUTRIN XL) 150 MG 24 hr tablet  . calcium carbonate (TUMS - DOSED IN MG ELEMENTAL CALCIUM) 500 MG chewable tablet  . Calcium Carbonate-Vitamin D (CALTRATE 600+D PO)  . Carboxymethylcell-Hypromellose (Sedan OP)  . Feverfew 380 MG CAPS  . GARCINIA CAMBOGIA-CHROMIUM PO  . ibuprofen (ADVIL,MOTRIN) 200 MG tablet  . Multiple Vitamin (MULTIVITAMIN WITH MINERALS) TABS tablet  . Omega-3 Fatty Acids (FISH OIL PO)  . OVER THE COUNTER MEDICATION  . potassium chloride SA (K-DUR,KLOR-CON) 20 MEQ tablet  . rivaroxaban (XARELTO) 10 MG TABS tablet  . sodium chloride (OCEAN) 0.65 % SOLN nasal  spray  . traZODone (DESYREL) 150 MG tablet  . triamterene-hydrochlorothiazide (MAXZIDE) 75-50 MG per tablet  . vitamin C (ASCORBIC ACID) 250 MG tablet   No current facility-administered medications for this encounter.      Maia Plan [facility] Pre-Surgical Testing [phone] 11/02/18 2:33 PM

## 2018-11-02 NOTE — Anesthesia Preprocedure Evaluation (Addendum)
Anesthesia Evaluation  Patient identified by MRN, date of birth, ID band Patient awake    Reviewed: Allergy & Precautions, NPO status , Patient's Chart, lab work & pertinent test results  Airway Mallampati: III  TM Distance: >3 FB Neck ROM: Full    Dental no notable dental hx. (+) Teeth Intact   Pulmonary pneumonia, resolved,    Pulmonary exam normal breath sounds clear to auscultation       Cardiovascular hypertension, Pt. on medications Normal cardiovascular exam Rhythm:Regular Rate:Normal     Neuro/Psych  Headaches, PSYCHIATRIC DISORDERS Anxiety Depression Legally blind OD    GI/Hepatic Neg liver ROS, hiatal hernia, GERD  Medicated and Controlled,  Endo/Other  Obesity Hyperlipidemia  Renal/GU negative Renal ROS  negative genitourinary   Musculoskeletal  (+) Arthritis , Osteoarthritis,  Right knee OA Chronic LBP   Abdominal (+) + obese,   Peds  Hematology Has not taken anticoagulants for several months   Anesthesia Other Findings   Reproductive/Obstetrics                         Anesthesia Physical Anesthesia Plan  ASA: III  Anesthesia Plan: Spinal   Post-op Pain Management:  [facility] for Post-op pain   Induction: Intravenous  PONV Risk Score and Plan: 3 and Ondansetron, Dexamethasone, Treatment may vary due to age or medical condition, Propofol infusion and Midazolam  Airway Management Planned: Natural Airway, Simple Face Mask and Nasal Cannula  Additional Equipment:   Intra-op Plan:   Post-operative Plan:   Informed Consent: I have reviewed the patients History and Physical, chart, labs and discussed the procedure including the risks, benefits and alternatives for the proposed anesthesia with the patient or authorized representative who has indicated his/her understanding and acceptance.     Dental advisory given  Plan Discussed with: CRNA and Surgeon  Anesthesia Plan  Comments: (See PST note 10/31/18, Konrad Felix, PA-C)      Anesthesia Quick Evaluation

## 2018-11-04 MED ORDER — VANCOMYCIN HCL 10 G IV SOLR
1500.0000 mg | Freq: Once | INTRAVENOUS | Status: AC
  Administered 2018-11-05: 1500 mg via INTRAVENOUS
  Filled 2018-11-04: qty 1500

## 2018-11-04 MED ORDER — BUPIVACAINE LIPOSOME 1.3 % IJ SUSP
20.0000 mL | Freq: Once | INTRAMUSCULAR | Status: DC
  Filled 2018-11-04: qty 20

## 2018-11-05 ENCOUNTER — Encounter (HOSPITAL_COMMUNITY)
Admission: RE | Disposition: A | Discharge status: Home / Self Care | Payer: Self-pay | Source: Home / Self Care | Attending: Orthopedic Surgery

## 2018-11-05 ENCOUNTER — Inpatient Hospital Stay (HOSPITAL_COMMUNITY)
Admission: RE | Admit: 2018-11-05 | Discharge: 2018-11-06 | DRG: 470 | Disposition: A | Discharge status: Home / Self Care | Payer: BLUE CROSS/BLUE SHIELD | Attending: Orthopedic Surgery | Admitting: Orthopedic Surgery

## 2018-11-05 ENCOUNTER — Encounter (HOSPITAL_COMMUNITY): Payer: Self-pay | Admitting: *Deleted

## 2018-11-05 ENCOUNTER — Inpatient Hospital Stay (HOSPITAL_COMMUNITY): Payer: BLUE CROSS/BLUE SHIELD | Admitting: Anesthesiology

## 2018-11-05 ENCOUNTER — Inpatient Hospital Stay (HOSPITAL_COMMUNITY): Payer: BLUE CROSS/BLUE SHIELD | Admitting: Physician Assistant

## 2018-11-05 ENCOUNTER — Other Ambulatory Visit: Payer: Self-pay

## 2018-11-05 DIAGNOSIS — G43909 Migraine, unspecified, not intractable, without status migrainosus: Secondary | ICD-10-CM | POA: Diagnosis not present

## 2018-11-05 DIAGNOSIS — Z79899 Other long term (current) drug therapy: Secondary | ICD-10-CM

## 2018-11-05 DIAGNOSIS — Z809 Family history of malignant neoplasm, unspecified: Secondary | ICD-10-CM

## 2018-11-05 DIAGNOSIS — H548 Legal blindness, as defined in USA: Secondary | ICD-10-CM | POA: Diagnosis not present

## 2018-11-05 DIAGNOSIS — J342 Deviated nasal septum: Secondary | ICD-10-CM | POA: Diagnosis not present

## 2018-11-05 DIAGNOSIS — Z791 Long term (current) use of non-steroidal anti-inflammatories (NSAID): Secondary | ICD-10-CM | POA: Diagnosis not present

## 2018-11-05 DIAGNOSIS — F419 Anxiety disorder, unspecified: Secondary | ICD-10-CM | POA: Diagnosis not present

## 2018-11-05 DIAGNOSIS — M179 Osteoarthritis of knee, unspecified: Secondary | ICD-10-CM | POA: Diagnosis present

## 2018-11-05 DIAGNOSIS — Z833 Family history of diabetes mellitus: Secondary | ICD-10-CM

## 2018-11-05 DIAGNOSIS — K219 Gastro-esophageal reflux disease without esophagitis: Secondary | ICD-10-CM | POA: Diagnosis present

## 2018-11-05 DIAGNOSIS — Z6837 Body mass index (BMI) 37.0-37.9, adult: Secondary | ICD-10-CM | POA: Diagnosis not present

## 2018-11-05 DIAGNOSIS — I10 Essential (primary) hypertension: Secondary | ICD-10-CM | POA: Diagnosis present

## 2018-11-05 DIAGNOSIS — Z7982 Long term (current) use of aspirin: Secondary | ICD-10-CM | POA: Diagnosis not present

## 2018-11-05 DIAGNOSIS — G8929 Other chronic pain: Secondary | ICD-10-CM | POA: Diagnosis not present

## 2018-11-05 DIAGNOSIS — Z96652 Presence of left artificial knee joint: Secondary | ICD-10-CM | POA: Diagnosis not present

## 2018-11-05 DIAGNOSIS — M1710 Unilateral primary osteoarthritis, unspecified knee: Secondary | ICD-10-CM | POA: Diagnosis present

## 2018-11-05 DIAGNOSIS — M545 Low back pain: Secondary | ICD-10-CM | POA: Diagnosis present

## 2018-11-05 DIAGNOSIS — F329 Major depressive disorder, single episode, unspecified: Secondary | ICD-10-CM | POA: Diagnosis not present

## 2018-11-05 DIAGNOSIS — E785 Hyperlipidemia, unspecified: Secondary | ICD-10-CM | POA: Diagnosis present

## 2018-11-05 DIAGNOSIS — Z9849 Cataract extraction status, unspecified eye: Secondary | ICD-10-CM

## 2018-11-05 DIAGNOSIS — M1711 Unilateral primary osteoarthritis, right knee: Secondary | ICD-10-CM | POA: Diagnosis not present

## 2018-11-05 DIAGNOSIS — Z8249 Family history of ischemic heart disease and other diseases of the circulatory system: Secondary | ICD-10-CM

## 2018-11-05 DIAGNOSIS — G8918 Other acute postprocedural pain: Secondary | ICD-10-CM | POA: Diagnosis not present

## 2018-11-05 HISTORY — PX: TOTAL KNEE ARTHROPLASTY: SHX125

## 2018-11-05 LAB — TYPE AND SCREEN
ABO/RH(D): O POS
Antibody Screen: NEGATIVE

## 2018-11-05 SURGERY — ARTHROPLASTY, KNEE, TOTAL
Anesthesia: Spinal | Laterality: Right

## 2018-11-05 MED ORDER — TRIAMTERENE-HCTZ 75-50 MG PO TABS
0.5000 | ORAL_TABLET | Freq: Every day | ORAL | Status: DC
  Administered 2018-11-06: 0.5 via ORAL
  Filled 2018-11-05: qty 0.5

## 2018-11-05 MED ORDER — 0.9 % SODIUM CHLORIDE (POUR BTL) OPTIME
TOPICAL | Status: DC | PRN
  Administered 2018-11-05: 1000 mL

## 2018-11-05 MED ORDER — DEXAMETHASONE SODIUM PHOSPHATE 10 MG/ML IJ SOLN
INTRAMUSCULAR | Status: AC
  Filled 2018-11-05: qty 1

## 2018-11-05 MED ORDER — ATORVASTATIN CALCIUM 20 MG PO TABS
20.0000 mg | ORAL_TABLET | Freq: Every day | ORAL | Status: DC
  Administered 2018-11-05: 20 mg via ORAL
  Filled 2018-11-05: qty 1

## 2018-11-05 MED ORDER — MENTHOL 3 MG MT LOZG: 1.0000 | LOZENGE | OROMUCOSAL | Status: DC | PRN

## 2018-11-05 MED ORDER — EPHEDRINE SULFATE-NACL 50-0.9 MG/10ML-% IV SOSY
PREFILLED_SYRINGE | INTRAVENOUS | Status: DC | PRN
  Administered 2018-11-05: 5 mg via INTRAVENOUS

## 2018-11-05 MED ORDER — MEPERIDINE HCL 50 MG/ML IJ SOLN: 6.2500 mg | INTRAMUSCULAR | Status: DC | PRN

## 2018-11-05 MED ORDER — BENZONATATE 100 MG PO CAPS: 100.0000 mg | ORAL_CAPSULE | Freq: Three times a day (TID) | ORAL | Status: DC | PRN

## 2018-11-05 MED ORDER — ONDANSETRON HCL 4 MG PO TABS: 4.0000 mg | ORAL_TABLET | Freq: Four times a day (QID) | ORAL | Status: DC | PRN

## 2018-11-05 MED ORDER — ACETAMINOPHEN 500 MG PO TABS
1000.0000 mg | ORAL_TABLET | Freq: Four times a day (QID) | ORAL | Status: AC
  Administered 2018-11-05 – 2018-11-06 (×3): 1000 mg via ORAL
  Filled 2018-11-05 (×4): qty 2

## 2018-11-05 MED ORDER — METHOCARBAMOL 500 MG PO TABS
500.0000 mg | ORAL_TABLET | Freq: Four times a day (QID) | ORAL | Status: DC | PRN
  Administered 2018-11-05 – 2018-11-06 (×4): 500 mg via ORAL
  Filled 2018-11-05 (×4): qty 1

## 2018-11-05 MED ORDER — PROPOFOL 10 MG/ML IV BOLUS
INTRAVENOUS | Status: AC
  Filled 2018-11-05: qty 20

## 2018-11-05 MED ORDER — TRANEXAMIC ACID-NACL 1000-0.7 MG/100ML-% IV SOLN
1000.0000 mg | Freq: Once | INTRAVENOUS | Status: AC
  Administered 2018-11-05: 1000 mg via INTRAVENOUS
  Filled 2018-11-05: qty 100

## 2018-11-05 MED ORDER — TRANEXAMIC ACID-NACL 1000-0.7 MG/100ML-% IV SOLN
1000.0000 mg | INTRAVENOUS | Status: AC
  Administered 2018-11-05: 1000 mg via INTRAVENOUS
  Filled 2018-11-05: qty 100

## 2018-11-05 MED ORDER — METHOCARBAMOL 500 MG IVPB - SIMPLE MED
500.0000 mg | Freq: Four times a day (QID) | INTRAVENOUS | Status: DC | PRN
  Administered 2018-11-05: 500 mg via INTRAVENOUS
  Filled 2018-11-05: qty 50

## 2018-11-05 MED ORDER — SODIUM CHLORIDE (PF) 0.9 % IJ SOLN
INTRAMUSCULAR | Status: AC
  Filled 2018-11-05: qty 10

## 2018-11-05 MED ORDER — CHLORHEXIDINE GLUCONATE 4 % EX LIQD: 60.0000 mL | Freq: Once | CUTANEOUS | Status: DC

## 2018-11-05 MED ORDER — ONDANSETRON HCL 4 MG/2ML IJ SOLN
INTRAMUSCULAR | Status: DC | PRN
  Administered 2018-11-05: 4 mg via INTRAVENOUS

## 2018-11-05 MED ORDER — ONDANSETRON HCL 4 MG/2ML IJ SOLN
INTRAMUSCULAR | Status: AC
  Filled 2018-11-05: qty 2

## 2018-11-05 MED ORDER — BUPIVACAINE-EPINEPHRINE (PF) 0.5% -1:200000 IJ SOLN
INTRAMUSCULAR | Status: DC | PRN
  Administered 2018-11-05: 30 mL via PERINEURAL

## 2018-11-05 MED ORDER — MIDAZOLAM HCL 2 MG/2ML IJ SOLN
2.0000 mg | Freq: Once | INTRAMUSCULAR | Status: AC
  Administered 2018-11-05 (×2): 1 mg via INTRAVENOUS
  Filled 2018-11-05: qty 2

## 2018-11-05 MED ORDER — PHENYLEPHRINE 40 MCG/ML (10ML) SYRINGE FOR IV PUSH (FOR BLOOD PRESSURE SUPPORT)
PREFILLED_SYRINGE | INTRAVENOUS | Status: DC | PRN
  Administered 2018-11-05 (×5): 80 ug via INTRAVENOUS

## 2018-11-05 MED ORDER — LACTATED RINGERS IV SOLN
INTRAVENOUS | Status: DC
  Administered 2018-11-05 (×2): via INTRAVENOUS

## 2018-11-05 MED ORDER — OXYCODONE HCL 5 MG PO TABS
5.0000 mg | ORAL_TABLET | ORAL | Status: DC | PRN
  Administered 2018-11-05: 5 mg via ORAL
  Administered 2018-11-05 – 2018-11-06 (×5): 10 mg via ORAL
  Filled 2018-11-05 (×5): qty 2
  Filled 2018-11-05: qty 1

## 2018-11-05 MED ORDER — ASPIRIN EC 325 MG PO TBEC
325.0000 mg | DELAYED_RELEASE_TABLET | Freq: Two times a day (BID) | ORAL | Status: DC
  Administered 2018-11-06: 325 mg via ORAL
  Filled 2018-11-05: qty 1

## 2018-11-05 MED ORDER — GABAPENTIN 300 MG PO CAPS
300.0000 mg | ORAL_CAPSULE | Freq: Three times a day (TID) | ORAL | Status: DC
  Administered 2018-11-05 – 2018-11-06 (×3): 300 mg via ORAL
  Filled 2018-11-05 (×3): qty 1

## 2018-11-05 MED ORDER — CEFAZOLIN SODIUM-DEXTROSE 2-4 GM/100ML-% IV SOLN
2.0000 g | INTRAVENOUS | Status: AC
  Administered 2018-11-05: 2 g via INTRAVENOUS
  Filled 2018-11-05: qty 100

## 2018-11-05 MED ORDER — METOCLOPRAMIDE HCL 5 MG/ML IJ SOLN: 10.0000 mg | Freq: Once | INTRAMUSCULAR | Status: DC | PRN

## 2018-11-05 MED ORDER — MORPHINE SULFATE (PF) 2 MG/ML IV SOLN: 1.0000 mg | INTRAVENOUS | Status: DC | PRN

## 2018-11-05 MED ORDER — BUPROPION HCL ER (XL) 150 MG PO TB24
150.0000 mg | ORAL_TABLET | Freq: Every day | ORAL | Status: DC
  Administered 2018-11-06: 150 mg via ORAL
  Filled 2018-11-05: qty 1

## 2018-11-05 MED ORDER — FENTANYL CITRATE (PF) 100 MCG/2ML IJ SOLN: 25.0000 ug | INTRAMUSCULAR | Status: DC | PRN

## 2018-11-05 MED ORDER — TRAMADOL HCL 50 MG PO TABS: 50.0000 mg | ORAL_TABLET | Freq: Four times a day (QID) | ORAL | Status: DC | PRN

## 2018-11-05 MED ORDER — DEXAMETHASONE SODIUM PHOSPHATE 10 MG/ML IJ SOLN
10.0000 mg | Freq: Once | INTRAMUSCULAR | Status: AC
  Administered 2018-11-06: 10 mg via INTRAVENOUS
  Filled 2018-11-05: qty 1

## 2018-11-05 MED ORDER — LACTATED RINGERS IV SOLN: INTRAVENOUS | Status: DC

## 2018-11-05 MED ORDER — PROPOFOL 10 MG/ML IV BOLUS
INTRAVENOUS | Status: AC
  Filled 2018-11-05: qty 40

## 2018-11-05 MED ORDER — POTASSIUM CHLORIDE CRYS ER 20 MEQ PO TBCR
20.0000 meq | EXTENDED_RELEASE_TABLET | Freq: Every day | ORAL | Status: DC
  Administered 2018-11-05: 20 meq via ORAL
  Filled 2018-11-05: qty 1

## 2018-11-05 MED ORDER — GABAPENTIN 300 MG PO CAPS
300.0000 mg | ORAL_CAPSULE | Freq: Once | ORAL | Status: AC
  Administered 2018-11-05: 300 mg via ORAL
  Filled 2018-11-05: qty 1

## 2018-11-05 MED ORDER — PROPOFOL 500 MG/50ML IV EMUL
INTRAVENOUS | Status: DC | PRN
  Administered 2018-11-05: 100 ug/kg/min via INTRAVENOUS

## 2018-11-05 MED ORDER — DEXAMETHASONE SODIUM PHOSPHATE 10 MG/ML IJ SOLN
10.0000 mg | Freq: Once | INTRAMUSCULAR | Status: AC
  Administered 2018-11-05: 10 mg via INTRAVENOUS

## 2018-11-05 MED ORDER — DIPHENHYDRAMINE HCL 12.5 MG/5ML PO ELIX: 12.5000 mg | ORAL_SOLUTION | ORAL | Status: DC | PRN

## 2018-11-05 MED ORDER — FENTANYL CITRATE (PF) 100 MCG/2ML IJ SOLN
100.0000 ug | Freq: Once | INTRAMUSCULAR | Status: AC
  Administered 2018-11-05 (×2): 50 ug via INTRAVENOUS
  Filled 2018-11-05: qty 2

## 2018-11-05 MED ORDER — DOCUSATE SODIUM 100 MG PO CAPS
100.0000 mg | ORAL_CAPSULE | Freq: Two times a day (BID) | ORAL | Status: DC
  Administered 2018-11-05 – 2018-11-06 (×2): 100 mg via ORAL
  Filled 2018-11-05 (×2): qty 1

## 2018-11-05 MED ORDER — ALPRAZOLAM 1 MG PO TABS
1.0000 mg | ORAL_TABLET | Freq: Every evening | ORAL | Status: DC | PRN
  Administered 2018-11-05: 1 mg via ORAL
  Filled 2018-11-05: qty 1

## 2018-11-05 MED ORDER — METHOCARBAMOL 500 MG IVPB - SIMPLE MED
INTRAVENOUS | Status: AC
  Filled 2018-11-05: qty 50

## 2018-11-05 MED ORDER — TRAZODONE HCL 50 MG PO TABS: 150.0000 mg | ORAL_TABLET | Freq: Every evening | ORAL | Status: DC | PRN

## 2018-11-05 MED ORDER — SODIUM CHLORIDE 0.9 % IV SOLN
INTRAVENOUS | Status: DC
  Administered 2018-11-05 – 2018-11-06 (×3): via INTRAVENOUS

## 2018-11-05 MED ORDER — ONDANSETRON HCL 4 MG/2ML IJ SOLN: 4.0000 mg | Freq: Four times a day (QID) | INTRAMUSCULAR | Status: DC | PRN

## 2018-11-05 MED ORDER — STERILE WATER FOR IRRIGATION IR SOLN
Status: DC | PRN
  Administered 2018-11-05: 2000 mL

## 2018-11-05 MED ORDER — BISACODYL 10 MG RE SUPP: 10.0000 mg | Freq: Every day | RECTAL | Status: DC | PRN

## 2018-11-05 MED ORDER — PHENOL 1.4 % MT LIQD: 1.0000 | OROMUCOSAL | Status: DC | PRN

## 2018-11-05 MED ORDER — PHENYLEPHRINE 40 MCG/ML (10ML) SYRINGE FOR IV PUSH (FOR BLOOD PRESSURE SUPPORT)
PREFILLED_SYRINGE | INTRAVENOUS | Status: AC
  Filled 2018-11-05: qty 10

## 2018-11-05 MED ORDER — METOCLOPRAMIDE HCL 5 MG/ML IJ SOLN: 5.0000 mg | Freq: Three times a day (TID) | INTRAMUSCULAR | Status: DC | PRN

## 2018-11-05 MED ORDER — SODIUM CHLORIDE 0.9 % IR SOLN
Status: DC | PRN
  Administered 2018-11-05: 1000 mL

## 2018-11-05 MED ORDER — METOCLOPRAMIDE HCL 5 MG PO TABS: 5.0000 mg | ORAL_TABLET | Freq: Three times a day (TID) | ORAL | Status: DC | PRN

## 2018-11-05 MED ORDER — SODIUM CHLORIDE (PF) 0.9 % IJ SOLN
INTRAMUSCULAR | Status: DC | PRN
  Administered 2018-11-05: 60 mL

## 2018-11-05 MED ORDER — ACETAMINOPHEN 10 MG/ML IV SOLN
1000.0000 mg | Freq: Four times a day (QID) | INTRAVENOUS | Status: DC
  Administered 2018-11-05: 1000 mg via INTRAVENOUS
  Filled 2018-11-05: qty 100

## 2018-11-05 MED ORDER — FLEET ENEMA 7-19 GM/118ML RE ENEM: 1.0000 | ENEMA | Freq: Once | RECTAL | Status: DC | PRN

## 2018-11-05 MED ORDER — POLYETHYLENE GLYCOL 3350 17 G PO PACK: 17.0000 g | PACK | Freq: Every day | ORAL | Status: DC | PRN

## 2018-11-05 MED ORDER — CEFAZOLIN SODIUM-DEXTROSE 2-4 GM/100ML-% IV SOLN
2.0000 g | Freq: Four times a day (QID) | INTRAVENOUS | Status: AC
  Administered 2018-11-05 (×2): 2 g via INTRAVENOUS
  Filled 2018-11-05 (×2): qty 100

## 2018-11-05 MED ORDER — BUPIVACAINE IN DEXTROSE 0.75-8.25 % IT SOLN
INTRATHECAL | Status: DC | PRN
  Administered 2018-11-05: 1.6 mL via INTRATHECAL

## 2018-11-05 MED ORDER — SODIUM CHLORIDE (PF) 0.9 % IJ SOLN
INTRAMUSCULAR | Status: AC
  Filled 2018-11-05: qty 50

## 2018-11-05 MED ORDER — BUPIVACAINE LIPOSOME 1.3 % IJ SUSP
INTRAMUSCULAR | Status: DC | PRN
  Administered 2018-11-05: 20 mL

## 2018-11-05 SURGICAL SUPPLY — 67 items
ATTUNE MED DOME PAT 38 KNEE (Knees) ×1 IMPLANT
ATTUNE MED DOME PAT 38MM KNEE (Knees) ×1 IMPLANT
ATTUNE PS FEM RT SZ 6 CEM KNEE (Femur) ×2 IMPLANT
ATTUNE PSRP INSR SZ6 10 KNEE (Insert) ×1 IMPLANT
ATTUNE PSRP INSR SZ6 10MM KNEE (Insert) ×1 IMPLANT
BAG SPEC THK2 15X12 ZIP CLS (MISCELLANEOUS) ×1
BAG ZIPLOCK 12X15 (MISCELLANEOUS) ×3 IMPLANT
BANDAGE ACE 6X5 VEL STRL LF (GAUZE/BANDAGES/DRESSINGS) ×3 IMPLANT
BANDAGE ELASTIC 6 VELCRO ST LF (GAUZE/BANDAGES/DRESSINGS) ×2 IMPLANT
BASE TIBIAL ROT PLAT SZ 5 KNEE (Knees) IMPLANT
BLADE SAG 18X100X1.27 (BLADE) ×3 IMPLANT
BLADE SAW SGTL 11.0X1.19X90.0M (BLADE) ×3 IMPLANT
BLADE SURG SZ10 CARB STEEL (BLADE) ×6 IMPLANT
BOWL SMART MIX CTS (DISPOSABLE) ×3 IMPLANT
BSPLAT TIB 5 CMNT ROT PLAT STR (Knees) ×1 IMPLANT
CEMENT HV SMART SET (Cement) ×6 IMPLANT
CLOSURE STERI-STRIP 1/2X4 (GAUZE/BANDAGES/DRESSINGS) ×1
CLOSURE WOUND 1/2 X4 (GAUZE/BANDAGES/DRESSINGS) ×2
CLSR STERI-STRIP ANTIMIC 1/2X4 (GAUZE/BANDAGES/DRESSINGS) ×1 IMPLANT
COVER SURGICAL LIGHT HANDLE (MISCELLANEOUS) ×3 IMPLANT
COVER WAND RF STERILE (DRAPES) IMPLANT
CUFF TOURN SGL QUICK 34 (TOURNIQUET CUFF) ×3
CUFF TRNQT CYL 34X4X40X1 (TOURNIQUET CUFF) ×1 IMPLANT
DECANTER SPIKE VIAL GLASS SM (MISCELLANEOUS) ×3 IMPLANT
DRAPE U-SHAPE 47X51 STRL (DRAPES) ×3 IMPLANT
DRSG ADAPTIC 3X8 NADH LF (GAUZE/BANDAGES/DRESSINGS) ×3 IMPLANT
DRSG PAD ABDOMINAL 8X10 ST (GAUZE/BANDAGES/DRESSINGS) ×3 IMPLANT
DURAPREP 26ML APPLICATOR (WOUND CARE) ×3 IMPLANT
ELECT REM PT RETURN 15FT ADLT (MISCELLANEOUS) ×3 IMPLANT
EVACUATOR 1/8 PVC DRAIN (DRAIN) ×3 IMPLANT
GAUZE SPONGE 4X4 12PLY STRL (GAUZE/BANDAGES/DRESSINGS) ×3 IMPLANT
GLOVE BIO SURGEON STRL SZ7 (GLOVE) ×1 IMPLANT
GLOVE BIO SURGEON STRL SZ8 (GLOVE) ×3 IMPLANT
GLOVE BIOGEL PI IND STRL 6.5 (GLOVE) ×1 IMPLANT
GLOVE BIOGEL PI IND STRL 7.0 (GLOVE) ×1 IMPLANT
GLOVE BIOGEL PI IND STRL 8 (GLOVE) ×1 IMPLANT
GLOVE BIOGEL PI INDICATOR 6.5 (GLOVE) ×2
GLOVE BIOGEL PI INDICATOR 7.0 (GLOVE)
GLOVE BIOGEL PI INDICATOR 8 (GLOVE) ×2
GLOVE SURG SS PI 6.5 STRL IVOR (GLOVE) ×3 IMPLANT
GOWN STRL REUS W/TWL LRG LVL3 (GOWN DISPOSABLE) ×6 IMPLANT
GOWN STRL REUS W/TWL XL LVL3 (GOWN DISPOSABLE) ×3 IMPLANT
HANDPIECE INTERPULSE COAX TIP (DISPOSABLE) ×3
HOLDER FOLEY CATH W/STRAP (MISCELLANEOUS) IMPLANT
IMMOBILIZER KNEE 20 (SOFTGOODS) ×3
IMMOBILIZER KNEE 20 THIGH 36 (SOFTGOODS) ×1 IMPLANT
MANIFOLD NEPTUNE II (INSTRUMENTS) ×3 IMPLANT
NS IRRIG 1000ML POUR BTL (IV SOLUTION) ×3 IMPLANT
PACK TOTAL KNEE CUSTOM (KITS) ×3 IMPLANT
PAD ABD 8X10 STRL (GAUZE/BANDAGES/DRESSINGS) ×2 IMPLANT
PADDING CAST COTTON 6X4 STRL (CAST SUPPLIES) ×5 IMPLANT
PIN STEINMAN FIXATION KNEE (PIN) ×2 IMPLANT
PIN THREADED HEADED SIGMA (PIN) ×2 IMPLANT
PROTECTOR NERVE ULNAR (MISCELLANEOUS) ×3 IMPLANT
SET HNDPC FAN SPRY TIP SCT (DISPOSABLE) ×1 IMPLANT
STRIP CLOSURE SKIN 1/2X4 (GAUZE/BANDAGES/DRESSINGS) ×4 IMPLANT
SUT MNCRL AB 4-0 PS2 18 (SUTURE) ×3 IMPLANT
SUT STRATAFIX 0 PDS 27 VIOLET (SUTURE) ×3
SUT VIC AB 2-0 CT1 27 (SUTURE) ×9
SUT VIC AB 2-0 CT1 TAPERPNT 27 (SUTURE) ×3 IMPLANT
SUTURE STRATFX 0 PDS 27 VIOLET (SUTURE) ×1 IMPLANT
TIBIAL BASE ROT PLAT SZ 5 KNEE (Knees) ×3 IMPLANT
TRAY FOLEY MTR SLVR 14FR STAT (SET/KITS/TRAYS/PACK) ×2 IMPLANT
TRAY FOLEY MTR SLVR 16FR STAT (SET/KITS/TRAYS/PACK) ×1 IMPLANT
WATER STERILE IRR 1000ML POUR (IV SOLUTION) ×6 IMPLANT
WRAP KNEE MAXI GEL POST OP (GAUZE/BANDAGES/DRESSINGS) ×3 IMPLANT
YANKAUER SUCT BULB TIP 10FT TU (MISCELLANEOUS) ×3 IMPLANT

## 2018-11-05 NOTE — Interval H&P Note (Signed)
History and Physical Interval Note:  11/05/2018 6:31 AM  Anne [name]  has presented today for surgery, with the diagnosis of right knee osteoarthritis  The various methods of treatment have been discussed with the patient and family. After consideration of risks, benefits and other options for treatment, the patient has consented to  Procedure(s) with comments: TOTAL KNEE ARTHROPLASTY (Right) - 16min as a surgical intervention .  The patient's history has been reviewed, patient examined, no change in status, stable for surgery.  I have reviewed the patient's chart and labs.  Questions were answered to the patient's satisfaction.     Pilar Plate [place]

## 2018-11-05 NOTE — Anesthesia Procedure Notes (Signed)
Spinal  Patient location during procedure: OR Start time: 11/05/2018 8:14 AM End time: 11/05/2018 8:16 AM Staffing Resident/CRNA: Lind Covert, CRNA Performed: resident/CRNA  Preanesthetic Checklist Completed: patient identified, site marked, surgical consent, pre-op evaluation, timeout performed, IV checked, risks and benefits discussed and monitors and equipment checked Spinal Block Patient position: sitting Prep: DuraPrep Patient monitoring: heart rate, cardiac monitor, continuous pulse ox and blood pressure Approach: midline Location: L3-4 Injection technique: single-shot Needle Needle type: Pencan  Needle gauge: 24 G Needle length: 10 cm Needle insertion depth: 7 cm Assessment Sensory level: T6 Additional Notes Timeout performed. SAB date checked. SAB without difficulty

## 2018-11-05 NOTE — Progress Notes (Signed)
Dr Royce Macadamia at bedside. States pt may go to rm at this time.

## 2018-11-05 NOTE — Evaluation (Signed)
Physical Therapy Evaluation Patient Details Name: Anne [name] MRN: [id number] DOB: [birth year] Today's Date: 11/05/2018   History of Present Illness  63 yo female s/p R TKR on 11/05/18. PMH includes L TKR 2018, OA, HTN, anxiety, cataracts, R RTC repair.   Clinical Impression   Pt presents with R knee pain, decreased R knee ROM, difficulty performing bed mobility, increased time and effort to perform mobility tasks, and decreased tolerance for ambulation due to pain. Pt to benefit from acute PT to address deficits. Pt ambulated 90 ft with RW with min guard assist, limited by pain. Pt educated on ankle pumps (20/hour) to perform this afternoon/evening to increase circulation, to pt's tolerance and limited by pain. PT to progress mobility as tolerated, and will continue to follow acutely.         Follow Up Recommendations Follow surgeon's recommendation for DC plan and follow-up therapies;Supervision for mobility/OOB    Equipment Recommendations  None recommended by PT    Recommendations for Other Services       Precautions / Restrictions Precautions Precautions: Fall Required Braces or Orthoses: Knee Immobilizer - Right Knee Immobilizer - Right: On when out of bed or walking;Discontinue once straight leg raise with < 10 degree lag Restrictions Weight Bearing Restrictions: No Other Position/Activity Restrictions: WBAT       Mobility  Bed Mobility Overal bed mobility: Needs Assistance Bed Mobility: Supine to Sit     Supine to sit: Min assist;HOB elevated     General bed mobility comments: Min assist for RLE lift assist and translation to EOB. Increased time and effort.   Transfers Overall transfer level: Needs assistance Equipment used: Rolling walker (2 wheeled) Transfers: Sit to/from Stand Sit to Stand: Min guard;From elevated surface         General transfer comment: Min guard for safety. Verbal cuing for pushing with UEs from bed as opposed to pulling on RW. Once  standing, pt weight shifting L and R without difficulty.   Ambulation/Gait Ambulation/Gait assistance: Min guard;+2 safety/equipment(pt's friend following with recliner ) Gait Distance (Feet): 90 Feet Assistive device: Rolling walker (2 wheeled) Gait Pattern/deviations: Step-to pattern;Decreased stance time - right;Decreased weight shift to right;Antalgic Gait velocity: decr    General Gait Details: Min guard for safety. Verbal cuing for sequencing, pt with appropriate placement in RW, turning, and posture with ambulation. Pt with increasing antalgic gait with increased ambulation distance.   Stairs            Wheelchair Mobility    Modified Rankin (Stroke Patients Only)       Balance Overall balance assessment: Mild deficits observed, not formally tested                                           Pertinent Vitals/Pain Pain Assessment: 0-10 Pain Score: 4  Pain Location: R knee  Pain Descriptors / Indicators: Sore Pain Intervention(s): Limited activity within patient's tolerance;Repositioned;Ice applied;Monitored during session;Premedicated before session    Home Living Family/patient expects to be discharged to:: Private residence Living Arrangements: Alone(d/cing to mother's house, below information applies to mother's residence) Available Help at Discharge: Family;Available 24 hours/day;Friend(s)(mother, sister, friends ) Type of Home: House Home Access: Stairs to enter;Ramped entrance   Entrance Stairs-Number of Steps: 3 Home Layout: One level Home Equipment: Walker - 2 wheels;Cane - single point;Toilet riser;Tub bench      Prior Function Level of  Independence: Independent               Hand Dominance   Dominant Hand: Right    Extremity/Trunk Assessment   Upper Extremity Assessment Upper Extremity Assessment: Overall WFL for tasks assessed    Lower Extremity Assessment Lower Extremity Assessment: Overall WFL for tasks  assessed;RLE deficits/detail RLE Deficits / Details: suspected post-surgical weakness; able to perform ankle pumps, quad set, SLR without quad lag or lift assist  RLE Sensation: WNL(Pt reporting R great toe is tingly )    Cervical / Trunk Assessment Cervical / Trunk Assessment: Normal  Communication   Communication: No difficulties  Cognition Arousal/Alertness: Awake/alert Behavior During Therapy: WFL for tasks assessed/performed Overall Cognitive Status: Within Functional Limits for tasks assessed                                        General Comments      Exercises     Assessment/Plan    PT Assessment Patient needs continued PT services  PT Problem List Decreased strength;Pain;Decreased range of motion;Decreased activity tolerance;Decreased knowledge of use of DME;Decreased mobility;Decreased balance;Decreased safety awareness       PT Treatment Interventions DME instruction;Therapeutic activities;Gait training;Therapeutic exercise;Patient/family education;Balance training;Stair training;Functional mobility training    PT Goals (Current goals can be found in the Care Plan section)  Acute Rehab PT Goals Patient Stated Goal: go home  PT Goal Formulation: With patient Time For Goal Achievement: 11/12/18 Potential to Achieve Goals: Good    Frequency 7X/week   Barriers to discharge        Co-evaluation               AM-PAC PT "6 Clicks" Mobility  Outcome Measure Help needed turning from your back to your side while in a flat bed without using bedrails?: A Little Help needed moving from lying on your back to sitting on the side of a flat bed without using bedrails?: A Little Help needed moving to and from a bed to a chair (including a wheelchair)?: A Little Help needed standing up from a chair using your arms (e.g., wheelchair or bedside chair)?: A Little Help needed to walk in hospital room?: A Little Help needed climbing 3-5 steps with a  railing? : A Little 6 Click Score: 18    End of Session Equipment Utilized During Treatment: Gait belt Activity Tolerance: Patient limited by pain;Patient tolerated treatment well Patient left: in bed;with bed alarm set;with call bell/phone within reach;with family/visitor present;with SCD's reapplied Nurse Communication: Mobility status PT Visit Diagnosis: Difficulty in walking, not elsewhere classified (R26.2);Other abnormalities of gait and mobility (R26.89)    Time: 9233-0076 PT Time Calculation (min) (ACUTE ONLY): 35 min   Charges:   PT Evaluation $PT Eval Low Complexity: 1 Low PT Treatments $Gait Training: 8-22 mins       Julien Girt, PT Acute Rehabilitation Services Pager [phone]  Office [phone]   Roxine Caddy D Elonda Husky 11/05/2018, 2:34 PM

## 2018-11-05 NOTE — Progress Notes (Signed)
AssistedDr. Foster with right, ultrasound guided, adductor canal block. Side rails up, monitors on throughout procedure. See vital signs in flow sheet. Tolerated Procedure well.  

## 2018-11-05 NOTE — Op Note (Signed)
OPERATIVE REPORT-TOTAL KNEE ARTHROPLASTY   Pre-operative diagnosis- Osteoarthritis  Right knee(s)  Post-operative diagnosis- Osteoarthritis Right knee(s)  Procedure-  Right  Total Knee Arthroplasty  Surgeon- Dione Plover. Susan Arana, MD  Assistant- Ardeen Jourdain, PA-C   Anesthesia-  Adductor canal block and spinal  EBL-20 mL   Drains Hemovac  Tourniquet time-  Total Tourniquet Time Documented: Thigh (Right) - 39 minutes Total: Thigh (Right) - 39 minutes     Complications- None  Condition-PACU - hemodynamically stable.   Brief Clinical Note  Anne [name] is a 63 y.o. year old female with end stage OA of her right knee with progressively worsening pain and dysfunction. She has constant pain, with activity and at rest and significant functional deficits with difficulties even with ADLs. She has had extensive non-op management including analgesics, injections of cortisone and viscosupplements, and home exercise program, but remains in significant pain with significant dysfunction.Radiographs show bone on bone arthritis medial and patellofemoral. She presents now for right Total Knee Arthroplasty.    Procedure in detail---   The patient is brought into the operating room and positioned supine on the operating table. After successful administration of  Adductor canal block and spinal,   a tourniquet is placed high on the  Right thigh(s) and the lower extremity is prepped and draped in the usual sterile fashion. Time out is performed by the operating team and then the  Right lower extremity is wrapped in Esmarch, knee flexed and the tourniquet inflated to 300 mmHg.       A midline incision is made with a ten blade through the subcutaneous tissue to the level of the extensor mechanism. A fresh blade is used to make a medial parapatellar arthrotomy. Soft tissue over the proximal medial tibia is subperiosteally elevated to the joint line with a knife and into the semimembranosus bursa with a  Cobb elevator. Soft tissue over the proximal lateral tibia is elevated with attention being paid to avoiding the patellar tendon on the tibial tubercle. The patella is everted, knee flexed 90 degrees and the ACL and PCL are removed. Findings are bone on bone medial and patellofemoral with massive global osteophytes.        The drill is used to create a starting hole in the distal femur and the canal is thoroughly irrigated with sterile saline to remove the fatty contents. The 5 degree Right  valgus alignment guide is placed into the femoral canal and the distal femoral cutting block is pinned to remove 9 mm off the distal femur. Resection is made with an oscillating saw.      The tibia is subluxed forward and the menisci are removed. The extramedullary alignment guide is placed referencing proximally at the medial aspect of the tibial tubercle and distally along the second metatarsal axis and tibial crest. The block is pinned to remove 47mm off the more deficient medial  side. Resection is made with an oscillating saw. Size 5is the most appropriate size for the tibia and the proximal tibia is prepared with the modular drill and keel punch for that size.      The femoral sizing guide is placed and size 6 is most appropriate. Rotation is marked off the epicondylar axis and confirmed by creating a rectangular flexion gap at 90 degrees. The size 6 cutting block is pinned in this rotation and the anterior, posterior and chamfer cuts are made with the oscillating saw. The intercondylar block is then placed and that cut is made.  Trial size 5 tibial component, trial size 6 posterior stabilized femur and a 10  mm posterior stabilized rotating platform insert trial is placed. Full extension is achieved with excellent varus/valgus and anterior/posterior balance throughout full range of motion. The patella is everted and thickness measured to be 22  mm. Free hand resection is taken to 12 mm, a 38 template is placed, lug  holes are drilled, trial patella is placed, and it tracks normally. Osteophytes are removed off the posterior femur with the trial in place. All trials are removed and the cut bone surfaces prepared with pulsatile lavage. Cement is mixed and once ready for implantation, the size 5 tibial implant, size  6 posterior stabilized femoral component, and the size 38 patella are cemented in place and the patella is held with the clamp. The trial insert is placed and the knee held in full extension. The Exparel (20 ml mixed with 60 ml saline) is injected into the extensor mechanism, posterior capsule, medial and lateral gutters and subcutaneous tissues.  All extruded cement is removed and once the cement is hard the permanent 10 mm posterior stabilized rotating platform insert is placed into the tibial tray.      The wound is copiously irrigated with saline solution and the extensor mechanism closed over a hemovac drain with #1 V-loc suture. The tourniquet is released for a total tourniquet time of 39  minutes. Flexion against gravity is 140 degrees and the patella tracks normally. Subcutaneous tissue is closed with 2.0 vicryl and subcuticular with running 4.0 Monocryl. The incision is cleaned and dried and steri-strips and a bulky sterile dressing are applied. The limb is placed into a knee immobilizer and the patient is awakened and transported to recovery in stable condition.      Please note that a surgical assistant was a medical necessity for this procedure in order to perform it in a safe and expeditious manner. Surgical assistant was necessary to retract the ligaments and vital neurovascular structures to prevent injury to them and also necessary for proper positioning of the limb to allow for anatomic placement of the prosthesis.   Dione Plover Dhyan Noah, MD    11/05/2018, 9:16 AM

## 2018-11-05 NOTE — Anesthesia Procedure Notes (Signed)
Anesthesia Regional Block: Adductor canal block   Pre-Anesthetic Checklist: ,, timeout performed, Correct Patient, Correct Site, Correct Laterality, Correct Procedure, Correct Position, site marked, Risks and benefits discussed,  Surgical consent,  Pre-op evaluation,  At surgeon's request and post-op pain management  Laterality: Right  Prep: chloraprep       Needles:  Injection technique: Single-shot  Needle Type: Echogenic Stimulator Needle     Needle Length: 9cm  Needle Gauge: 21   Needle insertion depth: 7 cm   Additional Needles:   Procedures:,,,, ultrasound used (permanent image in chart),,,,  Narrative:  Start time: 11/05/2018 7:34 AM End time: 11/05/2018 7:38 AM Injection made incrementally with aspirations every 5 mL.  Performed by: Personally  Anesthesiologist: Josephine Igo, MD  Additional Notes: Timeout performed. Patient sedated. Relevant anatomy ID'd using [place]. Incremental 2-74ml injection of LA with frequent aspiration. Patient tolerated procedure well.        Right Adductor Canal Block

## 2018-11-05 NOTE — Discharge Instructions (Signed)
° °Dr. Frank Aluisio °Total Joint Specialist °[facility] °[address] °[place], [place] [address] °[phone] ° °TOTAL KNEE REPLACEMENT POSTOPERATIVE DIRECTIONS ° °Knee Rehabilitation, Guidelines Following Surgery  °Results after knee surgery are often greatly improved when you follow the exercise, range of motion and muscle strengthening exercises prescribed by your doctor. Safety measures are also important to protect the knee from further injury. Any time any of these exercises cause you to have increased pain or swelling in your knee joint, decrease the amount until you are comfortable again and slowly increase them. If you have problems or questions, call your caregiver or physical therapist for advice.  ° °HOME CARE INSTRUCTIONS  °• Remove items at home which could result in a fall. This includes throw rugs or furniture in walking pathways.  °· ICE to the affected knee every three hours for 30 minutes at a time and then as needed for pain and swelling.  Continue to use ice on the knee for pain and swelling from surgery. You may notice swelling that will progress down to the foot and ankle.  This is normal after surgery.  Elevate the leg when you are not up walking on it.   °· Continue to use the breathing machine which will help keep your temperature down.  It is common for your temperature to cycle up and down following surgery, especially at night when you are not up moving around and exerting yourself.  The breathing machine keeps your lungs expanded and your temperature down. °· Do not place pillow under knee, focus on keeping the knee straight while resting ° °DIET °You may resume your previous home diet once your are discharged from the hospital. ° °DRESSING / WOUND CARE / SHOWERING °You may shower 3 days after surgery, but keep the wounds dry during showering.  You may use an occlusive plastic wrap (Press'n Seal for example), NO SOAKING/SUBMERGING IN THE BATHTUB.  If the bandage  gets wet, change with a clean dry gauze.  If the incision gets wet, pat the wound dry with a clean towel. °You may start showering once you are discharged home but do not submerge the incision under water. Just pat the incision dry and apply a dry gauze dressing on daily. °Change the surgical dressing daily and reapply a dry dressing each time. ° °ACTIVITY °Walk with your walker as instructed. °Use walker as long as suggested by your caregivers. °Avoid periods of inactivity such as sitting longer than an hour when not asleep. This helps prevent blood clots.  °You may resume a sexual relationship in one month or when given the OK by your doctor.  °You may return to work once you are cleared by your doctor.  °Do not drive a car for 6 weeks or until released by you surgeon.  °Do not drive while taking narcotics. ° °WEIGHT BEARING °Weight bearing as tolerated with assist device (walker, cane, etc) as directed, use it as long as suggested by your surgeon or therapist, typically at least 4-6 weeks. ° °POSTOPERATIVE CONSTIPATION PROTOCOL °Constipation - defined medically as fewer than three stools per week and severe constipation as less than one stool per week. ° °One of the most common issues patients have following surgery is constipation.  Even if you have a regular bowel pattern at home, your normal regimen is likely to be disrupted due to multiple reasons following surgery.  Combination of anesthesia, postoperative narcotics, change in appetite and fluid intake all can affect your bowels.    In order to avoid complications following surgery, here are some recommendations in order to help you during your recovery period. ° °Colace (docusate) - Pick up an over-the-counter form of Colace or another stool softener and take twice a day as long as you are requiring postoperative pain medications.  Take with a full glass of water daily.  If you experience loose stools or diarrhea, hold the colace until you stool forms back  up.  If your symptoms do not get better within 1 week or if they get worse, check with your doctor. ° °Dulcolax (bisacodyl) - Pick up over-the-counter and take as directed by the product packaging as needed to assist with the movement of your bowels.  Take with a full glass of water.  Use this product as needed if not relieved by Colace only.  ° °MiraLax (polyethylene glycol) - Pick up over-the-counter to have on hand.  MiraLax is a solution that will increase the amount of water in your bowels to assist with bowel movements.  Take as directed and can mix with a glass of water, juice, soda, coffee, or tea.  Take if you go more than two days without a movement. °Do not use MiraLax more than once per day. Call your doctor if you are still constipated or irregular after using this medication for 7 days in a row. ° °If you continue to have problems with postoperative constipation, please contact the office for further assistance and recommendations.  If you experience "the worst abdominal pain ever" or develop nausea or vomiting, please contact the office immediatly for further recommendations for treatment. ° °ITCHING ° If you experience itching with your medications, try taking only a single pain pill, or even half a pain pill at a time.  You can also use Benadryl over the counter for itching or also to help with sleep.  ° °TED HOSE STOCKINGS °Wear the elastic stockings on both legs for three weeks following surgery during the day but you may remove then at night for sleeping. ° °MEDICATIONS °See your medication summary on the “After Visit Summary” that the nursing staff will review with you prior to discharge.  You may have some home medications which will be placed on hold until you complete the course of blood thinner medication.  It is important for you to complete the blood thinner medication as prescribed by your surgeon.  Continue your approved medications as instructed at time of discharge. ° °PRECAUTIONS °If  you experience chest pain or shortness of breath - call 911 immediately for transfer to the hospital emergency department.  °If you develop a fever greater that 101 F, purulent drainage from wound, increased redness or drainage from wound, foul odor from the wound/dressing, or calf pain - CONTACT YOUR SURGEON.   °                                                °FOLLOW-UP APPOINTMENTS °Make sure you keep all of your appointments after your operation with your surgeon and caregivers. You should call the office at the above phone number and make an appointment for approximately two weeks after the date of your surgery or on the date instructed by your surgeon outlined in the "After Visit Summary". ° ° °RANGE OF MOTION AND STRENGTHENING EXERCISES  °Rehabilitation of the knee is important following a knee injury or   an operation. After just a few days of immobilization, the muscles of the thigh which control the knee become weakened and shrink (atrophy). Knee exercises are designed to build up the tone and strength of the thigh muscles and to improve knee motion. Often times heat used for twenty to thirty minutes before working out will loosen up your tissues and help with improving the range of motion but do not use heat for the first two weeks following surgery. These exercises can be done on a training (exercise) mat, on the floor, on a table or on a bed. Use what ever works the best and is most comfortable for you Knee exercises include:  °• Leg Lifts - While your knee is still immobilized in a splint or cast, you can do straight leg raises. Lift the leg to 60 degrees, hold for 3 sec, and slowly lower the leg. Repeat 10-20 times 2-3 times daily. Perform this exercise against resistance later as your knee gets better.  °• Quad and Hamstring Sets - Tighten up the muscle on the front of the thigh (Quad) and hold for 5-10 sec. Repeat this 10-20 times hourly. Hamstring sets are done by pushing the foot backward against an  object and holding for 5-10 sec. Repeat as with quad sets.  °· Leg Slides: Lying on your back, slowly slide your foot toward your buttocks, bending your knee up off the floor (only go as far as is comfortable). Then slowly slide your foot back down until your leg is flat on the floor again. °· Angel Wings: Lying on your back spread your legs to the side as far apart as you can without causing discomfort.  °A rehabilitation program following serious knee injuries can speed recovery and prevent re-injury in the future due to weakened muscles. Contact your doctor or a physical therapist for more information on knee rehabilitation.  ° °IF YOU ARE TRANSFERRED TO A SKILLED REHAB FACILITY °If the patient is transferred to a skilled rehab facility following release from the hospital, a list of the current medications will be sent to the facility for the patient to continue.  When discharged from the skilled rehab facility, please have the facility set up the patient's Home Health Physical Therapy prior to being released. Also, the skilled facility will be responsible for providing the patient with their medications at time of release from the facility to include their pain medication, the muscle relaxants, and their blood thinner medication. If the patient is still at the rehab facility at time of the two week follow up appointment, the skilled rehab facility will also need to assist the patient in arranging follow up appointment in our office and any transportation needs. ° °MAKE SURE YOU:  °• Understand these instructions.  °• Get help right away if you are not doing well or get worse.  ° ° °Pick up stool softner and laxative for home use following surgery while on pain medications. °Do not submerge incision under water. °Please use good hand washing techniques while changing dressing each day. °May shower starting three days after surgery. °Please use a clean towel to pat the incision dry following showers. °Continue to  use ice for pain and swelling after surgery. °Do not use any lotions or creams on the incision until instructed by your surgeon. ° °

## 2018-11-05 NOTE — Transfer of Care (Signed)
Immediate Anesthesia Transfer of Care Note  Patient: Anne [name]  Procedure(s) Performed: TOTAL KNEE ARTHROPLASTY (Right )  Patient Location: PACU  Anesthesia Type:Spinal and MAC combined with regional for post-op pain  Level of Consciousness: awake, alert , oriented and patient cooperative  Airway & Oxygen Therapy: Patient Spontanous Breathing and Patient connected to face mask oxygen  Post-op Assessment: Report given to RN and Post -op Vital signs reviewed and stable  Post vital signs: Reviewed and stable  Last Vitals:  Vitals Value Taken Time  BP    Temp    Pulse 78 11/05/2018  9:35 AM  Resp 18 11/05/2018  9:35 AM  SpO2 100 % 11/05/2018  9:35 AM  Vitals shown include unvalidated device data.  Last Pain:  Vitals:   11/05/18 0746  TempSrc:   PainSc: 0-No pain      Patients Stated Pain Goal: 1 (19/14/78 2956)  Complications: No apparent anesthesia complications

## 2018-11-05 NOTE — Anesthesia Postprocedure Evaluation (Addendum)
Anesthesia Post Note  Patient: Anne [name]  Procedure(s) Performed: TOTAL KNEE ARTHROPLASTY (Right )     Patient location during evaluation: PACU Anesthesia Type: Spinal Level of consciousness: oriented and awake and alert Pain management: pain level controlled Vital Signs Assessment: post-procedure vital signs reviewed and stable Respiratory status: spontaneous breathing, respiratory function stable, nonlabored ventilation and patient connected to nasal cannula oxygen Cardiovascular status: blood pressure returned to baseline and stable Postop Assessment: no headache, no backache, no apparent nausea or vomiting, patient able to bend at knees and spinal receding Anesthetic complications: no    Last Vitals:  Vitals:   11/05/18 1000 11/05/18 1015  BP: 109/62 115/65  Pulse: 64 61  Resp: 20 14  Temp:    SpO2: 100% 100%    Last Pain:  Vitals:   11/05/18 1000  TempSrc:   PainSc: 0-No pain                 Anistyn Graddy A.

## 2018-11-06 ENCOUNTER — Encounter (HOSPITAL_COMMUNITY): Payer: Self-pay | Admitting: Orthopedic Surgery

## 2018-11-06 LAB — CBC
HCT: 33.4 % — ABNORMAL LOW (ref 36.0–46.0)
Hemoglobin: 10.6 g/dL — ABNORMAL LOW (ref 12.0–15.0)
MCH: 29.9 pg (ref 26.0–34.0)
MCHC: 31.7 g/dL (ref 30.0–36.0)
MCV: 94.1 fL (ref 80.0–100.0)
NRBC: 0 % (ref 0.0–0.2)
Platelets: 180 10*3/uL (ref 150–400)
RBC: 3.55 MIL/uL — ABNORMAL LOW (ref 3.87–5.11)
RDW: 13.5 % (ref 11.5–15.5)
WBC: 13.5 10*3/uL — ABNORMAL HIGH (ref 4.0–10.5)

## 2018-11-06 LAB — BASIC METABOLIC PANEL
Anion gap: 5 (ref 5–15)
BUN: 13 mg/dL (ref 8–23)
CALCIUM: 8.1 mg/dL — AB (ref 8.9–10.3)
CO2: 25 mmol/L (ref 22–32)
Chloride: 111 mmol/L (ref 98–111)
Creatinine, Ser: 0.66 mg/dL (ref 0.44–1.00)
GFR calc Af Amer: >60 mL/min (ref >60)
GFR calc non Af Amer: >60 mL/min (ref >60)
Glucose, Bld: 166 mg/dL — ABNORMAL HIGH (ref 70–99)
Potassium: 3.4 mmol/L — ABNORMAL LOW (ref 3.5–5.1)
Sodium: 141 mmol/L (ref 135–145)

## 2018-11-06 MED ORDER — METHOCARBAMOL 500 MG PO TABS: 500.0000 mg | ORAL_TABLET | Freq: Four times a day (QID) | ORAL | 0 refills | Status: DC | PRN

## 2018-11-06 MED ORDER — SODIUM CHLORIDE 0.9 % IV BOLUS
250.0000 mL | Freq: Once | INTRAVENOUS | Status: AC
  Administered 2018-11-06: 250 mL via INTRAVENOUS

## 2018-11-06 MED ORDER — TRAMADOL HCL 50 MG PO TABS: 50.0000 mg | ORAL_TABLET | Freq: Four times a day (QID) | ORAL | 0 refills | Status: DC | PRN

## 2018-11-06 MED ORDER — ASPIRIN 325 MG PO TBEC: 325.0000 mg | DELAYED_RELEASE_TABLET | Freq: Two times a day (BID) | ORAL | 0 refills | Status: AC

## 2018-11-06 MED ORDER — TRAZODONE HCL 50 MG PO TABS: 150.0000 mg | ORAL_TABLET | Freq: Every evening | ORAL | Status: DC | PRN

## 2018-11-06 MED ORDER — OXYCODONE HCL 5 MG PO TABS: 5.0000 mg | ORAL_TABLET | Freq: Four times a day (QID) | ORAL | 0 refills | Status: DC | PRN

## 2018-11-06 MED ORDER — GABAPENTIN 300 MG PO CAPS: 300.0000 mg | ORAL_CAPSULE | Freq: Three times a day (TID) | ORAL | 0 refills | Status: DC

## 2018-11-06 MED ORDER — POTASSIUM CHLORIDE CRYS ER 20 MEQ PO TBCR
40.0000 meq | EXTENDED_RELEASE_TABLET | Freq: Once | ORAL | Status: AC
  Administered 2018-11-06: 40 meq via ORAL
  Filled 2018-11-06: qty 2

## 2018-11-06 NOTE — Progress Notes (Signed)
   Subjective: 1 Day Post-Op Procedure(s) (LRB): TOTAL KNEE ARTHROPLASTY (Right) Patient reports pain as mild.   Patient seen in rounds by Dr. Wynelle Link. Patient is well, and has had no acute complaints or problems. No issues overnight. Denies chest pain, SOB, or calf pain. Foley catheter removed this AM. We will continue therapy today.   Objective: Vital signs in last 24 hours: Temp:  [97.2 F (36.2 C)-98.3 F (36.8 C)] 98.1 F (36.7 C) (02/04 0553) Pulse Rate:  [54-92] 67 (02/04 0553) Resp:  [12-20] 19 (02/04 0553) BP: (99-152)/(50-95) 101/56 (02/04 0553) SpO2:  [96 %-100 %] 97 % (02/04 0553) Weight:  [104.8 kg] 104.8 kg (02/03 1148)  Intake/Output from previous day:  Intake/Output Summary (Last 24 hours) at 11/06/2018 0737 Last data filed at 11/06/2018 0600 Gross per 24 hour  Intake 4609.9 ml  Output 5070 ml  Net -460.1 ml    Labs: Recent Labs    11/06/18 0516  HGB 10.6*   Recent Labs    11/06/18 0516  WBC 13.5*  RBC 3.55*  HCT 33.4*  PLT 180   Recent Labs    11/06/18 0516  NA 141  K 3.4*  CL 111  CO2 25  BUN 13  CREATININE 0.66  GLUCOSE 166*  CALCIUM 8.1*   Exam: General - Patient is Alert and Oriented Extremity - Neurologically intact Neurovascular intact Sensation intact distally Dorsiflexion/Plantar flexion intact Dressing - dressing C/D/I Motor Function - intact, moving foot and toes well on exam.   Past Medical History:  Diagnosis Date  . Anxiety   . Arthritis   . Cataract    hx of  . Chronic low back pain   . Deviated septum   . Dyslipidemia   . GERD (gastroesophageal reflux disease)    occ  . History of hiatal hernia   . Hypertension   . Legally blind    Right eye  . Major depression   . Migraines   . Morbid obesity ([place])   . Pneumonia     Assessment/Plan: 1 Day Post-Op Procedure(s) (LRB): TOTAL KNEE ARTHROPLASTY (Right) Active Problems:   OA (osteoarthritis) of knee  Estimated body mass index is 37.28 kg/m as  calculated from the following:   Height as of this encounter: 5\' 6"  (1.676 m).   Weight as of this encounter: 104.8 kg. Advance diet Up with therapy D/C IV fluids  Anticipated LOS equal to or greater than 2 midnights due to - Age 68 and older with one or more of the following:  - Obesity  - Expected need for hospital services (PT, OT, Nursing) required for safe  discharge  - Anticipated need for postoperative skilled nursing care or inpatient rehab  - Active co-morbidities: None OR   - Unanticipated findings during/Post Surgery: None  - Patient is a high risk of re-admission due to: None    DVT Prophylaxis - Aspirin Weight bearing as tolerated. D/C O2 and pulse ox and try on room air. Hemovac pulled without difficulty, will continue therapy today.  Potassium at 3.4 this AM, one dose of 40 mEq KCl ordered. BP soft overnight, 250 mL bolus ordered.  Plan is to go Home after hospital stay. Possible discharge this afternoon if progresses with therapy and meeting her goals. Scheduled for outpatient therapy at [facility]. Follow-up in the office in 2 weeks.   Theresa Duty, PA-C Orthopedic Surgery 11/06/2018, 7:37 AM

## 2018-11-06 NOTE — Plan of Care (Signed)
  Problem: Clinical Measurements: Goal: Ability to maintain clinical measurements within normal limits will improve Outcome: Progressing   Problem: Clinical Measurements: Goal: Cardiovascular complication will be avoided Outcome: Progressing   Problem: Elimination: Goal: Will not experience complications related to bowel motility Outcome: Progressing   Problem: Pain Managment: Goal: General experience of comfort will improve Outcome: Progressing   Problem: Skin Integrity: Goal: Risk for impaired skin integrity will decrease Outcome: Progressing

## 2018-11-06 NOTE — Progress Notes (Signed)
Physical Therapy Treatment Patient Details Name: Anne [name] MRN: [id number] DOB: 10/27/1955 Today's Date: 11/06/2018    History of Present Illness 63 yo female s/p R TKR on 11/05/18. PMH includes L TKR 2018, OA, HTN, anxiety, cataracts, R RTC repair.     PT Comments    Progressing well with mobility. Reviewed exercises and gait training. Issued HEP for pt to perform 2x/day until she begins OP PT. All education completed-RN aware.    Follow Up Recommendations  Follow surgeon's recommendation for DC plan and follow-up therapies     Equipment Recommendations  None recommended by PT    Recommendations for Other Services       Precautions / Restrictions Precautions Precautions: Fall Required Braces or Orthoses: Knee Immobilizer - Right Knee Immobilizer - Right: Discontinue once straight leg raise with < 10 degree lag Restrictions Weight Bearing Restrictions: No Other Position/Activity Restrictions: WBAT     Mobility  Bed Mobility Overal bed mobility: Needs Assistance Bed Mobility: Supine to Sit;Sit to Supine     Supine to sit: Min guard Sit to supine: Min assist   General bed mobility comments: Assist for R LE.   Transfers Overall transfer level: Needs assistance Equipment used: Rolling walker (2 wheeled) Transfers: Sit to/from Stand Sit to Stand: Min guard         General transfer comment: close guard for safety. VCs hand placement.   Ambulation/Gait Ambulation/Gait assistance: Min guard Gait Distance (Feet): 120 Feet Assistive device: Rolling walker (2 wheeled) Gait Pattern/deviations: Step-to pattern;Step-through pattern;Decreased stride length     General Gait Details: close guard for safety. Pt denied lightheadedness   Stairs Stairs: (pt has a ramp)           Wheelchair Mobility    Modified Rankin (Stroke Patients Only)       Balance Overall balance assessment: Mild deficits observed, not formally tested                                           Cognition Arousal/Alertness: Awake/alert Behavior During Therapy: WFL for tasks assessed/performed Overall Cognitive Status: Within Functional Limits for tasks assessed                                        Exercises Total Joint Exercises Ankle Circles/Pumps: AROM;Both;10 reps;Supine Quad Sets: AROM;Both;10 reps;Supine Heel Slides: AAROM;10 reps;Supine;Right Hip ABduction/ADduction: 10 reps;Supine;Right;AROM Straight Leg Raises: AROM;Right;10 reps;Supine Goniometric ROM: ~5-65 degrees    General Comments        Pertinent Vitals/Pain Pain Assessment: 0-10 Pain Score: 5  Pain Location: R knee  Pain Descriptors / Indicators: Sore Pain Intervention(s): Monitored during session;Repositioned;Ice applied    Home Living                      Prior Function            PT Goals (current goals can now be found in the care plan section) Progress towards PT goals: Progressing toward goals    Frequency    7X/week      PT Plan Current plan remains appropriate    Co-evaluation              AM-PAC PT "6 Clicks" Mobility   Outcome Measure  Help needed turning from your back to  your side while in a flat bed without using bedrails?: A Little Help needed moving from lying on your back to sitting on the side of a flat bed without using bedrails?: A Little Help needed moving to and from a bed to a chair (including a wheelchair)?: A Little Help needed standing up from a chair using your arms (e.g., wheelchair or bedside chair)?: A Little Help needed to walk in hospital room?: A Little Help needed climbing 3-5 steps with a railing? : A Little 6 Click Score: 18    End of Session Equipment Utilized During Treatment: Gait belt Activity Tolerance: Patient tolerated treatment well Patient left: in bed;with call bell/phone within reach   PT Visit Diagnosis: Other abnormalities of gait and mobility (R26.89);Pain Pain -  Right/Left: Right Pain - part of body: Knee     Time: 3016-0109 PT Time Calculation (min) (ACUTE ONLY): 26 min  Charges:  $Gait Training: 8-22 mins $Therapeutic Exercise: 8-22 mins                        Weston Anna, PT Acute Rehabilitation Services Pager: [phone] Office: [phone]

## 2018-11-06 NOTE — Care Management Note (Signed)
Case Management Note  Patient Details  Name: SHERI GATCHEL MRN: [id number] Date of Birth: Nov 16, 1955  Subjective/Objective:      Spoke with patient at bedside. Confirmed plan for OP PT, already arranged. Has RW and 3n1. [phone]              Action/Plan:   Expected Discharge Date:  11/06/18               Expected Discharge Plan:  OP Rehab  In-House Referral:  NA  Discharge planning Services  CM Consult  Post Acute Care Choice:  NA Choice offered to:  Patient  DME Arranged:  N/A DME Agency:  NA  HH Arranged:  NA HH Agency:  NA  Status of Service:  Completed, signed off  If discussed at [place] of Stay Meetings, dates discussed:    Additional Comments:  Guadalupe Maple, RN 11/06/2018, 11:37 AM

## 2018-11-06 NOTE — Progress Notes (Signed)
Patient discharged to home w/ family. Given all belongings, instructions. Verbalized understanding of all instructions. Escorted to pov via w/c. 

## 2018-11-07 NOTE — Discharge Summary (Signed)
Physician Discharge Summary   Patient ID: Anne [name] MRN: [id number] DOB/AGE: May 21, 1956 63 y.o.  Admit date: 11/05/2018 Discharge date: 11/06/2018  Primary Diagnosis: Osteoarthritis, right knee   Admission Diagnoses:  Past Medical History:  Diagnosis Date  . Anxiety   . Arthritis   . Cataract    hx of  . Chronic low back pain   . Deviated septum   . Dyslipidemia   . GERD (gastroesophageal reflux disease)    occ  . History of hiatal hernia   . Hypertension   . Legally blind    Right eye  . Major depression   . Migraines   . Morbid obesity ([place])   . Pneumonia    Discharge Diagnoses:   Active Problems:   OA (osteoarthritis) of knee  Estimated body mass index is 37.28 kg/m as calculated from the following:   Height as of this encounter: 5\' 6"  (1.676 m).   Weight as of this encounter: 104.8 kg.  Procedure:  Procedure(s) (LRB): TOTAL KNEE ARTHROPLASTY (Right)   Consults: None  HPI: Anne [name] is a 63 y.o. year old female with end stage OA of her right knee with progressively worsening pain and dysfunction. She has constant pain, with activity and at rest and significant functional deficits with difficulties even with ADLs. She has had extensive non-op management including analgesics, injections of cortisone and viscosupplements, and home exercise program, but remains in significant pain with significant dysfunction.Radiographs show bone on bone arthritis medial and patellofemoral. She presents now for right Total Knee Arthroplasty.    Laboratory Data: Admission on 11/05/2018, Discharged on 11/06/2018  Component Date Value Ref Range Status  . WBC 11/06/2018 13.5* 4.0 - 10.5 K/uL Final  . RBC 11/06/2018 3.55* 3.87 - 5.11 MIL/uL Final  . Hemoglobin 11/06/2018 10.6* 12.0 - 15.0 g/dL Final  . HCT 11/06/2018 33.4* 36.0 - 46.0 % Final  . MCV 11/06/2018 94.1  80.0 - 100.0 fL Final  . MCH 11/06/2018 29.9  26.0 - 34.0 pg Final  . MCHC 11/06/2018 31.7  30.0 - 36.0 g/dL Final    . RDW 11/06/2018 13.5  11.5 - 15.5 % Final  . Platelets 11/06/2018 180  150 - 400 K/uL Final  . nRBC 11/06/2018 0.0  0.0 - 0.2 % Final   Performed at [facility], [place] [address] [place], [place] [address]  . Sodium 11/06/2018 141  135 - 145 mmol/L Final  . Potassium 11/06/2018 3.4* 3.5 - 5.1 mmol/L Final  . Chloride 11/06/2018 111  98 - 111 mmol/L Final  . CO2 11/06/2018 25  22 - 32 mmol/L Final  . Glucose, Bld 11/06/2018 166* 70 - 99 mg/dL Final  . BUN 11/06/2018 13  8 - 23 mg/dL Final  . Creatinine, Ser 11/06/2018 0.66  0.44 - 1.00 mg/dL Final  . Calcium 11/06/2018 8.1* 8.9 - 10.3 mg/dL Final  . GFR calc non Af Amer 11/06/2018 >60  >60 mL/min Final  . GFR calc Af Amer 11/06/2018 >60  >60 mL/min Final  . Anion gap 11/06/2018 5  5 - 15 Final   Performed at [facility], [place] [address], [place], [place] [address]  Hospital Outpatient Visit on 10/31/2018  Component Date Value Ref Range Status  . aPTT 10/31/2018 24  24 - 36 seconds Final   Performed at [facility], [place] [address], [place], [place] [address]  . WBC 10/31/2018 5.4  4.0 - 10.5 K/uL Final  . RBC 10/31/2018 4.43  3.87 - 5.11 MIL/uL Final  . Hemoglobin 10/31/2018 13.4  12.0 - 15.0 g/dL Final  . HCT 10/31/2018 41.2  36.0 - 46.0 % Final  . MCV 10/31/2018 93.0  80.0 - 100.0 fL Final  . MCH 10/31/2018 30.2  26.0 - 34.0 pg Final  . MCHC 10/31/2018 32.5  30.0 - 36.0 g/dL Final  . RDW 10/31/2018 13.5  11.5 - 15.5 % Final  . Platelets 10/31/2018 183  150 - 400 K/uL Final  . nRBC 10/31/2018 0.0  0.0 - 0.2 % Final  . Neutrophils Relative % 10/31/2018 59  % Final  . Neutro Abs 10/31/2018 3.2  1.7 - 7.7 K/uL Final  . Lymphocytes Relative 10/31/2018 30  % Final  . Lymphs Abs 10/31/2018 1.6  0.7 - 4.0 K/uL Final  . Monocytes Relative 10/31/2018 5  % Final  . Monocytes Absolute 10/31/2018 0.3  0.1 - 1.0 K/uL Final  . Eosinophils Relative 10/31/2018 4  % Final  . Eosinophils  Absolute 10/31/2018 0.2  0.0 - 0.5 K/uL Final  . Basophils Relative 10/31/2018 1  % Final  . Basophils Absolute 10/31/2018 0.0  0.0 - 0.1 K/uL Final  . Immature Granulocytes 10/31/2018 1  % Final  . Abs Immature Granulocytes 10/31/2018 0.03  0.00 - 0.07 K/uL Final   Performed at [facility], [place] [address] [place], [place] [address]  . Sodium 10/31/2018 139  135 - 145 mmol/L Final  . Potassium 10/31/2018 3.9  3.5 - 5.1 mmol/L Final  . Chloride 10/31/2018 105  98 - 111 mmol/L Final  . CO2 10/31/2018 26  22 - 32 mmol/L Final  . Glucose, Bld 10/31/2018 107* 70 - 99 mg/dL Final  . BUN 10/31/2018 18  8 - 23 mg/dL Final  . Creatinine, Ser 10/31/2018 0.62  0.44 - 1.00 mg/dL Final  . Calcium 10/31/2018 10.2  8.9 - 10.3 mg/dL Final  . Total Protein 10/31/2018 7.5  6.5 - 8.1 g/dL Final  . Albumin 10/31/2018 4.5  3.5 - 5.0 g/dL Final  . AST 10/31/2018 23  15 - 41 U/L Final  . ALT 10/31/2018 24  0 - 44 U/L Final  . Alkaline Phosphatase 10/31/2018 86  38 - 126 U/L Final  . Total Bilirubin 10/31/2018 1.2  0.3 - 1.2 mg/dL Final  . GFR calc non Af Amer 10/31/2018 >60  >60 mL/min Final  . GFR calc Af Amer 10/31/2018 >60  >60 mL/min Final  . Anion gap 10/31/2018 8  5 - 15 Final   Performed at [facility], [place] [address], [place], [place] [address]  . Prothrombin Time 10/31/2018 11.6  11.4 - 15.2 seconds Final  . INR 10/31/2018 0.85   Final   Performed at [place] [address], [place], [place] [address]  . ABO/RH(D) 10/31/2018 O POS   Final  . Antibody Screen 10/31/2018 NEG   Final  . Sample Expiration 10/31/2018 11/08/2018   Final  . Extend sample reason 10/31/2018    Final                   Value:NO TRANSFUSIONS OR PREGNANCY IN THE PAST 3 MONTHS Performed at [facility], [place] [address] [place], [place] [address]   . Color, Urine 10/31/2018 STRAW* YELLOW Final  . APPearance 10/31/2018 CLEAR  CLEAR Final  .  Specific Gravity, Urine 10/31/2018 1.012  1.005 - 1.030 Final  . pH 10/31/2018 7.0  5.0 - 8.0 Final  .  Glucose, UA 10/31/2018 NEGATIVE  NEGATIVE mg/dL Final  . Hgb urine dipstick 10/31/2018 NEGATIVE  NEGATIVE Final  . Bilirubin Urine 10/31/2018 NEGATIVE  NEGATIVE Final  . Ketones, ur 10/31/2018 NEGATIVE  NEGATIVE mg/dL Final  . Protein, ur 10/31/2018 NEGATIVE  NEGATIVE mg/dL Final  . Nitrite 10/31/2018 NEGATIVE  NEGATIVE Final  . Leukocytes, UA 10/31/2018 NEGATIVE  NEGATIVE Final   Performed at [place] [address] [place], [place] [address]  . MRSA, PCR 10/31/2018 POSITIVE* NEGATIVE Final   Comment: RESULT CALLED TO, READ BACK BY AND VERIFIED WITH: Brigid Re [id number] @ [facility]   . Staphylococcus aureus 10/31/2018 POSITIVE* NEGATIVE Final   Comment: (NOTE) The Xpert SA Assay (FDA approved for NASAL specimens in patients 28 years of age and older), is one component of a comprehensive surveillance program. It is not intended to diagnose infection nor to guide or monitor treatment. Performed at [facility], [place] [address] [place], [place] [address]      X-Rays:No results found.  EKG: Orders placed or performed during the hospital encounter of 12/27/14  . ED EKG (<79mins upon arrival to the ED)  . ED EKG (<79mins upon arrival to the ED)  . EKG 12-Lead  . EKG 12-Lead  . EKG     Hospital Course: Anne [name] is a 63 y.o. who was admitted to [facility]. They were brought to the operating room on 11/05/2018 and underwent Procedure(s): TOTAL KNEE ARTHROPLASTY.  Patient tolerated the procedure well and was later transferred to the recovery room and then to the orthopaedic floor for postoperative care. They were given PO and IV analgesics for pain control following their surgery. They were given 24 hours of postoperative antibiotics of  Anti-infectives (From admission, onward)   Start     Dose/Rate Route Frequency Ordered  Stop   11/05/18 1400  ceFAZolin (ANCEF) IVPB 2g/100 mL premix     2 g 200 mL/hr over 30 Minutes Intravenous Every 6 hours 11/05/18 1142 11/05/18 2107   11/05/18 0645  ceFAZolin (ANCEF) IVPB 2g/100 mL premix     2 g 200 mL/hr over 30 Minutes Intravenous On call to O.R. 11/05/18 6301 11/05/18 0817   11/05/18 0600  vancomycin (VANCOCIN) 1,500 mg in sodium chloride 0.9 % 500 mL IVPB     1,500 mg 250 mL/hr over 120 Minutes Intravenous  Once 11/04/18 0953 11/05/18 0945     and started on DVT prophylaxis in the form of Aspirin.   PT and OT were ordered for total joint protocol. Discharge planning consulted to help with postop disposition and equipment needs.  Patient had a good night on the evening of surgery. They started to get up OOB with therapy on POD #0. Pt was seen during rounds and was ready to go home pending progress with therapy. Potassium dropped to 3.4 that AM, one dose of 40 mEq KCl was ordered. BP was soft overnight as well, and a 250 mL bolus was ordered. Hemovac drain was pulled without difficulty. She worked with therapy on POD #1 and was meeting her goals. Pt was discharged to home later that day in stable condition.  Diet: Regular diet Activity: WBAT Follow-up: in 2 weeks Disposition: Home with outpatient PT at [facility] Discharged Condition: stable   Discharge Instructions    Call MD / Call 911   Complete by:  As directed    If you experience chest pain or shortness of breath, CALL 911 and be  transported to the hospital emergency room.  If you develope a fever above 101 F, pus (white drainage) or increased drainage or redness at the wound, or calf pain, call your surgeon's office.   Change dressing   Complete by:  As directed    Change dressing on Wednesday, then change the dressing daily with sterile 4 x 4 inch gauze dressing and apply TED hose.   Constipation Prevention   Complete by:  As directed    Drink plenty of fluids.  Prune juice may be helpful.  You may use a  stool softener, such as Colace (over the counter) 100 mg twice a day.  Use MiraLax (over the counter) for constipation as needed.   Diet - low sodium heart healthy   Complete by:  As directed    Discharge instructions   Complete by:  As directed    Dr. Gaynelle Arabian Total Joint Specialist [facility] [address], [place], [place] [address] [phone]  TOTAL KNEE REPLACEMENT POSTOPERATIVE DIRECTIONS  Knee Rehabilitation, Guidelines Following Surgery  Results after knee surgery are often greatly improved when you follow the exercise, range of motion and muscle strengthening exercises prescribed by your doctor. Safety measures are also important to protect the knee from further injury. Any time any of these exercises cause you to have increased pain or swelling in your knee joint, decrease the amount until you are comfortable again and slowly increase them. If you have problems or questions, call your caregiver or physical therapist for advice.   HOME CARE INSTRUCTIONS  Remove items at home which could result in a fall. This includes throw rugs or furniture in walking pathways.  ICE to the affected knee every three hours for 30 minutes at a time and then as needed for pain and swelling.  Continue to use ice on the knee for pain and swelling from surgery. You may notice swelling that will progress down to the foot and ankle.  This is normal after surgery.  Elevate the leg when you are not up walking on it.   Continue to use the breathing machine which will help keep your temperature down.  It is common for your temperature to cycle up and down following surgery, especially at night when you are not up moving around and exerting yourself.  The breathing machine keeps your lungs expanded and your temperature down. Do not place pillow under knee, focus on keeping the knee straight while resting   DIET You may resume your previous home diet once your are discharged from the  hospital.  DRESSING / WOUND CARE / SHOWERING You may shower 3 days after surgery, but keep the wounds dry during showering.  You may use an occlusive plastic wrap (Press'n Seal for example), NO SOAKING/SUBMERGING IN THE BATHTUB.  If the bandage gets wet, change with a clean dry gauze.  If the incision gets wet, pat the wound dry with a clean towel. You may start showering once you are discharged home but do not submerge the incision under water. Just pat the incision dry and apply a dry gauze dressing on daily. Change the surgical dressing daily and reapply a dry dressing each time.  ACTIVITY Walk with your walker as instructed. Use walker as long as suggested by your caregivers. Avoid periods of inactivity such as sitting longer than an hour when not asleep. This helps prevent blood clots.  You may resume a sexual relationship in one month or when given the OK by your  doctor.  You may return to work once you are cleared by your doctor.  Do not drive a car for 6 weeks or until released by you surgeon.  Do not drive while taking narcotics.  WEIGHT BEARING Weight bearing as tolerated with assist device (walker, cane, etc) as directed, use it as long as suggested by your surgeon or therapist, typically at least 4-6 weeks.  POSTOPERATIVE CONSTIPATION PROTOCOL Constipation - defined medically as fewer than three stools per week and severe constipation as less than one stool per week.  One of the most common issues patients have following surgery is constipation.  Even if you have a regular bowel pattern at home, your normal regimen is likely to be disrupted due to multiple reasons following surgery.  Combination of anesthesia, postoperative narcotics, change in appetite and fluid intake all can affect your bowels.  In order to avoid complications following surgery, here are some recommendations in order to help you during your recovery period.  Colace (docusate) - Pick up an over-the-counter form  of Colace or another stool softener and take twice a day as long as you are requiring postoperative pain medications.  Take with a full glass of water daily.  If you experience loose stools or diarrhea, hold the colace until you stool forms back up.  If your symptoms do not get better within 1 week or if they get worse, check with your doctor.  Dulcolax (bisacodyl) - Pick up over-the-counter and take as directed by the product packaging as needed to assist with the movement of your bowels.  Take with a full glass of water.  Use this product as needed if not relieved by Colace only.   MiraLax (polyethylene glycol) - Pick up over-the-counter to have on hand.  MiraLax is a solution that will increase the amount of water in your bowels to assist with bowel movements.  Take as directed and can mix with a glass of water, juice, soda, coffee, or tea.  Take if you go more than two days without a movement. Do not use MiraLax more than once per day. Call your doctor if you are still constipated or irregular after using this medication for 7 days in a row.  If you continue to have problems with postoperative constipation, please contact the office for further assistance and recommendations.  If you experience "the worst abdominal pain ever" or develop nausea or vomiting, please contact the office immediatly for further recommendations for treatment.  ITCHING  If you experience itching with your medications, try taking only a single pain pill, or even half a pain pill at a time.  You can also use Benadryl over the counter for itching or also to help with sleep.   TED HOSE STOCKINGS Wear the elastic stockings on both legs for three weeks following surgery during the day but you may remove then at night for sleeping.  MEDICATIONS See your medication summary on the "After Visit Summary" that the nursing staff will review with you prior to discharge.  You may have some home medications which will be placed on hold  until you complete the course of blood thinner medication.  It is important for you to complete the blood thinner medication as prescribed by your surgeon.  Continue your approved medications as instructed at time of discharge.  PRECAUTIONS If you experience chest pain or shortness of breath - call 911 immediately for transfer to the hospital emergency department.  If you develop a fever greater that 101 F,  purulent drainage from wound, increased redness or drainage from wound, foul odor from the wound/dressing, or calf pain - CONTACT YOUR SURGEON.                                                   FOLLOW-UP APPOINTMENTS Make sure you keep all of your appointments after your operation with your surgeon and caregivers. You should call the office at the above phone number and make an appointment for approximately two weeks after the date of your surgery or on the date instructed by your surgeon outlined in the "After Visit Summary".   RANGE OF MOTION AND STRENGTHENING EXERCISES  Rehabilitation of the knee is important following a knee injury or an operation. After just a few days of immobilization, the muscles of the thigh which control the knee become weakened and shrink (atrophy). Knee exercises are designed to build up the tone and strength of the thigh muscles and to improve knee motion. Often times heat used for twenty to thirty minutes before working out will loosen up your tissues and help with improving the range of motion but do not use heat for the first two weeks following surgery. These exercises can be done on a training (exercise) mat, on the floor, on a table or on a bed. Use what ever works the best and is most comfortable for you Knee exercises include:  Leg Lifts - While your knee is still immobilized in a splint or cast, you can do straight leg raises. Lift the leg to 60 degrees, hold for 3 sec, and slowly lower the leg. Repeat 10-20 times 2-3 times daily. Perform this exercise against  resistance later as your knee gets better.  Quad and Hamstring Sets - Tighten up the muscle on the front of the thigh (Quad) and hold for 5-10 sec. Repeat this 10-20 times hourly. Hamstring sets are done by pushing the foot backward against an object and holding for 5-10 sec. Repeat as with quad sets.  Leg Slides: Lying on your back, slowly slide your foot toward your buttocks, bending your knee up off the floor (only go as far as is comfortable). Then slowly slide your foot back down until your leg is flat on the floor again. Angel Wings: Lying on your back spread your legs to the side as far apart as you can without causing discomfort.  A rehabilitation program following serious knee injuries can speed recovery and prevent re-injury in the future due to weakened muscles. Contact your doctor or a physical therapist for more information on knee rehabilitation.   IF YOU ARE TRANSFERRED TO A SKILLED REHAB FACILITY If the patient is transferred to a skilled rehab facility following release from the hospital, a list of the current medications will be sent to the facility for the patient to continue.  When discharged from the skilled rehab facility, please have the facility set up the patient's [place] prior to being released. Also, the skilled facility will be responsible for providing the patient with their medications at time of release from the facility to include their pain medication, the muscle relaxants, and their blood thinner medication. If the patient is still at the rehab facility at time of the two week follow up appointment, the skilled rehab facility will also need to assist the patient in arranging follow up appointment  in our office and any transportation needs.  MAKE SURE YOU:  Understand these instructions.  Get help right away if you are not doing well or get worse.    Pick up stool softner and laxative for home use following surgery while on pain medications. Do  not submerge incision under water. Please use good hand washing techniques while changing dressing each day. May shower starting three days after surgery. Please use a clean towel to pat the incision dry following showers. Continue to use ice for pain and swelling after surgery. Do not use any lotions or creams on the incision until instructed by your surgeon.   Do not put a pillow under the knee. Place it under the heel.   Complete by:  As directed    Driving restrictions   Complete by:  As directed    No driving for two weeks   TED hose   Complete by:  As directed    Use stockings (TED hose) for three weeks on both leg(s).  You may remove them at night for sleeping.   Weight bearing as tolerated   Complete by:  As directed      Allergies as of 11/06/2018   No Known Allergies     Medication List    STOP taking these medications   aspirin-acetaminophen-caffeine 250-250-65 MG tablet Commonly known as:  EXCEDRIN MIGRAINE   ibuprofen 200 MG tablet Commonly known as:  ADVIL,MOTRIN   rivaroxaban 10 MG Tabs tablet Commonly known as:  XARELTO     TAKE these medications   ALPRAZolam 1 MG tablet Commonly known as:  XANAX Take 1 mg by mouth at bedtime as needed for sleep.   aspirin 325 MG EC tablet Take 1 tablet (325 mg total) by mouth 2 (two) times daily for 20 days. Then resume one 81 mg aspirin once a day. What changed:    medication strength  how much to take  when to take this  additional instructions   atorvastatin 20 MG tablet Commonly known as:  LIPITOR Take 20 mg by mouth at bedtime.   benzonatate 100 MG capsule Commonly known as:  TESSALON Take 1 capsule (100 mg total) by mouth every 8 (eight) hours. What changed:    when to take this  reasons to take this   BLACK CURRANT SEED OIL PO Take 1,250 mg by mouth daily.   buPROPion 150 MG 24 hr tablet Commonly known as:  WELLBUTRIN XL Take 150 mg by mouth daily.   calcium carbonate 500 MG chewable  tablet Commonly known as:  TUMS - dosed in mg elemental calcium Chew 1-2 tablets by mouth daily as needed for indigestion or heartburn.   CALTRATE 600+D PO Take 1 tablet by mouth daily.   Feverfew 380 MG Caps Take 380 mg by mouth daily.   FISH OIL PO Take 720 mg by mouth daily.   gabapentin 300 MG capsule Commonly known as:  NEURONTIN Take 1 capsule (300 mg total) by mouth 3 (three) times daily. Take a 300 mg capsule three times a day for two weeks following surgery.Then take a 300 mg capsule two times a day for two weeks. Then take a 300 mg capsule once a day for two weeks. Then discontinue.   GARCINIA CAMBOGIA-CHROMIUM PO Take 2,100 mg by mouth 3 (three) times daily.   GENTEAL OP Apply 1 drop to eye 3 (three) times daily as needed (dry eyes).   methocarbamol 500 MG tablet Commonly known as:  ROBAXIN Take 1  tablet (500 mg total) by mouth every 6 (six) hours as needed for muscle spasms.   multivitamin with minerals Tabs tablet Take 1 tablet by mouth daily.   OVER THE COUNTER MEDICATION Take 1 tablet by mouth 3 (three) times a week. [place] otc supplement   oxyCODONE 5 MG immediate release tablet Commonly known as:  Oxy IR/ROXICODONE Take 1-2 tablets (5-10 mg total) by mouth every 6 (six) hours as needed for severe pain.   potassium chloride SA 20 MEQ tablet Commonly known as:  K-DUR,KLOR-CON Take 20 mEq by mouth at bedtime.   sodium chloride 0.65 % Soln nasal spray Commonly known as:  OCEAN Place 1 spray into both nostrils as needed for congestion.   traMADol 50 MG tablet Commonly known as:  ULTRAM Take 1-2 tablets (50-100 mg total) by mouth every 6 (six) hours as needed for moderate pain.   traZODone 150 MG tablet Commonly known as:  DESYREL Take 150 mg by mouth at bedtime as needed for sleep.   triamterene-hydrochlorothiazide 75-50 MG tablet Commonly known as:  MAXZIDE Take 0.5 tablets by mouth daily.   vitamin C 250 MG tablet Commonly known as:  ASCORBIC  ACID Take 500 mg by mouth daily.            Discharge Care Instructions  (From admission, onward)         Start     Ordered   11/06/18 0000  Weight bearing as tolerated     11/06/18 0741   11/06/18 0000  Change dressing    Comments:  Change dressing on Wednesday, then change the dressing daily with sterile 4 x 4 inch gauze dressing and apply TED hose.   11/06/18 0741         Follow-up Information    Gaynelle Arabian, MD. Schedule an appointment as soon as possible for a visit on 11/20/2018.   Specialty:  Orthopedic Surgery Contact information: [address] [place] [address] [phone]        Home, [facility]   Specialty:  [facility] Contact information: [place] [address] [phone]           Signed: Theresa Duty, PA-C Orthopedic Surgery 11/07/2018, 7:38 AM

## 2018-11-12 DIAGNOSIS — M25661 Stiffness of right knee, not elsewhere classified: Secondary | ICD-10-CM | POA: Diagnosis not present

## 2018-11-16 DIAGNOSIS — M25661 Stiffness of right knee, not elsewhere classified: Secondary | ICD-10-CM | POA: Diagnosis not present

## 2018-11-20 DIAGNOSIS — M25661 Stiffness of right knee, not elsewhere classified: Secondary | ICD-10-CM | POA: Diagnosis not present

## 2018-11-22 DIAGNOSIS — M25661 Stiffness of right knee, not elsewhere classified: Secondary | ICD-10-CM | POA: Diagnosis not present

## 2018-11-27 DIAGNOSIS — M25661 Stiffness of right knee, not elsewhere classified: Secondary | ICD-10-CM | POA: Diagnosis not present

## 2018-11-29 DIAGNOSIS — M25661 Stiffness of right knee, not elsewhere classified: Secondary | ICD-10-CM | POA: Diagnosis not present

## 2018-12-04 DIAGNOSIS — M25661 Stiffness of right knee, not elsewhere classified: Secondary | ICD-10-CM | POA: Diagnosis not present

## 2018-12-11 DIAGNOSIS — Z471 Aftercare following joint replacement surgery: Secondary | ICD-10-CM | POA: Diagnosis not present

## 2018-12-11 DIAGNOSIS — Z96651 Presence of right artificial knee joint: Secondary | ICD-10-CM | POA: Diagnosis not present

## 2019-01-15 DIAGNOSIS — I10 Essential (primary) hypertension: Secondary | ICD-10-CM | POA: Diagnosis not present

## 2019-01-15 DIAGNOSIS — F419 Anxiety disorder, unspecified: Secondary | ICD-10-CM | POA: Diagnosis not present

## 2019-01-15 DIAGNOSIS — E785 Hyperlipidemia, unspecified: Secondary | ICD-10-CM | POA: Diagnosis not present

## 2019-03-05 DIAGNOSIS — M79671 Pain in right foot: Secondary | ICD-10-CM | POA: Insufficient documentation

## 2019-03-05 DIAGNOSIS — M25571 Pain in right ankle and joints of right foot: Secondary | ICD-10-CM | POA: Diagnosis not present

## 2019-04-01 DIAGNOSIS — M25571 Pain in right ankle and joints of right foot: Secondary | ICD-10-CM | POA: Diagnosis not present

## 2019-04-18 DIAGNOSIS — R739 Hyperglycemia, unspecified: Secondary | ICD-10-CM | POA: Diagnosis not present

## 2019-04-18 DIAGNOSIS — I10 Essential (primary) hypertension: Secondary | ICD-10-CM | POA: Diagnosis not present

## 2019-04-18 DIAGNOSIS — Z0000 Encounter for general adult medical examination without abnormal findings: Secondary | ICD-10-CM | POA: Diagnosis not present

## 2019-05-15 DIAGNOSIS — L720 Epidermal cyst: Secondary | ICD-10-CM | POA: Diagnosis not present

## 2019-05-15 DIAGNOSIS — L814 Other melanin hyperpigmentation: Secondary | ICD-10-CM | POA: Diagnosis not present

## 2019-05-15 DIAGNOSIS — D2272 Melanocytic nevi of left lower limb, including hip: Secondary | ICD-10-CM | POA: Diagnosis not present

## 2019-05-15 DIAGNOSIS — D225 Melanocytic nevi of trunk: Secondary | ICD-10-CM | POA: Diagnosis not present

## 2019-05-15 DIAGNOSIS — D485 Neoplasm of uncertain behavior of skin: Secondary | ICD-10-CM | POA: Diagnosis not present

## 2019-05-15 DIAGNOSIS — L821 Other seborrheic keratosis: Secondary | ICD-10-CM | POA: Diagnosis not present

## 2019-05-27 DIAGNOSIS — D485 Neoplasm of uncertain behavior of skin: Secondary | ICD-10-CM | POA: Diagnosis not present

## 2019-05-27 DIAGNOSIS — L988 Other specified disorders of the skin and subcutaneous tissue: Secondary | ICD-10-CM | POA: Diagnosis not present

## 2019-07-08 ENCOUNTER — Other Ambulatory Visit: Payer: Self-pay | Admitting: Internal Medicine

## 2019-07-08 DIAGNOSIS — Z1231 Encounter for screening mammogram for malignant neoplasm of breast: Secondary | ICD-10-CM

## 2019-08-07 DIAGNOSIS — H52203 Unspecified astigmatism, bilateral: Secondary | ICD-10-CM | POA: Diagnosis not present

## 2019-08-07 DIAGNOSIS — Z961 Presence of intraocular lens: Secondary | ICD-10-CM | POA: Diagnosis not present

## 2019-08-22 ENCOUNTER — Other Ambulatory Visit: Payer: Self-pay

## 2019-08-22 ENCOUNTER — Ambulatory Visit
Admission: RE | Admit: 2019-08-22 | Discharge: 2019-08-22 | Disposition: A | Discharge status: Home / Self Care | Payer: BLUE CROSS/BLUE SHIELD | Source: Ambulatory Visit | Attending: Internal Medicine | Admitting: Internal Medicine

## 2019-08-22 DIAGNOSIS — Z1231 Encounter for screening mammogram for malignant neoplasm of breast: Secondary | ICD-10-CM

## 2019-10-10 DIAGNOSIS — Z0000 Encounter for general adult medical examination without abnormal findings: Secondary | ICD-10-CM | POA: Diagnosis not present

## 2019-10-10 DIAGNOSIS — I10 Essential (primary) hypertension: Secondary | ICD-10-CM | POA: Diagnosis not present

## 2019-10-10 DIAGNOSIS — E7800 Pure hypercholesterolemia, unspecified: Secondary | ICD-10-CM | POA: Diagnosis not present

## 2019-10-10 DIAGNOSIS — E559 Vitamin D deficiency, unspecified: Secondary | ICD-10-CM | POA: Diagnosis not present

## 2019-10-10 DIAGNOSIS — R739 Hyperglycemia, unspecified: Secondary | ICD-10-CM | POA: Diagnosis not present

## 2019-10-15 DIAGNOSIS — F419 Anxiety disorder, unspecified: Secondary | ICD-10-CM | POA: Diagnosis not present

## 2019-10-15 DIAGNOSIS — E559 Vitamin D deficiency, unspecified: Secondary | ICD-10-CM | POA: Diagnosis not present

## 2019-10-15 DIAGNOSIS — I10 Essential (primary) hypertension: Secondary | ICD-10-CM | POA: Diagnosis not present

## 2019-10-15 DIAGNOSIS — E876 Hypokalemia: Secondary | ICD-10-CM | POA: Diagnosis not present

## 2019-10-15 DIAGNOSIS — E7800 Pure hypercholesterolemia, unspecified: Secondary | ICD-10-CM | POA: Diagnosis not present

## 2019-10-28 ENCOUNTER — Other Ambulatory Visit: Payer: Self-pay | Admitting: Internal Medicine

## 2019-10-28 DIAGNOSIS — I8393 Asymptomatic varicose veins of bilateral lower extremities: Secondary | ICD-10-CM

## 2019-11-07 ENCOUNTER — Ambulatory Visit
Admission: RE | Admit: 2019-11-07 | Discharge: 2019-11-07 | Disposition: A | Discharge status: Home / Self Care | Payer: BC Managed Care – PPO | Source: Ambulatory Visit | Attending: Internal Medicine | Admitting: Internal Medicine

## 2019-11-07 ENCOUNTER — Other Ambulatory Visit: Payer: Self-pay | Admitting: Interventional Radiology

## 2019-11-07 ENCOUNTER — Other Ambulatory Visit: Payer: Self-pay | Admitting: Internal Medicine

## 2019-11-07 DIAGNOSIS — I8393 Asymptomatic varicose veins of bilateral lower extremities: Secondary | ICD-10-CM

## 2019-11-07 DIAGNOSIS — I83812 Varicose veins of left lower extremities with pain: Secondary | ICD-10-CM | POA: Diagnosis not present

## 2019-11-07 NOTE — Consult Note (Signed)
Chief Complaint: Patient was seen in consultation today for painful left lower extremity varicose veins at the request of Pharr,Walter  Referring Physician(s): Pharr,Walter  History of Present Illness: Anne [name] is a 64 y.o. 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  Over the past 2 months, she is having more pain in her posterior left calf in the region of the varicose veins.  No previous varicose or spider vein treatment.  No history of venous ulceration, DVT, superficial thrombophlebitis, or pulmonary embolism.  There is a history of varicose veins in her mother.  She occasionally uses support hose with some relief of symptoms.  Her job and continues to require standing 6 to 8 hours a day.  She uses Tylenol for pain control.  She does feel that leg elevation improves her symptoms.  She is diabetic.  She does not have any symptoms in the right lower extremity.  Past Medical History:  Diagnosis Date   Anxiety    Arthritis    Cataract    hx of   Chronic low back pain    Deviated septum    Dyslipidemia    GERD (gastroesophageal reflux disease)    occ   History of hiatal hernia    Hypertension    Legally blind    Right eye   Major depression    Migraines    Morbid obesity ([place])    Pneumonia     Past Surgical History:  Procedure Laterality Date   CATARACT EXTRACTION, BILATERAL     CHOLECYSTECTOMY     COLONOSCOPY WITH PROPOFOL N/A 11/12/2013   Procedure: COLONOSCOPY WITH PROPOFOL;  Surgeon: Garlan Fair, MD;  Location: [facility];  Service: Endoscopy;  Laterality: N/A;   lower back surgery     RETINAL DETACHMENT SURGERY Right    x2   right arm tendon repair  2005   ROTATOR CUFF REPAIR Bilateral    both sholulders   TOTAL KNEE ARTHROPLASTY Left 12/26/2016   Procedure: LEFT TOTAL KNEE ARTHROPLASTY;  Surgeon: Gaynelle Arabian, MD;  Location: [facility];  Service: Orthopedics;  Laterality: Left;  requests 71mins   TOTAL KNEE  ARTHROPLASTY Right 11/05/2018   Procedure: TOTAL KNEE ARTHROPLASTY;  Surgeon: Gaynelle Arabian, MD;  Location: [facility];  Service: Orthopedics;  Laterality: Right;  27min    Allergies: Patient has no known allergies.  Medications: Prior to Admission medications   Medication Sig Start Date End Date Taking? Authorizing Provider  ALPRAZolam Duanne Moron) 1 MG tablet Take 1 mg by mouth at bedtime as needed for sleep.    [provider]  atorvastatin (LIPITOR) 20 MG tablet Take 20 mg by mouth at bedtime. 12/05/16   [provider]  benzonatate (TESSALON) 100 MG capsule Take 1 capsule (100 mg total) by mouth every 8 (eight) hours. Patient taking differently: Take 100 mg by mouth every 8 (eight) hours as needed for cough.  10/03/18   Bast, Tressia Miners A, NP  BLACK CURRANT SEED OIL PO Take 1,250 mg by mouth daily.    [provider]  buPROPion (WELLBUTRIN XL) 150 MG 24 hr tablet Take 150 mg by mouth daily.    [provider]  calcium carbonate (TUMS - DOSED IN MG ELEMENTAL CALCIUM) 500 MG chewable tablet Chew 1-2 tablets by mouth daily as needed for indigestion or heartburn.    [provider]  Calcium Carbonate-Vitamin D (CALTRATE 600+D PO) Take 1 tablet by mouth daily.  [provider]  Carboxymethylcell-Hypromellose (GENTEAL OP) Apply 1 drop to eye 3 (three) times daily as needed (dry eyes).    [provider]  Feverfew 380 MG CAPS Take 380 mg by mouth daily.    [provider]  gabapentin (NEURONTIN) 300 MG capsule Take 1 capsule (300 mg total) by mouth 3 (three) times daily. Take a 300 mg capsule three times a day for two weeks following surgery.Then take a 300 mg capsule two times a day for two weeks. Then take a 300 mg capsule once a day for two weeks. Then discontinue. 11/06/18   Edmisten, Kristie L, PA  GARCINIA CAMBOGIA-CHROMIUM PO Take 2,100 mg by mouth 3 (three) times daily.    [provider]  methocarbamol (ROBAXIN) 500 MG tablet  Take 1 tablet (500 mg total) by mouth every 6 (six) hours as needed for muscle spasms. 11/06/18   Edmisten, Ok Anis, PA  Multiple Vitamin (MULTIVITAMIN WITH MINERALS) TABS tablet Take 1 tablet by mouth daily.    [provider]  Omega-3 Fatty Acids (FISH OIL PO) Take 720 mg by mouth daily.    [provider]  OVER THE COUNTER MEDICATION Take 1 tablet by mouth 3 (three) times a week. Butter[place] otc supplement    [provider]  oxyCODONE (OXY IR/ROXICODONE) 5 MG immediate release tablet Take 1-2 tablets (5-10 mg total) by mouth every 6 (six) hours as needed for severe pain. 11/06/18   Edmisten, Kristie L, PA  potassium chloride SA (K-DUR,KLOR-CON) 20 MEQ tablet Take 20 mEq by mouth at bedtime.  12/12/16   [provider]  sodium chloride (OCEAN) 0.65 % SOLN nasal spray Place 1 spray into both nostrils as needed for congestion.    [provider]  traMADol (ULTRAM) 50 MG tablet Take 1-2 tablets (50-100 mg total) by mouth every 6 (six) hours as needed for moderate pain. 11/06/18   Edmisten, Kristie L, PA  traZODone (DESYREL) 150 MG tablet Take 150 mg by mouth at bedtime as needed for sleep.    [provider]  triamterene-hydrochlorothiazide (MAXZIDE) 75-50 MG per tablet Take 0.5 tablets by mouth daily.     [provider]  vitamin C (ASCORBIC ACID) 250 MG tablet Take 500 mg by mouth daily.    [provider]     Family History  Problem Relation Age of Onset   Diabetes Mother    Hypertension Mother    Heart attack Father    Cancer Father        lung    Social History   Socioeconomic History   Marital status: Single    Spouse name: Not on file   Number of children: Not on file   Years of education: Not on file   Highest education level: Not on file  Occupational History   Not on file  Tobacco Use   Smoking status: Never Smoker   Smokeless tobacco: Never Used  Substance and Sexual Activity   Alcohol use: Yes     Comment: very rare use   Drug use: No   Sexual activity: Yes    Birth control/protection: Post-menopausal  Other Topics Concern   Not on file  Social History Narrative   Not on file   Social Determinants of Health   Financial Resource Strain:    Difficulty of Paying Living Expenses: Not on file  Food Insecurity:    Worried About [place] in the Last Year: Not on file   Ran Out  of Food in the Last Year: Not on file  Transportation Needs:    Lack of Transportation (Medical): Not on file   Lack of Transportation (Non-Medical): Not on file  Physical Activity:    Days of Exercise per Week: Not on file   Minutes of Exercise per Session: Not on file  Stress:    Feeling of Stress : Not on file  Social Connections:    Frequency of Communication with Friends and Family: Not on file   Frequency of Social Gatherings with Friends and Family: Not on file   Attends Religious Services: Not on file   Active Member of Clubs or Organizations: Not on file   Attends Archivist Meetings: Not on file   Marital Status: Not on file    ECOG Status: 1 - Symptomatic but completely ambulatory  Review of Systems: A 12 point ROS discussed and pertinent positives are indicated in the HPI above.  All other systems are negative.  Review of Systems  Vital Signs: LMP  (LMP Unknown)  Constitutional: Oriented to person, place, and time. Well-developed and well-nourished. No distress.   HENT:  Head: Normocephalic and atraumatic.  Eyes: Conjunctivae and EOM are normal. Right eye exhibits no discharge. Left eye exhibits no discharge. No scleral icterus.  Neck: No JVD present.  Pulmonary/Chest: Effort normal. No stridor. No respiratory distress.  Abdomen: soft, non distended Neurological:  alert and oriented to person, place, and time.  Skin: Skin is warm and dry.  not diaphoretic.  Psychiatric:   normal mood and affect.   behavior is normal. Judgment and thought  content normal.   Physical Exam Skin:         Comments: Varicose veins           Imaging: US Venous Img Lower Unilateral Left (DVT)  Result Date: 11/07/2019 CLINICAL DATA:  Painful left lower extremity varicose veins EXAM: LEFT LOWER EXTREMITY VENOUS DUPLEX ULTRASOUND TECHNIQUE: Gray-scale sonography with graded compression, as well as color Doppler and duplex ultrasound, were performed to evaluate the deep and superficial veins of left lower extremity. Spectral Doppler was utilized to evaluate flow at rest and with distal augmentation maneuvers. A complete superficial venous insufficiency exam was performed in the upright standing position. I personally performed the technical portion of the exam. COMPARISON:  None. FINDINGS: Deep Venous System: Evaluation of the deep venous system including the common femoral, femoral, profunda femoral, popliteal and calf veins (where visible) demonstrate no evidence of deep venous thrombosis. The vessels are compressible and demonstrate normal respiratory phasicity and response to augmentation. No evidence of the deep venous reflux. Superficial Venous System: LEFT SFJ: Patent, 9 mm diameter, 4.7 seconds reflux post augmentation GSV Prox Thigh: 6 mm diameter, 6.3 seconds reflux post augmentation GSV Mid Thigh: 6 mm, 4 seconds reflux post augmentation GSV Lower Thigh: 7 mm, 3.5 seconds reflux post augmentation GSV at Knee: 6 mm, 2 seconds reflux. Prominent associated subcutaneous varicose veins extending to the calf. GSV Prox Calf: 3 mm, no reflux GSV Mid Calf: 2 mm, no reflux GSV Distal Calf: 2 mm, no reflux SPJ: Not visualized SSV Prox: 2 mm, no reflux. Calf varicose veins drain into this segment. SSV Mid: 3 mm, no reflux SSV Distal: 2 mm, no reflux Other: None IMPRESSION: 1. No evidence of acute or chronic DVT in the left lower extremity. 2. Long segment of valvular incompetence and reflux in the left great saphenous vein from the saphenofemoral junction to the  knee, with direct communication to  the patient's symptomatic posterior calf varicose veins. 3. Normal left short saphenous vein. Electronically Signed   By: Lucrezia Europe M.D.   On: 11/07/2019 10:33   [place] RAD EVAL AND MGMT  Result Date: 11/07/2019 Please refer to "Notes" to see consult details.   Labs:  CBC: No results for input(s): WBC, HGB, HCT, PLT in the last 8760 hours.  COAGS: No results for input(s): INR, APTT in the last 8760 hours.  BMP: No results for input(s): NA, K, CL, CO2, GLUCOSE, BUN, CALCIUM, CREATININE, GFRNONAA, GFRAA in the last 8760 hours.  Invalid input(s): CMP  LIVER FUNCTION TESTS: No results for input(s): BILITOT, AST, ALT, ALKPHOS, PROT, ALBUMIN in the last 8760 hours.  TUMOR MARKERS: No results for input(s): AFPTM, CEA, CA199, CHROMGRNA in the last 8760 hours.  Assessment and Plan:  My impression is that the patient's symptomatic left posterior calf varicose veins are secondary to significant venous valvular incompetence and reflux in the left great saphenous vein extending retrograde from the saphenofemoral junction to the knee.  The deep venous system and short saphenous vein are normal.  Given her clinical symptomatology, she would be an appropriate candidate for closure of this refluxing GSV segment, with the expectation of significant resolution of her symptomatic left calf varicose veins.  I discussed with the patient the pathophysiology of superficial venous valvular incompetence and reflux, and development of varicose veins.  We discussed the variable natural history of varicose veins.  We discussed treatment options including conservative treatment and symptomatic care, endovascular  venous ablation, vein stripping, and sclerotherapy.  We discussed in detail the endovascular venous ablation technique under ultrasound guidance.  She seemed to understand, did ask appropriate questions which were answered, and is motivated to proceed.  Accordingly, we can set  her up for outpatient ultrasound-guided laser ablation of her refluxing left great saphenous vein, pending carrier approval.  I gave her some literature to review and a prescription for thigh-high 20-30 mmHg graduated compression hose, which she will need at the time of treatment.   Thank you for this interesting consult.  I greatly enjoyed meeting Mayetta A Colpitts and look forward to participating in their care.  A copy of this report was sent to the requesting provider on this date.  Electronically Signed: Rickard Rhymes 11/07/2019, 11:47 AM   I spent a total of  40 Minutes   in face to face in clinical consultation, greater than 50% of which was counseling/coordinating care for symptomatic left lower extremity varicose veins.

## 2019-11-08 ENCOUNTER — Other Ambulatory Visit: Payer: Self-pay | Admitting: Interventional Radiology

## 2019-11-08 DIAGNOSIS — R739 Hyperglycemia, unspecified: Secondary | ICD-10-CM | POA: Diagnosis not present

## 2019-11-08 DIAGNOSIS — Z0000 Encounter for general adult medical examination without abnormal findings: Secondary | ICD-10-CM | POA: Diagnosis not present

## 2019-11-08 DIAGNOSIS — E7800 Pure hypercholesterolemia, unspecified: Secondary | ICD-10-CM | POA: Diagnosis not present

## 2019-11-08 DIAGNOSIS — I872 Venous insufficiency (chronic) (peripheral): Secondary | ICD-10-CM

## 2019-11-08 DIAGNOSIS — I10 Essential (primary) hypertension: Secondary | ICD-10-CM | POA: Diagnosis not present

## 2019-11-15 DIAGNOSIS — L738 Other specified follicular disorders: Secondary | ICD-10-CM | POA: Diagnosis not present

## 2019-11-15 DIAGNOSIS — L821 Other seborrheic keratosis: Secondary | ICD-10-CM | POA: Diagnosis not present

## 2019-11-15 DIAGNOSIS — D0359 Melanoma in situ of other part of trunk: Secondary | ICD-10-CM | POA: Diagnosis not present

## 2019-11-15 DIAGNOSIS — D0362 Melanoma in situ of left upper limb, including shoulder: Secondary | ICD-10-CM | POA: Diagnosis not present

## 2019-11-15 DIAGNOSIS — L814 Other melanin hyperpigmentation: Secondary | ICD-10-CM | POA: Diagnosis not present

## 2019-11-15 DIAGNOSIS — D225 Melanocytic nevi of trunk: Secondary | ICD-10-CM | POA: Diagnosis not present

## 2019-12-03 ENCOUNTER — Other Ambulatory Visit: Payer: Self-pay | Admitting: Interventional Radiology

## 2019-12-03 ENCOUNTER — Ambulatory Visit
Admission: RE | Admit: 2019-12-03 | Discharge: 2019-12-03 | Disposition: A | Discharge status: Home / Self Care | Payer: BC Managed Care – PPO | Source: Ambulatory Visit | Attending: Interventional Radiology | Admitting: Interventional Radiology

## 2019-12-03 DIAGNOSIS — I872 Venous insufficiency (chronic) (peripheral): Secondary | ICD-10-CM

## 2019-12-03 HISTORY — PX: IR EMBO VENOUS NOT HEMORR HEMANG  INC GUIDE ROADMAPPING: IMG5447

## 2019-12-04 ENCOUNTER — Ambulatory Visit: Payer: BC Managed Care – PPO

## 2019-12-05 DIAGNOSIS — Z96653 Presence of artificial knee joint, bilateral: Secondary | ICD-10-CM | POA: Diagnosis not present

## 2019-12-05 DIAGNOSIS — Z471 Aftercare following joint replacement surgery: Secondary | ICD-10-CM | POA: Diagnosis not present

## 2019-12-05 DIAGNOSIS — Z96651 Presence of right artificial knee joint: Secondary | ICD-10-CM | POA: Diagnosis not present

## 2019-12-10 ENCOUNTER — Ambulatory Visit
Admission: RE | Admit: 2019-12-10 | Discharge: 2019-12-10 | Disposition: A | Discharge status: Home / Self Care | Payer: BC Managed Care – PPO | Source: Ambulatory Visit | Attending: Interventional Radiology | Admitting: Interventional Radiology

## 2019-12-10 DIAGNOSIS — I872 Venous insufficiency (chronic) (peripheral): Secondary | ICD-10-CM

## 2019-12-10 DIAGNOSIS — Z9889 Other specified postprocedural states: Secondary | ICD-10-CM | POA: Diagnosis not present

## 2019-12-10 DIAGNOSIS — I83812 Varicose veins of left lower extremities with pain: Secondary | ICD-10-CM | POA: Diagnosis not present

## 2019-12-10 NOTE — Progress Notes (Signed)
Patient ID: Anne [name], female   DOB: [birth year], 64 y.o.   MRN: [id number]       Chief Complaint:  Left lower extremity varicose veins, venous insufficiency, leg pain  Referring Physician(s): Hassell,Daniel  History of Present Illness: Anne [name] is a 64 y.o. female with left lower extremity GSV venous insufficiency and varicose veins with associated leg pain and fatigue.  She is now 1 week status post left GSV transcatheter laser occlusion.  She has recovered well over the last week.  No significant leg pain, edema, interval fevers or illness.  No signs of cellulitis or phlebitis.  She has been compliant with compression stockings and walking.  Overall she is recovering well.  She is asking to increase her exercise to stationary cycling.  Past Medical History:  Diagnosis Date  . Anxiety   . Arthritis   . Cataract    hx of  . Chronic low back pain   . Deviated septum   . Dyslipidemia   . GERD (gastroesophageal reflux disease)    occ  . History of hiatal hernia   . Hypertension   . Legally blind    Right eye  . Major depression   . Migraines   . Morbid obesity ([place])   . Pneumonia     Past Surgical History:  Procedure Laterality Date  . CATARACT EXTRACTION, BILATERAL    . CHOLECYSTECTOMY    . COLONOSCOPY WITH PROPOFOL N/A 11/12/2013   Procedure: COLONOSCOPY WITH PROPOFOL;  Surgeon: Garlan Fair, MD;  Location: [facility];  Service: Endoscopy;  Laterality: N/A;  . IR EMBO VENOUS NOT HEMORR HEMANG  INC GUIDE ROADMAPPING  12/03/2019  . lower back surgery    . RETINAL DETACHMENT SURGERY Right    x2  . right arm tendon repair  2005  . ROTATOR CUFF REPAIR Bilateral    both sholulders  . TOTAL KNEE ARTHROPLASTY Left 12/26/2016   Procedure: LEFT TOTAL KNEE ARTHROPLASTY;  Surgeon: Gaynelle Arabian, MD;  Location: [facility];  Service: Orthopedics;  Laterality: Left;  requests 48mins  . TOTAL KNEE ARTHROPLASTY Right 11/05/2018   Procedure: TOTAL KNEE ARTHROPLASTY;  Surgeon: Gaynelle Arabian, MD;  Location: [facility];  Service: Orthopedics;  Laterality: Right;  5min    Allergies: Patient has no known allergies.  Medications: Prior to Admission medications   Medication Sig Start Date End Date Taking? Authorizing Provider  ALPRAZolam Duanne Moron) 1 MG tablet Take 1 mg by mouth at bedtime as needed for sleep.    [provider]  atorvastatin (LIPITOR) 20 MG tablet Take 20 mg by mouth at bedtime. 12/05/16   [provider]  benzonatate (TESSALON) 100 MG capsule Take 1 capsule (100 mg total) by mouth every 8 (eight) hours. Patient taking differently: Take 100 mg by mouth every 8 (eight) hours as needed for cough.  10/03/18   Bast, Tressia Miners A, NP  BLACK CURRANT SEED OIL PO Take 1,250 mg by mouth daily.    [provider]  buPROPion (WELLBUTRIN XL) 150 MG 24 hr tablet Take 150 mg by mouth daily.    [provider]  calcium carbonate (TUMS - DOSED IN MG ELEMENTAL CALCIUM) 500 MG chewable tablet Chew 1-2 tablets by mouth daily as needed for indigestion or heartburn.    [provider]  Calcium Carbonate-Vitamin D (CALTRATE 600+D PO) Take 1 tablet by mouth daily.    [provider]  Carboxymethylcell-Hypromellose (GENTEAL OP) Apply 1 drop to eye 3 (three) times daily as needed (  dry eyes).    [provider]  Feverfew 380 MG CAPS Take 380 mg by mouth daily.    [provider]  gabapentin (NEURONTIN) 300 MG capsule Take 1 capsule (300 mg total) by mouth 3 (three) times daily. Take a 300 mg capsule three times a day for two weeks following surgery.Then take a 300 mg capsule two times a day for two weeks. Then take a 300 mg capsule once a day for two weeks. Then discontinue. 11/06/18   Edmisten, Kristie L, PA  GARCINIA CAMBOGIA-CHROMIUM PO Take 2,100 mg by mouth 3 (three) times daily.    [provider]  methocarbamol (ROBAXIN) 500 MG tablet Take 1 tablet (500 mg total) by mouth every 6 (six) hours as needed for muscle spasms.  11/06/18   Edmisten, Ok Anis, PA  Multiple Vitamin (MULTIVITAMIN WITH MINERALS) TABS tablet Take 1 tablet by mouth daily.    [provider]  Omega-3 Fatty Acids (FISH OIL PO) Take 720 mg by mouth daily.    [provider]  OVER THE COUNTER MEDICATION Take 1 tablet by mouth 3 (three) times a week. [place] otc supplement    [provider]  oxyCODONE (OXY IR/ROXICODONE) 5 MG immediate release tablet Take 1-2 tablets (5-10 mg total) by mouth every 6 (six) hours as needed for severe pain. 11/06/18   Edmisten, Kristie L, PA  potassium chloride SA (K-DUR,KLOR-CON) 20 MEQ tablet Take 20 mEq by mouth at bedtime.  12/12/16   [provider]  sodium chloride (OCEAN) 0.65 % SOLN nasal spray Place 1 spray into both nostrils as needed for congestion.    [provider]  traMADol (ULTRAM) 50 MG tablet Take 1-2 tablets (50-100 mg total) by mouth every 6 (six) hours as needed for moderate pain. 11/06/18   Edmisten, Kristie L, PA  traZODone (DESYREL) 150 MG tablet Take 150 mg by mouth at bedtime as needed for sleep.    [provider]  triamterene-hydrochlorothiazide (MAXZIDE) 75-50 MG per tablet Take 0.5 tablets by mouth daily.     [provider]  vitamin C (ASCORBIC ACID) 250 MG tablet Take 500 mg by mouth daily.    [provider]     Family History  Problem Relation Age of Onset  . Diabetes Mother   . Hypertension Mother   . Heart attack Father   . Cancer Father        lung    Social History   Socioeconomic History  . Marital status: Single    Spouse name: Not on file  . Number of children: Not on file  . Years of education: Not on file  . Highest education level: Not on file  Occupational History  . Not on file  Tobacco Use  . Smoking status: Never Smoker  . Smokeless tobacco: Never Used  Substance and Sexual Activity  . Alcohol use: Yes    Comment: very rare use  . Drug use: No  . Sexual activity: Yes    Birth  control/protection: Post-menopausal  Other Topics Concern  . Not on file  Social History Narrative  . Not on file   Social Determinants of Health   Financial Resource Strain:   . Difficulty of Paying Living Expenses: Not on file  Food Insecurity:   . Worried About [employer] in the Last Year: Not on file  . Ran Out of Food in the Last Year: Not on file  Transportation Needs:   . Lack of Transportation (  Medical): Not on file  . Lack of Transportation (Non-Medical): Not on file  Physical Activity:   . Days of Exercise per Week: Not on file  . Minutes of Exercise per Session: Not on file  Stress:   . Feeling of Stress : Not on file  Social Connections:   . Frequency of Communication with Friends and Family: Not on file  . Frequency of Social Gatherings with Friends and Family: Not on file  . Attends Religious Services: Not on file  . Active Member of Clubs or Organizations: Not on file  . Attends Archivist Meetings: Not on file  . Marital Status: Not on file     Review of Systems  Review of Systems: A 12 point ROS discussed and pertinent positives are indicated in the HPI above.  All other systems are negative.  Physical Exam Minor bruising along the left medial calf entry site.  No signs of cellulitis or phlebitis.  Skin intact.  Normal pedal pulses.  Minor peripheral edema.  Vital Signs: LMP  (LMP Unknown)   Imaging: IR EMBO VENOUS NOT HEMORR HEMANG  INC GUIDE ROADMAPPING  Result Date: 12/03/2019 CLINICAL DATA:  Symptomatic lower extremity varicose veins. EXAM: TRANSCATHETER LASER OCCLUSION LEFT GREAT SAPHENOUS VEIN WITH ULTRASOUND GUIDANCE TECHNIQUE: The procedure, risks (including but not limited to bleeding, infection, organ damage ), benefits, and alternatives were explained to the patient. Questions regarding the procedure were encouraged and answered. The patient understands and consents to the procedure. Survey ultrasound of the leg was performed  and the course of the great saphenous vein was marked on the skin. Overlying skin prepped with chlorhexidine, draped in usual sterile fashion. After local anesthetic using 1% lidocaine, the great saphenous vein was accessed at the upper calf level under ultrasound with a 21-gauge micropuncture needle. A 018 guidewire advanced easily. This was exchanged using a transitional dilator for a 035 J wire. Over this, the 6 French delivery sheath was advanced beyond the saphenofemoral junction. The laser fiber was advanced and positioned 1.5 cm from the saphenofemoral junction. Tumescent dilute 0.1% lidocaine 321mL used along the planned treatment length. Ultrasound was used to confirm appropriate tumescent anesthesia and to verify that all segments were at least 1 cm from the skin surface. The laser fiber was activated while being withdrawn over the treatment length, delivering 1524.2joules over 254.1seconds. The catheter and sheath were removed and hemostasis easily achieved at the site. Patient was placed in graduated compression stockings and ambulated for 20 minutes without difficulty. Patient tolerated the procedure well, with no immediate complication. IMPRESSION: 1. Technically successful transcatheter laser occlusion of left great saphenous vein. Patient will followup in clinic in one week. Patient knows to telephone should there be any interval questions or problems. Electronically Signed   By: Lucrezia Europe M.D.   On: 12/03/2019 12:16    Labs:  CBC: No results for input(s): WBC, HGB, HCT, PLT in the last 8760 hours.  COAGS: No results for input(s): INR, APTT in the last 8760 hours.  BMP: No results for input(s): NA, K, CL, CO2, GLUCOSE, BUN, CALCIUM, CREATININE, GFRNONAA, GFRAA in the last 8760 hours.  Invalid input(s): CMP  LIVER FUNCTION TESTS: No results for input(s): BILITOT, AST, ALT, ALKPHOS, PROT, ALBUMIN in the last 8760 hours.   Assessment and Plan:  1 week status post left GSV  transcatheter laser occlusion.  Overall she is recovering very well.  She has been compliant with daily walking and compression stockings.  No signs of  cellulitis or phlebitis.  Calf varicosities remain patent.  Plan: Outpatient follow-up in 1 month.  Assess residual calf varicosities for patency and consider ultrasound foam sclerotherapy.   Electronically Signed: Greggory Keen 12/10/2019, 9:45 AM    I spent approximately 15 minutes with this established outpatient reviewing chart, imaging and physical exam/assessment.

## 2019-12-23 DIAGNOSIS — D0362 Melanoma in situ of left upper limb, including shoulder: Secondary | ICD-10-CM | POA: Diagnosis not present

## 2019-12-23 DIAGNOSIS — L988 Other specified disorders of the skin and subcutaneous tissue: Secondary | ICD-10-CM | POA: Diagnosis not present

## 2020-01-16 ENCOUNTER — Ambulatory Visit
Admission: RE | Admit: 2020-01-16 | Discharge: 2020-01-16 | Disposition: A | Discharge status: Home / Self Care | Payer: BC Managed Care – PPO | Source: Ambulatory Visit | Attending: Interventional Radiology | Admitting: Interventional Radiology

## 2020-01-16 DIAGNOSIS — I83812 Varicose veins of left lower extremities with pain: Secondary | ICD-10-CM | POA: Diagnosis not present

## 2020-01-16 DIAGNOSIS — I82812 Embolism and thrombosis of superficial veins of left lower extremities: Secondary | ICD-10-CM | POA: Diagnosis not present

## 2020-01-16 DIAGNOSIS — I872 Venous insufficiency (chronic) (peripheral): Secondary | ICD-10-CM

## 2020-01-16 DIAGNOSIS — I8392 Asymptomatic varicose veins of left lower extremity: Secondary | ICD-10-CM | POA: Diagnosis not present

## 2020-01-16 NOTE — Progress Notes (Signed)
Patient ID: Anne [name], female   DOB: 1956-07-16, 64 y.o.   MRN: [id number]       Chief Complaint: Patient was seen in consultation today for follow-up left great saphenous vein ablation at the request of Meda Dudzinski  Referring Physician(s): Deland Pretty  History of Present Illness: Anne [name] is a 64 y.o. female initially seen 11/07/2019, who noted left lower leg varicose veins for many years.  Over the previous 2 months, she had been having more pain in her posterior left calf in the region of the varicose veins.  No previous varicose or spider vein treatment.  No history of venous ulceration, DVT, superficial thrombophlebitis, or pulmonary embolism.  There is a history of varicose veins in her mother.  She occasionally used support hose with some relief of symptoms.  Her job  continues to require standing 6 to 8 hours a day.  She used Tylenol for pain control.  She felt that leg elevation improves her symptoms.  She is diabetic.  She does not have any symptoms in the right lower extremity.  She underwent successful ultrasound-guided endovascular laser ablation of the refluxing left great saphenous vein 12/03/2019.  She did well post procedure, follow-up ultrasound 12/10/2019 showed closure of the treated segment with some residual patent left proximal calf varicosities.  She returns today for her scheduled 1 month follow-up.  Her symptoms remain improved.  She is using her compression hose intermittently.  She still notices some prominent proximal posterior medial varicose veins in her calf.  Right leg remains asymptomatic.    Past Medical History:  Diagnosis Date   Anxiety    Arthritis    Cataract    hx of   Chronic low back pain    Deviated septum    Dyslipidemia    GERD (gastroesophageal reflux disease)    occ   History of hiatal hernia    Hypertension    Legally blind    Right eye   Major depression    Migraines    Morbid obesity ([place])    Pneumonia     Past  Surgical History:  Procedure Laterality Date   CATARACT EXTRACTION, BILATERAL     CHOLECYSTECTOMY     COLONOSCOPY WITH PROPOFOL N/A 11/12/2013   Procedure: COLONOSCOPY WITH PROPOFOL;  Surgeon: Garlan Fair, MD;  Location: [facility];  Service: Endoscopy;  Laterality: N/A;   IR EMBO VENOUS NOT HEMORR HEMANG  INC GUIDE ROADMAPPING  12/03/2019   lower back surgery     RETINAL DETACHMENT SURGERY Right    x2   right arm tendon repair  2005   ROTATOR CUFF REPAIR Bilateral    both sholulders   TOTAL KNEE ARTHROPLASTY Left 12/26/2016   Procedure: LEFT TOTAL KNEE ARTHROPLASTY;  Surgeon: Gaynelle Arabian, MD;  Location: [facility];  Service: Orthopedics;  Laterality: Left;  requests 36mins   TOTAL KNEE ARTHROPLASTY Right 11/05/2018   Procedure: TOTAL KNEE ARTHROPLASTY;  Surgeon: Gaynelle Arabian, MD;  Location: [facility];  Service: Orthopedics;  Laterality: Right;  51min    Allergies: Patient has no known allergies.  Medications: Prior to Admission medications   Medication Sig Start Date End Date Taking? Authorizing Provider  ALPRAZolam Duanne Moron) 1 MG tablet Take 1 mg by mouth at bedtime as needed for sleep.    [provider]  atorvastatin (LIPITOR) 20 MG tablet Take 20 mg by mouth at bedtime. 12/05/16   [provider]  benzonatate (TESSALON) 100 MG capsule Take 1 capsule (100 mg total)  by mouth every 8 (eight) hours. Patient taking differently: Take 100 mg by mouth every 8 (eight) hours as needed for cough.  10/03/18   Bast, Tressia Miners A, NP  BLACK CURRANT SEED OIL PO Take 1,250 mg by mouth daily.    [provider]  buPROPion (WELLBUTRIN XL) 150 MG 24 hr tablet Take 150 mg by mouth daily.    [provider]  calcium carbonate (TUMS - DOSED IN MG ELEMENTAL CALCIUM) 500 MG chewable tablet Chew 1-2 tablets by mouth daily as needed for indigestion or heartburn.    [provider]  Calcium Carbonate-Vitamin D (CALTRATE 600+D PO) Take 1 tablet by mouth daily.     [provider]  Carboxymethylcell-Hypromellose (GENTEAL OP) Apply 1 drop to eye 3 (three) times daily as needed (dry eyes).    [provider]  Feverfew 380 MG CAPS Take 380 mg by mouth daily.    [provider]  gabapentin (NEURONTIN) 300 MG capsule Take 1 capsule (300 mg total) by mouth 3 (three) times daily. Take a 300 mg capsule three times a day for two weeks following surgery.Then take a 300 mg capsule two times a day for two weeks. Then take a 300 mg capsule once a day for two weeks. Then discontinue. 11/06/18   Edmisten, Kristie L, PA  GARCINIA CAMBOGIA-CHROMIUM PO Take 2,100 mg by mouth 3 (three) times daily.    [provider]  methocarbamol (ROBAXIN) 500 MG tablet Take 1 tablet (500 mg total) by mouth every 6 (six) hours as needed for muscle spasms. 11/06/18   Edmisten, Ok Anis, PA  Multiple Vitamin (MULTIVITAMIN WITH MINERALS) TABS tablet Take 1 tablet by mouth daily.    [provider]  Omega-3 Fatty Acids (FISH OIL PO) Take 720 mg by mouth daily.    [provider]  OVER THE COUNTER MEDICATION Take 1 tablet by mouth 3 (three) times a week. Butterbur otc supplement    [provider]  oxyCODONE (OXY IR/ROXICODONE) 5 MG immediate release tablet Take 1-2 tablets (5-10 mg total) by mouth every 6 (six) hours as needed for severe pain. 11/06/18   Edmisten, Kristie L, PA  potassium chloride SA (K-DUR,KLOR-CON) 20 MEQ tablet Take 20 mEq by mouth at bedtime.  12/12/16   [provider]  sodium chloride (OCEAN) 0.65 % SOLN nasal spray Place 1 spray into both nostrils as needed for congestion.    [provider]  traMADol (ULTRAM) 50 MG tablet Take 1-2 tablets (50-100 mg total) by mouth every 6 (six) hours as needed for moderate pain. 11/06/18   Edmisten, Kristie L, PA  traZODone (DESYREL) 150 MG tablet Take 150 mg by mouth at bedtime as needed for sleep.    [provider]  triamterene-hydrochlorothiazide (MAXZIDE)  75-50 MG per tablet Take 0.5 tablets by mouth daily.     [provider]  vitamin C (ASCORBIC ACID) 250 MG tablet Take 500 mg by mouth daily.    [provider]     Family History  Problem Relation Age of Onset   Diabetes Mother    Hypertension Mother    Heart attack Father    Cancer Father        lung    Social History   Socioeconomic History   Marital status: Single    Spouse name: Not on file   Number of children: Not on file   Years of education: Not on file   Highest education level: Not on file  Occupational  History   Not on file  Tobacco Use   Smoking status: Never Smoker   Smokeless tobacco: Never Used  Substance and Sexual Activity   Alcohol use: Yes    Comment: very rare use   Drug use: No   Sexual activity: Yes    Birth control/protection: Post-menopausal  Other Topics Concern   Not on file  Social History Narrative   Not on file   Social Determinants of Health   Financial Resource Strain:    Difficulty of Paying Living Expenses:   Food Insecurity:    Worried About Charity fundraiser in the Last Year:    Arboriculturist in the Last Year:   Transportation Needs:    Film/video editor (Medical):    Lack of Transportation (Non-Medical):   Physical Activity:    Days of Exercise per Week:    Minutes of Exercise per Session:   Stress:    Feeling of Stress :   Social Connections:    Frequency of Communication with Friends and Family:    Frequency of Social Gatherings with Friends and Family:    Attends Religious Services:    Active Member of Clubs or Organizations:    Attends Music therapist:    Marital Status:      Vital Signs: LMP  (LMP Unknown)   Physical Exam ECOG Status: 1 - Symptomatic but completely ambulatory Constitutional: Oriented to person, place, and time. Well-developed and well-nourished. No distress.   HENT:  Head: Normocephalic and atraumatic.  Eyes:  Conjunctivae and EOM are normal. Right eye exhibits no discharge. Left eye exhibits no discharge. No scleral icterus.  Neck: No JVD present.  Pulmonary/Chest: Effort normal. No stridor. No respiratory distress.  Abdomen: soft, non distended Neurological:  alert and oriented to person, place, and time.  Skin: Skin is warm and dry.  not diaphoretic.  Skin entry site is completely healed on the calf.  Psychiatric:   normal mood and affect.   behavior is normal. Judgment and thought content normal.   Review of Systems  Review of Systems: A 12 point ROS discussed and pertinent positives are indicated in the HPI above.  All other systems are negative.  Mallampati Score:     Imaging: US Venous Img Lower Unilateral Left (DVT)  Result Date: 01/16/2020 CLINICAL DATA:  Symptomatic left leg varicose veins, now 1 month status post transcatheter laser occlusion of the greater saphenous vein. TECHNIQUE: LEFT LOWER EXTREMITY VENOUS DOPPLER ULTRASOUND Gray-scale sonography with compression as well as color and duplex Doppler ultrasound were performed to evaluate the saphenous vein and the deep venous system from the level of the common femoral vein through the popliteal and proximal calf veins. FINDINGS: The lower extremity deep venous system demonstrates normal compressibility, phasicity, and augmentation. Posterior tibial vein unremarkable. No evidence of DVT. The greater saphenous vein is occluded throughout the treatment segment. there are patent subcutaneous varicose veins in the posteromedial proximal calf, slightly decreased in caliber since preprocedure imaging. IMPRESSION: 1. Technically successful occlusion of the greater saphenous vein along the treatment length without apparent complication. 2. Normal deep venous system. No DVT. 3. persistent patency of varicose veins in the proximal calf Electronically Signed   By: Lucrezia Europe M.D.   On: 01/16/2020 09:10   [place] Injec Sclerotherapy Mult  Result Date:  01/16/2020 INDICATION: Symptomatic left lower extremity varicose veins, status post ablation of refluxing great saphenous vein. Persistent symptomatic posteromedial proximal calf varicose veins. EXAM: ULTRASOUND INJECTION OF  SCLEROSANT; MULTIPLE INCOMPETENT VEINS (SAME LEG) TECHNIQUE: Survey ultrasound of the left lower extremity was performed and appropriate skin entry sites were marked. Sites were prepped with chlorhexidine and under direct ultrasound guidance a foam of 2 ml sodium tetradecyl sulfate was injected in small aliquots into the residual patent symptomatic varicose veins until there was good filling of the affected regions. The patient tolerated the procedure well, with no immediate complication. COMPLICATIONS: None immediate. COMPARISON:  None IMPRESSION: 1. Technically successful foam sclerotherapy of residual symptomatic left lower extremity varicose veins. Follow-up PRN. Electronically Signed   By: Lucrezia Europe M.D.   On: 01/16/2020 10:03   [place] RAD EVAL AND MGMT  Result Date: 01/16/2020 Please refer to "Notes" to see consult details.   Labs:  CBC: No results for input(s): WBC, HGB, HCT, PLT in the last 8760 hours.  COAGS: No results for input(s): INR, APTT in the last 8760 hours.  BMP: No results for input(s): NA, K, CL, CO2, GLUCOSE, BUN, CALCIUM, CREATININE, GFRNONAA, GFRAA in the last 8760 hours.  Invalid input(s): CMP  LIVER FUNCTION TESTS: No results for input(s): BILITOT, AST, ALT, ALKPHOS, PROT, ALBUMIN in the last 8760 hours.  TUMOR MARKERS: No results for input(s): AFPTM, CEA, CA199, CHROMGRNA in the last 8760 hours.  Assessment and Plan: My impression is that the patient done very well post ablation of the refluxing left great saphenous vein with significant improvement in symptoms.  No side effects or complications.  Although her proximal calf varicose veins have decreased in size the remain somewhat symptomatic.  Therefore, we proceeded with ultrasound-guided  direct injection foam sclerotherapy, without immediate complication. We discussed the expected time course of resolution of symptoms.  She will continue to wear her compression hose when up and about. She knows to call with any interval questions or problems. We will see her back at the 94-month mark to make sure things have resolved completely.  Thank you for this interesting consult.  I greatly enjoyed meeting Anne [name] and look forward to participating in their care.  A copy of this report was sent to the requesting provider on this date.  Electronically Signed: Rickard Rhymes 01/16/2020, 12:29 PM   I spent a total of    25 Minutes in face to face in clinical consultation, greater than 50% of which was counseling/coordinating care for symptomatic left lower extremity varicose veins.

## 2020-04-15 DIAGNOSIS — E7800 Pure hypercholesterolemia, unspecified: Secondary | ICD-10-CM | POA: Diagnosis not present

## 2020-04-15 DIAGNOSIS — R739 Hyperglycemia, unspecified: Secondary | ICD-10-CM | POA: Diagnosis not present

## 2020-04-15 DIAGNOSIS — Z0000 Encounter for general adult medical examination without abnormal findings: Secondary | ICD-10-CM | POA: Diagnosis not present

## 2020-04-15 DIAGNOSIS — I10 Essential (primary) hypertension: Secondary | ICD-10-CM | POA: Diagnosis not present

## 2020-04-20 DIAGNOSIS — Z0000 Encounter for general adult medical examination without abnormal findings: Secondary | ICD-10-CM | POA: Diagnosis not present

## 2020-05-20 ENCOUNTER — Other Ambulatory Visit: Payer: Self-pay | Admitting: Interventional Radiology

## 2020-05-20 DIAGNOSIS — I872 Venous insufficiency (chronic) (peripheral): Secondary | ICD-10-CM

## 2020-05-20 DIAGNOSIS — I83812 Varicose veins of left lower extremities with pain: Secondary | ICD-10-CM | POA: Diagnosis not present

## 2020-05-29 DIAGNOSIS — Z20822 Contact with and (suspected) exposure to covid-19: Secondary | ICD-10-CM | POA: Diagnosis not present

## 2020-05-29 DIAGNOSIS — Z03818 Encounter for observation for suspected exposure to other biological agents ruled out: Secondary | ICD-10-CM | POA: Diagnosis not present

## 2020-07-06 IMAGING — DX DG CHEST 2V
2 series · 2 of 2 positions shown · non-contrast
Comparison: Chest radiograph [DATE]

CLINICAL DATA: Patient cough for 2 weeks.

EXAM:
CHEST - 2 VIEW

[chest pa]
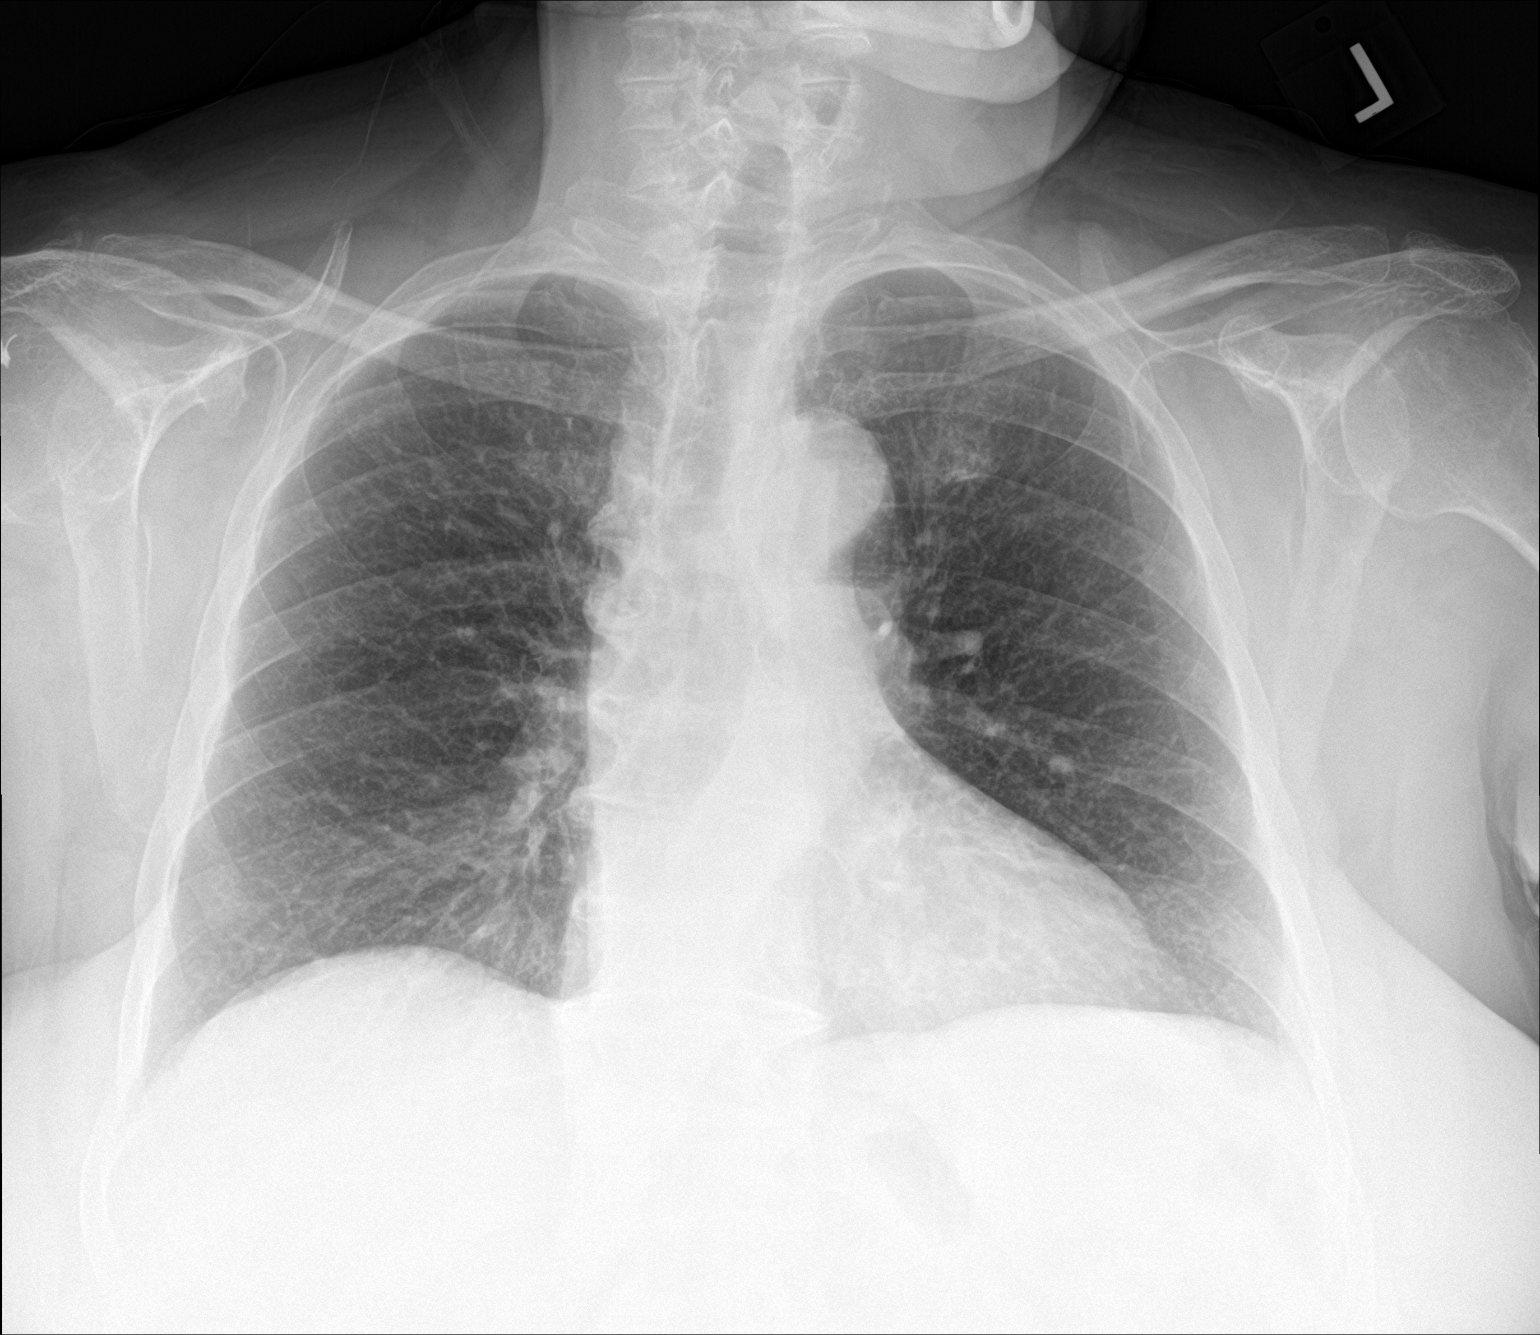

[chest lat]
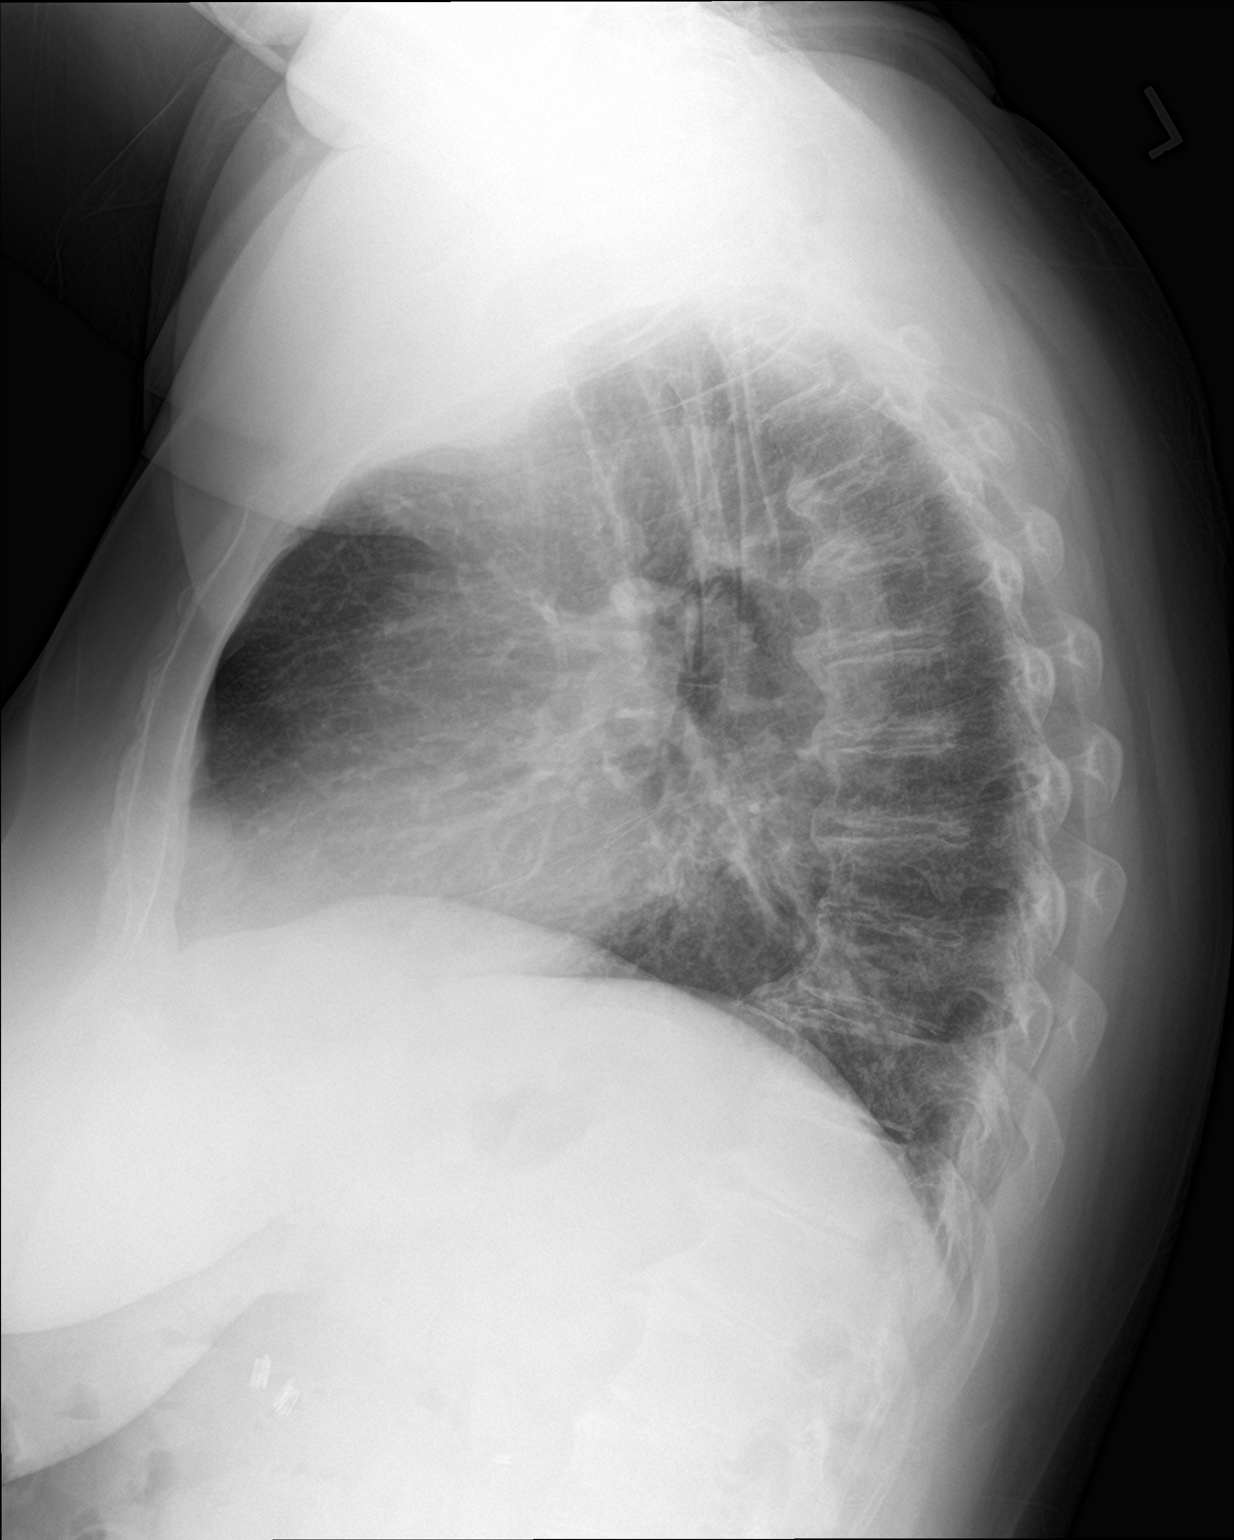

[2 of 2 positions shown; findings below may reference images not displayed]

FINDINGS: Cardiac contours upper limits of normal. No consolidative pulmonary
opacities. No pleural effusion or pneumothorax. Thoracic spine
degenerative changes.
IMPRESSION: No acute cardiopulmonary process.

## 2020-08-05 ENCOUNTER — Other Ambulatory Visit: Payer: Self-pay | Admitting: Internal Medicine

## 2020-08-05 DIAGNOSIS — Z1231 Encounter for screening mammogram for malignant neoplasm of breast: Secondary | ICD-10-CM

## 2020-08-18 DIAGNOSIS — Z961 Presence of intraocular lens: Secondary | ICD-10-CM | POA: Diagnosis not present

## 2020-08-18 DIAGNOSIS — H53001 Unspecified amblyopia, right eye: Secondary | ICD-10-CM | POA: Diagnosis not present

## 2020-08-18 DIAGNOSIS — H52203 Unspecified astigmatism, bilateral: Secondary | ICD-10-CM | POA: Diagnosis not present

## 2020-09-10 ENCOUNTER — Other Ambulatory Visit: Payer: Self-pay

## 2020-09-10 ENCOUNTER — Ambulatory Visit
Admission: RE | Admit: 2020-09-10 | Discharge: 2020-09-10 | Disposition: A | Discharge status: Home / Self Care | Payer: BC Managed Care – PPO | Source: Ambulatory Visit | Attending: Internal Medicine | Admitting: Internal Medicine

## 2020-09-10 DIAGNOSIS — Z1231 Encounter for screening mammogram for malignant neoplasm of breast: Secondary | ICD-10-CM

## 2020-09-11 ENCOUNTER — Ambulatory Visit: Payer: BC Managed Care – PPO

## 2020-09-15 ENCOUNTER — Ambulatory Visit
Admission: RE | Admit: 2020-09-15 | Discharge: 2020-09-15 | Disposition: A | Discharge status: Home / Self Care | Payer: BC Managed Care – PPO | Source: Ambulatory Visit | Attending: Interventional Radiology | Admitting: Interventional Radiology

## 2020-09-15 DIAGNOSIS — I872 Venous insufficiency (chronic) (peripheral): Secondary | ICD-10-CM

## 2020-09-15 DIAGNOSIS — I83812 Varicose veins of left lower extremities with pain: Secondary | ICD-10-CM | POA: Diagnosis not present

## 2020-09-15 DIAGNOSIS — Z9889 Other specified postprocedural states: Secondary | ICD-10-CM | POA: Diagnosis not present

## 2020-09-15 DIAGNOSIS — Z8679 Personal history of other diseases of the circulatory system: Secondary | ICD-10-CM | POA: Diagnosis not present

## 2020-09-15 DIAGNOSIS — I82812 Embolism and thrombosis of superficial veins of left lower extremities: Secondary | ICD-10-CM | POA: Diagnosis not present

## 2020-09-15 NOTE — Progress Notes (Signed)
Patient ID: Anne [name], female   DOB: 1955-10-30, 64 y.o.   MRN: [id number]       Chief Complaint: Patient was seen in consultation today for left lower extremity varicose veins, post endovascular laser ablation and ultrasound-guided therapy sclerotherapy at the request of Suhailah Kwan  Referring Physician(s): Pharr,Walter    History of Present Illness: Anne [name] is a 64 y.o. female who presented to our service 11/07/2019  complaining of symptomatic left lower extremity varicose veins for many years, worsening.  No relief from support hose.  She is found to have extensive valvular incompetence and reflux in the left GSV with direct medication to symptomatic varicose veins 12/03/2019 underwent uncomplicated transcatheter laser occlusion of left great saphenous vein under ultrasound guidance 04/09/5884 no complication on follow-up scheduled ultrasound, although continued patency of calf varicosities was noted 01/16/2020 continued patency of calf varicosities, subsequently treated with ultrasound-guided sclerotherapy  She has done well since that time.  Her varicose veins have involuted.  Her symptoms have resolved.  She is not using the compression hose faithfully these days.  She does have intermittent bilateral groin pain which is new, of uncertain significance to previous vein treatment. Past Medical History:  Diagnosis Date  . Anxiety   . Arthritis   . Cataract    hx of  . Chronic low back pain   . Deviated septum   . Dyslipidemia   . GERD (gastroesophageal reflux disease)    occ  . History of hiatal hernia   . Hypertension   . Legally blind    Right eye  . Major depression   . Migraines   . Morbid obesity ([place])   . Pneumonia     Past Surgical History:  Procedure Laterality Date  . CATARACT EXTRACTION, BILATERAL    . CHOLECYSTECTOMY    . COLONOSCOPY WITH PROPOFOL N/A 11/12/2013   Procedure: COLONOSCOPY WITH PROPOFOL;  Surgeon: Garlan Fair, MD;  Location: [facility];   Service: Endoscopy;  Laterality: N/A;  . IR EMBO VENOUS NOT HEMORR HEMANG  INC GUIDE ROADMAPPING  12/03/2019  . lower back surgery    . RETINAL DETACHMENT SURGERY Right    x2  . right arm tendon repair  2005  . ROTATOR CUFF REPAIR Bilateral    both sholulders  . TOTAL KNEE ARTHROPLASTY Left 12/26/2016   Procedure: LEFT TOTAL KNEE ARTHROPLASTY;  Surgeon: Gaynelle Arabian, MD;  Location: [facility];  Service: Orthopedics;  Laterality: Left;  requests 55mins  . TOTAL KNEE ARTHROPLASTY Right 11/05/2018   Procedure: TOTAL KNEE ARTHROPLASTY;  Surgeon: Gaynelle Arabian, MD;  Location: [facility];  Service: Orthopedics;  Laterality: Right;  23min    Allergies: Patient has no known allergies.  Medications: Prior to Admission medications   Medication Sig Start Date End Date Taking? Authorizing Provider  ALPRAZolam Duanne Moron) 1 MG tablet Take 1 mg by mouth at bedtime as needed for sleep.    [provider]  atorvastatin (LIPITOR) 20 MG tablet Take 20 mg by mouth at bedtime. 12/05/16   [provider]  benzonatate (TESSALON) 100 MG capsule Take 1 capsule (100 mg total) by mouth every 8 (eight) hours. Patient taking differently: Take 100 mg by mouth every 8 (eight) hours as needed for cough.  10/03/18   Bast, Tressia Miners A, NP  BLACK CURRANT SEED OIL PO Take 1,250 mg by mouth daily.    [provider]  buPROPion (WELLBUTRIN XL) 150 MG 24 hr tablet Take 150 mg by mouth daily.  [provider]  calcium carbonate (TUMS - DOSED IN MG ELEMENTAL CALCIUM) 500 MG chewable tablet Chew 1-2 tablets by mouth daily as needed for indigestion or heartburn.    [provider]  Calcium Carbonate-Vitamin D (CALTRATE 600+D PO) Take 1 tablet by mouth daily.    [provider]  Carboxymethylcell-Hypromellose (GENTEAL OP) Apply 1 drop to eye 3 (three) times daily as needed (dry eyes).    [provider]  Feverfew 380 MG CAPS Take 380 mg by mouth daily.    [provider]   gabapentin (NEURONTIN) 300 MG capsule Take 1 capsule (300 mg total) by mouth 3 (three) times daily. Take a 300 mg capsule three times a day for two weeks following surgery.Then take a 300 mg capsule two times a day for two weeks. Then take a 300 mg capsule once a day for two weeks. Then discontinue. 11/06/18   Edmisten, Kristie L, PA  GARCINIA CAMBOGIA-CHROMIUM PO Take 2,100 mg by mouth 3 (three) times daily.    [provider]  methocarbamol (ROBAXIN) 500 MG tablet Take 1 tablet (500 mg total) by mouth every 6 (six) hours as needed for muscle spasms. 11/06/18   Edmisten, Ok Anis, PA  Multiple Vitamin (MULTIVITAMIN WITH MINERALS) TABS tablet Take 1 tablet by mouth daily.    [provider]  Omega-3 Fatty Acids (FISH OIL PO) Take 720 mg by mouth daily.    [provider]  OVER THE COUNTER MEDICATION Take 1 tablet by mouth 3 (three) times a week. Butterbur otc supplement    [provider]  oxyCODONE (OXY IR/ROXICODONE) 5 MG immediate release tablet Take 1-2 tablets (5-10 mg total) by mouth every 6 (six) hours as needed for severe pain. 11/06/18   Edmisten, Kristie L, PA  potassium chloride SA (K-DUR,KLOR-CON) 20 MEQ tablet Take 20 mEq by mouth at bedtime.  12/12/16   [provider]  sodium chloride (OCEAN) 0.65 % SOLN nasal spray Place 1 spray into both nostrils as needed for congestion.    [provider]  traMADol (ULTRAM) 50 MG tablet Take 1-2 tablets (50-100 mg total) by mouth every 6 (six) hours as needed for moderate pain. 11/06/18   Edmisten, Kristie L, PA  traZODone (DESYREL) 150 MG tablet Take 150 mg by mouth at bedtime as needed for sleep.    [provider]  triamterene-hydrochlorothiazide (MAXZIDE) 75-50 MG per tablet Take 0.5 tablets by mouth daily.     [provider]  vitamin C (ASCORBIC ACID) 250 MG tablet Take 500 mg by mouth daily.    [provider]     Family History  Problem Relation Age of Onset  .  Diabetes Mother   . Hypertension Mother   . Heart attack Father   . Cancer Father        lung    Social History   Socioeconomic History  . Marital status: Single    Spouse name: Not on file  . Number of children: Not on file  . Years of education: Not on file  . Highest education level: Not on file  Occupational History  . Not on file  Tobacco Use  . Smoking status: Never Smoker  . Smokeless tobacco: Never Used  Vaping Use  . Vaping Use: Never used  Substance and Sexual Activity  . Alcohol use: Yes    Comment: very rare use  . Drug use: No  . Sexual activity: Yes    Birth control/protection: Post-menopausal  Other Topics  Concern  . Not on file  Social History Narrative  . Not on file   Social Determinants of Health   Financial Resource Strain: Not on file  Food Insecurity: Not on file  Transportation Needs: Not on file  Physical Activity: Not on file  Stress: Not on file  Social Connections: Not on file    ECOG Status: 0 - Asymptomatic  Review of Systems: A 12 point ROS discussed and pertinent positives are indicated in the HPI above.  All other systems are negative.  Review of Systems  Vital Signs: LMP  (LMP Unknown)   Physical Exam  Mallampati Score:     Imaging: LEFT LOWER EXTREMITY VENOUS DUPLEX ULTRASOUND  TECHNIQUE: Gray-scale sonography with graded compression, as well as color Doppler and duplex ultrasound, were performed to evaluate the deep and superficial veins of left lower extremity. Spectral Doppler was utilized to evaluate flow at rest and with distal augmentation maneuvers. A complete superficial venous insufficiency exam was performed in the upright standing position. I personally performed the technical portion of the exam.  COMPARISON: 01/16/2020 and previous  FINDINGS: Deep Venous System:  Evaluation of the deep venous system including the common femoral, femoral, profunda femoral, popliteal and calf veins (where  visible) demonstrate no evidence of deep venous thrombosis. The vessels are compressible and demonstrate normal respiratory phasicity and response to augmentation. No evidence of the deep venous reflux. Survey image of the right common femoral vein unremarkable.  Superficial Venous System:  LEFT  SFJ: Continued patency  GSV Prox Thigh: Patent, normal in caliber  GSV Mid Thigh: Continued occlusion  GSV Lower Thigh: Continued occlusion  GSV at Knee: Compressible, normal in caliber  GSV Prox Calf: Partially compressible, normal in caliber  GSV Mid Calf: Continued occlusion  GSV Distal Calf: Occluded  SSV : Not evaluated  No visible left lower extremity varicose veins.  Other: None  IMPRESSION: 1. Durable occlusion of the left GSV along the treatment length without apparent complication. 2. Normal deep venous system. No acute or chronic DVT.     Labs:  CBC: No results for input(s): WBC, HGB, HCT, PLT in the last 8760 hours.  COAGS: No results for input(s): INR, APTT in the last 8760 hours.  BMP: No results for input(s): NA, K, CL, CO2, GLUCOSE, BUN, CALCIUM, CREATININE, GFRNONAA, GFRAA in the last 8760 hours.  Invalid input(s): CMP  LIVER FUNCTION TESTS: No results for input(s): BILITOT, AST, ALT, ALKPHOS, PROT, ALBUMIN in the last 8760 hours.  TUMOR MARKERS: No results for input(s): AFPTM, CEA, CA199, CHROMGRNA in the last 8760 hours.  Assessment and Plan:  My impression is that patient is doing very well after transcatheter laser ablation of refluxing left great saphenous vein and subsequent ultrasound-guided sclerotherapy of calf varicose veins.  Symptomatic Varicose veins have involuted.  She is no longer symptomatic.  Today's imaging shows no new venous pathology or unexpected findings.  We discussed the anticipated long-term durable success of the treatment.  I do encourage continued compression hose use when up and about as she is able.  We do not  need to see her at scheduled follow-up, but she knows to phone should she have any concerns with regards to the previous treatment or new lower extremity venous issues.  Thank you for this interesting consult.  I greatly enjoyed meeting Jonaya A Mikels and look forward to participating in their care.  A copy of this report was sent to the requesting provider on this date.  Electronically Signed:  Rickard Rhymes 09/15/2020, 11:14 AM   I spent a total of    25 Minutes in face to face in clinical consultation, greater than 50% of which was counseling/coordinating care for left lower extremity varicose veins, post ablation and sclerotherapy.

## 2020-10-22 DIAGNOSIS — E7800 Pure hypercholesterolemia, unspecified: Secondary | ICD-10-CM | POA: Diagnosis not present

## 2020-10-27 DIAGNOSIS — I10 Essential (primary) hypertension: Secondary | ICD-10-CM | POA: Diagnosis not present

## 2020-10-27 DIAGNOSIS — E7800 Pure hypercholesterolemia, unspecified: Secondary | ICD-10-CM | POA: Diagnosis not present

## 2020-10-27 DIAGNOSIS — F419 Anxiety disorder, unspecified: Secondary | ICD-10-CM | POA: Diagnosis not present

## 2021-03-15 ENCOUNTER — Ambulatory Visit
Admission: EM | Admit: 2021-03-15 | Discharge: 2021-03-15 | Disposition: A | Discharge status: Home / Self Care | Payer: BC Managed Care – PPO | Attending: Student | Admitting: Student

## 2021-03-15 ENCOUNTER — Other Ambulatory Visit: Payer: Self-pay

## 2021-03-15 DIAGNOSIS — J208 Acute bronchitis due to other specified organisms: Secondary | ICD-10-CM

## 2021-03-15 DIAGNOSIS — J069 Acute upper respiratory infection, unspecified: Secondary | ICD-10-CM

## 2021-03-15 MED ORDER — ALBUTEROL SULFATE HFA 108 (90 BASE) MCG/ACT IN AERS: 1.0000 | INHALATION_SPRAY | Freq: Four times a day (QID) | RESPIRATORY_TRACT | 0 refills | Status: DC | PRN

## 2021-03-15 MED ORDER — PREDNISONE 50 MG PO TABS: 50.0000 mg | ORAL_TABLET | Freq: Every day | ORAL | 0 refills | Status: AC

## 2021-03-15 NOTE — Discharge Instructions (Addendum)
-  Prednisone 1 pill taken in the morning for 5 days in a row.  This medication can give you energy, so take earlier in the day. -Albuterol inhaler as needed for wheezing, cough, shortness of breath. -Seek additional medical attention if he has new symptoms like shortness of breath, chest pain, dizziness.

## 2021-03-15 NOTE — ED Triage Notes (Addendum)
Patient presents to Urgent Care with complaints of cough/congestion since last Tuesday. Pt states cough is worsened. Treating with herbal remedies and tessalon with no relief. Pt reports last Negative covid today.  Denies fever.

## 2021-03-15 NOTE — ED Provider Notes (Signed)
[name]    CSN: [id number] Arrival date & time: 03/15/21  6295      History   Chief Complaint Chief Complaint  Patient presents with   Sore Throat   Cough   Nasal Congestion    HPI Anne [name] is a 65 y.o. female presenting with cough and congestion x1 week.  Medical history chronic back pain, GERD, hyperlipidemia, hypertension, migraines, morbid obesity, pneumonia.  Notes 1 week of cough, nasal congestion.  Cough is productive of yellow-green sputum.  States cough is getting worse, is hacking and keeps her up at night.  Denies shortness of breath, fever/chills. Negative home covid test. Denies fevers/chills, n/v/d, shortness of breath, chest pain, facial pain, teeth pain, headaches, sore throat, loss of taste/smell, swollen lymph nodes, ear pain.    HPI  Past Medical History:  Diagnosis Date   Anxiety    Arthritis    Cataract    hx of   Chronic low back pain    Deviated septum    Dyslipidemia    GERD (gastroesophageal reflux disease)    occ   History of hiatal hernia    Hypertension    Legally blind    Right eye   Major depression    Migraines    Morbid obesity ([place])    Pneumonia     Patient Active Problem List   Diagnosis Date Noted   OA (osteoarthritis) of knee 12/26/2016    Past Surgical History:  Procedure Laterality Date   CATARACT EXTRACTION, BILATERAL     CHOLECYSTECTOMY     COLONOSCOPY WITH PROPOFOL N/A 11/12/2013   Procedure: COLONOSCOPY WITH PROPOFOL;  Surgeon: Garlan Fair, MD;  Location: [facility];  Service: Endoscopy;  Laterality: N/A;   IR EMBO VENOUS NOT HEMORR HEMANG  INC GUIDE ROADMAPPING  12/03/2019   lower back surgery     RETINAL DETACHMENT SURGERY Right    x2   right arm tendon repair  2005   ROTATOR CUFF REPAIR Bilateral    both sholulders   TOTAL KNEE ARTHROPLASTY Left 12/26/2016   Procedure: LEFT TOTAL KNEE ARTHROPLASTY;  Surgeon: Gaynelle Arabian, MD;  Location: [facility];  Service: Orthopedics;  Laterality: Left;   requests 83mins   TOTAL KNEE ARTHROPLASTY Right 11/05/2018   Procedure: TOTAL KNEE ARTHROPLASTY;  Surgeon: Gaynelle Arabian, MD;  Location: [facility];  Service: Orthopedics;  Laterality: Right;  75min    OB History     Gravida  0   Para      Term      Preterm      AB      Living         SAB      IAB      Ectopic      Multiple      Live Births               Home Medications    Prior to Admission medications   Medication Sig Start Date End Date Taking? Authorizing Provider  albuterol (VENTOLIN HFA) 108 (90 Base) MCG/ACT inhaler Inhale 1-2 puffs into the lungs every 6 (six) hours as needed for wheezing or shortness of breath. 03/15/21  Yes Hazel Sams, PA-C  predniSONE (DELTASONE) 50 MG tablet Take 1 tablet (50 mg total) by mouth daily for 5 days. 03/15/21 03/20/21 Yes Hazel Sams, PA-C  ALPRAZolam Duanne Moron) 1 MG tablet Take 1 mg by mouth at bedtime as needed for sleep.    [provider]  atorvastatin (LIPITOR) 20 MG tablet Take 20 mg by mouth at bedtime. 12/05/16   [provider]  benzonatate (TESSALON) 100 MG capsule Take 1 capsule (100 mg total) by mouth every 8 (eight) hours. Patient taking differently: Take 100 mg by mouth every 8 (eight) hours as needed for cough.  10/03/18   Bast, Tressia Miners A, NP  BLACK CURRANT SEED OIL PO Take 1,250 mg by mouth daily.    [provider]  buPROPion (WELLBUTRIN XL) 150 MG 24 hr tablet Take 150 mg by mouth daily.    [provider]  calcium carbonate (TUMS - DOSED IN MG ELEMENTAL CALCIUM) 500 MG chewable tablet Chew 1-2 tablets by mouth daily as needed for indigestion or heartburn.    [provider]  Calcium Carbonate-Vitamin D (CALTRATE 600+D PO) Take 1 tablet by mouth daily.    [provider]  Carboxymethylcell-Hypromellose (GENTEAL OP) Apply 1 drop to eye 3 (three) times daily as needed (dry eyes).    [provider]  Feverfew 380 MG CAPS Take 380 mg by mouth daily.     [provider]  gabapentin (NEURONTIN) 300 MG capsule Take 1 capsule (300 mg total) by mouth 3 (three) times daily. Take a 300 mg capsule three times a day for two weeks following surgery.Then take a 300 mg capsule two times a day for two weeks. Then take a 300 mg capsule once a day for two weeks. Then discontinue. 11/06/18   Edmisten, Kristie L, PA  GARCINIA CAMBOGIA-CHROMIUM PO Take 2,100 mg by mouth 3 (three) times daily.    [provider]  methocarbamol (ROBAXIN) 500 MG tablet Take 1 tablet (500 mg total) by mouth every 6 (six) hours as needed for muscle spasms. 11/06/18   Edmisten, Ok Anis, PA  Multiple Vitamin (MULTIVITAMIN WITH MINERALS) TABS tablet Take 1 tablet by mouth daily.    [provider]  Omega-3 Fatty Acids (FISH OIL PO) Take 720 mg by mouth daily.    [provider]  OVER THE COUNTER MEDICATION Take 1 tablet by mouth 3 (three) times a week. Butterbur otc supplement    [provider]  oxyCODONE (OXY IR/ROXICODONE) 5 MG immediate release tablet Take 1-2 tablets (5-10 mg total) by mouth every 6 (six) hours as needed for severe pain. 11/06/18   Edmisten, Kristie L, PA  potassium chloride SA (K-DUR,KLOR-CON) 20 MEQ tablet Take 20 mEq by mouth at bedtime.  12/12/16   [provider]  sodium chloride (OCEAN) 0.65 % SOLN nasal spray Place 1 spray into both nostrils as needed for congestion.    [provider]  traMADol (ULTRAM) 50 MG tablet Take 1-2 tablets (50-100 mg total) by mouth every 6 (six) hours as needed for moderate pain. 11/06/18   Edmisten, Kristie L, PA  traZODone (DESYREL) 150 MG tablet Take 150 mg by mouth at bedtime as needed for sleep.    [provider]  triamterene-hydrochlorothiazide (MAXZIDE) 75-50 MG per tablet Take 0.5 tablets by mouth daily.     [provider]  vitamin C (ASCORBIC ACID) 250 MG tablet Take 500 mg by mouth daily.    [provider]    Family History Family History   Problem Relation Age of Onset   Diabetes Mother    Hypertension Mother    Heart attack Father    Cancer Father        lung    Social History Social History   Tobacco Use   Smoking status: Never  Smokeless tobacco: Never  Vaping Use   Vaping Use: Never used  Substance Use Topics   Alcohol use: Yes    Comment: very rare use   Drug use: No     Allergies   Patient has no known allergies.   Review of Systems Review of Systems  Constitutional:  Negative for appetite change, chills and fever.  HENT:  Positive for congestion. Negative for ear pain, rhinorrhea, sinus pressure, sinus pain and sore throat.   Eyes:  Negative for redness and visual disturbance.  Respiratory:  Positive for cough. Negative for chest tightness, shortness of breath and wheezing.   Cardiovascular:  Negative for chest pain and palpitations.  Gastrointestinal:  Negative for abdominal pain, constipation, diarrhea, nausea and vomiting.  Genitourinary:  Negative for dysuria, frequency and urgency.  Musculoskeletal:  Negative for myalgias.  Neurological:  Negative for dizziness, weakness and headaches.  Psychiatric/Behavioral:  Negative for confusion.   All other systems reviewed and are negative.   Physical Exam Triage Vital Signs ED Triage Vitals [03/15/21 1023]  Enc Vitals Group     BP 133/89     Pulse Rate 65     Resp 18     Temp 97.9 F (36.6 C)     Temp Source Oral     SpO2 96 %     Weight      Height      Head Circumference      Peak Flow      Pain Score 0     Pain Loc      Pain Edu?      Excl. in [place]?    No data found.  Updated Vital Signs BP 133/89 (BP Location: Left Arm)   Pulse 65   Temp 97.9 F (36.6 C) (Oral)   Resp 18   LMP  (LMP Unknown)   SpO2 96%   Visual Acuity Right Eye Distance:   Left Eye Distance:   Bilateral Distance:    Right Eye Near:   Left Eye Near:    Bilateral Near:     Physical Exam Vitals reviewed.  Constitutional:      General: She is not  in acute distress.    Appearance: Normal appearance. She is not ill-appearing.  HENT:     Head: Normocephalic and atraumatic.     Right Ear: Hearing, tympanic membrane, ear canal and external ear normal. No swelling or tenderness. There is no impacted cerumen. No mastoid tenderness. Tympanic membrane is not perforated, erythematous, retracted or bulging.     Left Ear: Hearing, tympanic membrane, ear canal and external ear normal. No swelling or tenderness. There is no impacted cerumen. No mastoid tenderness. Tympanic membrane is not perforated, erythematous, retracted or bulging.     Nose:     Right Sinus: No maxillary sinus tenderness or frontal sinus tenderness.     Left Sinus: No maxillary sinus tenderness or frontal sinus tenderness.     Mouth/Throat:     Mouth: Mucous membranes are moist.     Pharynx: Uvula midline. No oropharyngeal exudate or posterior oropharyngeal erythema.     Tonsils: No tonsillar exudate.  Cardiovascular:     Rate and Rhythm: Normal rate and regular rhythm.     Heart sounds: Normal heart sounds.  Pulmonary:     Breath sounds: Normal breath sounds and air entry. No decreased breath sounds, wheezing, rhonchi or rales.     Comments: Frequent hacking cough Chest:     Chest wall: No tenderness.  Abdominal:     General: Abdomen is flat. Bowel sounds are normal.     Tenderness: There is no abdominal tenderness. There is no guarding or rebound.  Lymphadenopathy:     Cervical: No cervical adenopathy.  Neurological:     General: No focal deficit present.     Mental Status: She is alert and oriented to person, place, and time.  Psychiatric:        Attention and Perception: Attention and perception normal.        Mood and Affect: Mood and affect normal.        Behavior: Behavior normal. Behavior is cooperative.        Thought Content: Thought content normal.        Judgment: Judgment normal.     UC Treatments / Results  Labs (all labs ordered are listed, but  only abnormal results are displayed) Labs Reviewed - No data to display  EKG   Radiology No results found.  Procedures Procedures (including critical care time)  Medications Ordered in UC Medications - No data to display  Initial Impression / Assessment and Plan / UC Course  I have reviewed the triage vital signs and the nursing notes.  Pertinent labs & imaging results that were available during my care of the patient were reviewed by me and considered in my medical decision making (see chart for details).     This patient is a 65 year old female presenting with viral URI with cough and acute bronchitis. Today this pt is afebrile nontachycardic nontachypneic, oxygenating well on room air, no wheezes rhonchi or rales. No history pulmonary disease.   Prednisone and albuterol inhaler as below, she does not have diabetes.   ED return precautions discussed. Patient verbalizes understanding and agreement.    Final Clinical Impressions(s) / UC Diagnoses   Final diagnoses:  Viral bronchitis  Viral URI with cough     Discharge Instructions      -Prednisone 1 pill taken in the morning for 5 days in a row.  This medication can give you energy, so take earlier in the day. -Albuterol inhaler as needed for wheezing, cough, shortness of breath. -Seek additional medical attention if he has new symptoms like shortness of breath, chest pain, dizziness.     ED Prescriptions     Medication Sig Dispense Auth. Provider   predniSONE (DELTASONE) 50 MG tablet Take 1 tablet (50 mg total) by mouth daily for 5 days. 5 tablet Hazel Sams, PA-C   albuterol (VENTOLIN HFA) 108 (90 Base) MCG/ACT inhaler Inhale 1-2 puffs into the lungs every 6 (six) hours as needed for wheezing or shortness of breath. 1 each Hazel Sams, PA-C      PDMP not reviewed this encounter.   Hazel Sams, PA-C 03/15/21 1131

## 2021-03-20 ENCOUNTER — Other Ambulatory Visit: Payer: Self-pay

## 2021-03-20 ENCOUNTER — Ambulatory Visit (INDEPENDENT_AMBULATORY_CARE_PROVIDER_SITE_OTHER): Payer: BC Managed Care – PPO

## 2021-03-20 ENCOUNTER — Ambulatory Visit
Admission: EM | Admit: 2021-03-20 | Discharge: 2021-03-20 | Disposition: A | Discharge status: Home / Self Care | Payer: BC Managed Care – PPO | Attending: Physician Assistant | Admitting: Physician Assistant

## 2021-03-20 ENCOUNTER — Encounter: Payer: Self-pay | Admitting: *Deleted

## 2021-03-20 DIAGNOSIS — R059 Cough, unspecified: Secondary | ICD-10-CM | POA: Diagnosis not present

## 2021-03-20 DIAGNOSIS — J019 Acute sinusitis, unspecified: Secondary | ICD-10-CM | POA: Diagnosis not present

## 2021-03-20 MED ORDER — BENZONATATE 100 MG PO CAPS: 100.0000 mg | ORAL_CAPSULE | Freq: Three times a day (TID) | ORAL | 0 refills | Status: DC

## 2021-03-20 MED ORDER — AMOXICILLIN-POT CLAVULANATE 875-125 MG PO TABS: 1.0000 | ORAL_TABLET | Freq: Two times a day (BID) | ORAL | 0 refills | Status: DC

## 2021-03-20 MED ORDER — FLUCONAZOLE 150 MG PO TABS: 150.0000 mg | ORAL_TABLET | Freq: Every day | ORAL | 0 refills | Status: DC

## 2021-03-20 NOTE — Discharge Instructions (Addendum)
Take medications as prescribed Drink plenty of fluids, rest Recommend Delsym for cough

## 2021-03-20 NOTE — ED Provider Notes (Signed)
[name]    CSN: [id number] Arrival date & time: 03/20/21  1051      History   Chief Complaint Chief Complaint  Patient presents with   Cough    HPI Anne [name] is a 65 y.o. female.   Pt reports she has completed the prednisone prescribed earlier this week.  Had initial improvement, but now cough, congestion, and sinus pressure are worse.  She reports fatigue and reports some night sweats.  Denies fever, n/v/d, shortness of breath.  She has been using her inhaler with minimal relief.  Has been taking mucinex with minimal relief.    Past Medical History:  Diagnosis Date   Anxiety    Arthritis    Cataract    hx of   Chronic low back pain    Deviated septum    Dyslipidemia    GERD (gastroesophageal reflux disease)    occ   History of hiatal hernia    Hypertension    Legally blind    Right eye   Major depression    Migraines    Morbid obesity ([place])    Pneumonia     Patient Active Problem List   Diagnosis Date Noted   OA (osteoarthritis) of knee 12/26/2016    Past Surgical History:  Procedure Laterality Date   CATARACT EXTRACTION, BILATERAL     CHOLECYSTECTOMY     COLONOSCOPY WITH PROPOFOL N/A 11/12/2013   Procedure: COLONOSCOPY WITH PROPOFOL;  Surgeon: Garlan Fair, MD;  Location: [facility];  Service: Endoscopy;  Laterality: N/A;   IR EMBO VENOUS NOT HEMORR HEMANG  INC GUIDE ROADMAPPING  12/03/2019   lower back surgery     RETINAL DETACHMENT SURGERY Right    x2   right arm tendon repair  2005   ROTATOR CUFF REPAIR Bilateral    both sholulders   TOTAL KNEE ARTHROPLASTY Left 12/26/2016   Procedure: LEFT TOTAL KNEE ARTHROPLASTY;  Surgeon: Gaynelle Arabian, MD;  Location: [facility];  Service: Orthopedics;  Laterality: Left;  requests 40mins   TOTAL KNEE ARTHROPLASTY Right 11/05/2018   Procedure: TOTAL KNEE ARTHROPLASTY;  Surgeon: Gaynelle Arabian, MD;  Location: [facility];  Service: Orthopedics;  Laterality: Right;  65min    OB History     Gravida   0   Para      Term      Preterm      AB      Living         SAB      IAB      Ectopic      Multiple      Live Births               Home Medications    Prior to Admission medications   Medication Sig Start Date End Date Taking? Authorizing Provider  albuterol (VENTOLIN HFA) 108 (90 Base) MCG/ACT inhaler Inhale 1-2 puffs into the lungs every 6 (six) hours as needed for wheezing or shortness of breath. 03/15/21  Yes Hazel Sams, PA-C  ALPRAZolam Duanne Moron) 1 MG tablet Take 1 mg by mouth at bedtime as needed for sleep.   Yes [provider]  amoxicillin-clavulanate (AUGMENTIN) 875-125 MG tablet Take 1 tablet by mouth every 12 (twelve) hours. 03/20/21  Yes Mirissa Lopresti, PA-C  atorvastatin (LIPITOR) 20 MG tablet Take 20 mg by mouth at bedtime. 12/05/16  Yes [provider]  benzonatate (TESSALON) 100 MG capsule Take 1 capsule (100 mg total) by mouth every 8 (eight)  hours. 03/20/21  Yes Jerold Yoss, Janett Billow, PA-C  buPROPion (WELLBUTRIN XL) 150 MG 24 hr tablet Take 150 mg by mouth daily.   Yes [provider]  calcium carbonate (TUMS - DOSED IN MG ELEMENTAL CALCIUM) 500 MG chewable tablet Chew 1-2 tablets by mouth daily as needed for indigestion or heartburn.   Yes [provider]  Calcium Carbonate-Vitamin D (CALTRATE 600+D PO) Take 1 tablet by mouth daily.   Yes [provider]  Carboxymethylcell-Hypromellose (GENTEAL OP) Apply 1 drop to eye 3 (three) times daily as needed (dry eyes).   Yes [provider]  Feverfew 380 MG CAPS Take 380 mg by mouth daily.   Yes [provider]  fluconazole (DIFLUCAN) 150 MG tablet Take 1 tablet (150 mg total) by mouth daily. 03/20/21  Yes Hildy Nicholl, Janett Billow, PA-C  Multiple Vitamin (MULTIVITAMIN WITH MINERALS) TABS tablet Take 1 tablet by mouth daily.   Yes [provider]  Omega-3 Fatty Acids (FISH OIL PO) Take 720 mg by mouth daily.   Yes [provider]  potassium  chloride SA (K-DUR,KLOR-CON) 20 MEQ tablet Take 20 mEq by mouth at bedtime.  12/12/16  Yes [provider]  predniSONE (DELTASONE) 50 MG tablet Take 1 tablet (50 mg total) by mouth daily for 5 days. 03/15/21 03/20/21 Yes Hazel Sams, PA-C  triamterene-hydrochlorothiazide (MAXZIDE) 75-50 MG per tablet Take 0.5 tablets by mouth daily.    Yes [provider]  BLACK CURRANT SEED OIL PO Take 1,250 mg by mouth daily.    [provider]  gabapentin (NEURONTIN) 300 MG capsule Take 1 capsule (300 mg total) by mouth 3 (three) times daily. Take a 300 mg capsule three times a day for two weeks following surgery.Then take a 300 mg capsule two times a day for two weeks. Then take a 300 mg capsule once a day for two weeks. Then discontinue. 11/06/18   Edmisten, Kristie L, PA  GARCINIA CAMBOGIA-CHROMIUM PO Take 2,100 mg by mouth 3 (three) times daily.    [provider]  methocarbamol (ROBAXIN) 500 MG tablet Take 1 tablet (500 mg total) by mouth every 6 (six) hours as needed for muscle spasms. 11/06/18   Edmisten, Kristie L, PA  OVER THE COUNTER MEDICATION Take 1 tablet by mouth 3 (three) times a week. Butterbur otc supplement    [provider]  oxyCODONE (OXY IR/ROXICODONE) 5 MG immediate release tablet Take 1-2 tablets (5-10 mg total) by mouth every 6 (six) hours as needed for severe pain. 11/06/18   Edmisten, Kristie L, PA  sodium chloride (OCEAN) 0.65 % SOLN nasal spray Place 1 spray into both nostrils as needed for congestion.    [provider]  traMADol (ULTRAM) 50 MG tablet Take 1-2 tablets (50-100 mg total) by mouth every 6 (six) hours as needed for moderate pain. 11/06/18   Edmisten, Kristie L, PA  traZODone (DESYREL) 150 MG tablet Take 150 mg by mouth at bedtime as needed for sleep.    [provider]  vitamin C (ASCORBIC ACID) 250 MG tablet Take 500 mg by mouth daily.    [provider]    Family History Family History  Problem Relation  Age of Onset   Diabetes Mother    Hypertension Mother    Heart attack Father    Cancer Father        lung    Social History Social History   Tobacco Use   Smoking status: Never   Smokeless tobacco: Never  Vaping Use  Vaping Use: Never used  Substance Use Topics   Alcohol use: Yes    Comment: very rare use   Drug use: No     Allergies   Patient has no known allergies.   Review of Systems Review of Systems  Constitutional:  Positive for fatigue. Negative for chills and fever.  HENT:  Positive for congestion and sinus pressure. Negative for ear pain and sore throat.   Eyes:  Negative for pain and visual disturbance.  Respiratory:  Positive for cough. Negative for shortness of breath and wheezing.   Cardiovascular:  Negative for chest pain and palpitations.  Gastrointestinal:  Negative for abdominal pain and vomiting.  Genitourinary:  Negative for dysuria and hematuria.  Musculoskeletal:  Negative for arthralgias and back pain.  Skin:  Negative for color change and rash.  Neurological:  Negative for seizures and syncope.  All other systems reviewed and are negative.   Physical Exam Triage Vital Signs ED Triage Vitals  Enc Vitals Group     BP 03/20/21 1200 107/83     Pulse Rate 03/20/21 1200 69     Resp 03/20/21 1200 20     Temp 03/20/21 1200 97.8 F (36.6 C)     Temp Source 03/20/21 1200 Oral     SpO2 03/20/21 1200 96 %     Weight --      Height --      Head Circumference --      Peak Flow --      Pain Score 03/20/21 1201 4     Pain Loc --      Pain Edu? --      Excl. in [place]? --    No data found.  Updated Vital Signs BP 107/83   Pulse 69   Temp 97.8 F (36.6 C) (Oral)   Resp 20   LMP  (LMP Unknown)   SpO2 96%   Visual Acuity Right Eye Distance:   Left Eye Distance:   Bilateral Distance:    Right Eye Near:   Left Eye Near:    Bilateral Near:     Physical Exam Vitals and nursing note reviewed.  Constitutional:      General: She is not in  acute distress.    Appearance: She is well-developed.  HENT:     Head: Normocephalic and atraumatic.  Eyes:     Conjunctiva/sclera: Conjunctivae normal.  Cardiovascular:     Rate and Rhythm: Normal rate and regular rhythm.     Heart sounds: No murmur heard. Pulmonary:     Effort: Pulmonary effort is normal. No respiratory distress.     Breath sounds: Normal breath sounds.  Abdominal:     Palpations: Abdomen is soft.     Tenderness: There is no abdominal tenderness.  Musculoskeletal:     Cervical back: Neck supple.  Skin:    General: Skin is warm and dry.  Neurological:     Mental Status: She is alert.     UC Treatments / Results  Labs (all labs ordered are listed, but only abnormal results are displayed) Labs Reviewed - No data to display  EKG   Radiology DG Chest 2 View  Result Date: 03/20/2021 CLINICAL DATA:  Persistent productive cough. EXAM: CHEST - 2 VIEW COMPARISON:  October 03, 2018 FINDINGS: Cardiomediastinal silhouette is normal. Mediastinal contours appear intact. Tortuosity of the aorta. There is no evidence of focal airspace consolidation, pleural effusion or pneumothorax. Osseous structures are without acute abnormality. Soft tissues are grossly normal. IMPRESSION:  1. No active cardiopulmonary disease. 2. Tortuosity of the aorta. Electronically Signed   By: Fidela Salisbury M.D.   On: 03/20/2021 13:04    Procedures Procedures (including critical care time)  Medications Ordered in UC Medications - No data to display  Initial Impression / Assessment and Plan / UC Course  I have reviewed the triage vital signs and the nursing notes.  Pertinent labs & imaging results that were available during my care of the patient were reviewed by me and considered in my medical decision making (see chart for details).     Pt reports initial improvement, but now with worsening sx; congestion and cough. Will treat for sinusitis with augmentin.  Tessalon prescribed.  Chest  xray negative for PNA.  She will continue with albuterol as needed.  Return precautions discussed.  Final Clinical Impressions(s) / UC Diagnoses   Final diagnoses:  Acute non-recurrent sinusitis, unspecified location  Cough     Discharge Instructions      Take medications as prescribed Drink plenty of fluids, rest Recommend Delsym for cough   ED Prescriptions     Medication Sig Dispense Auth. Provider   amoxicillin-clavulanate (AUGMENTIN) 875-125 MG tablet Take 1 tablet by mouth every 12 (twelve) hours. 14 tablet Saydie Gerdts, PA-C   benzonatate (TESSALON) 100 MG capsule Take 1 capsule (100 mg total) by mouth every 8 (eight) hours. 21 capsule Tevin Shillingford, PA-C   fluconazole (DIFLUCAN) 150 MG tablet Take 1 tablet (150 mg total) by mouth daily. 1 tablet Konrad Felix, PA-C      PDMP not reviewed this encounter.   Konrad Felix, PA-C 03/20/21 1323

## 2021-03-20 NOTE — ED Triage Notes (Signed)
Pt was seen 03/15/21 for same; states completed her prednisone Rx, but states she has not had any improvement.  C/O "breaking out in sweats" at times, but denies fevers.  C/O fatigue and weakness.  Has been using incentive spirometer at home.

## 2021-05-05 DIAGNOSIS — M13841 Other specified arthritis, right hand: Secondary | ICD-10-CM | POA: Diagnosis not present

## 2021-05-05 DIAGNOSIS — G5603 Carpal tunnel syndrome, bilateral upper limbs: Secondary | ICD-10-CM | POA: Diagnosis not present

## 2021-05-05 DIAGNOSIS — M180 Bilateral primary osteoarthritis of first carpometacarpal joints: Secondary | ICD-10-CM | POA: Insufficient documentation

## 2021-05-10 DIAGNOSIS — Z0000 Encounter for general adult medical examination without abnormal findings: Secondary | ICD-10-CM | POA: Diagnosis not present

## 2021-05-17 DIAGNOSIS — Z0000 Encounter for general adult medical examination without abnormal findings: Secondary | ICD-10-CM | POA: Diagnosis not present

## 2021-05-17 DIAGNOSIS — E7800 Pure hypercholesterolemia, unspecified: Secondary | ICD-10-CM | POA: Diagnosis not present

## 2021-07-12 ENCOUNTER — Other Ambulatory Visit: Payer: Self-pay | Admitting: Internal Medicine

## 2021-07-12 ENCOUNTER — Other Ambulatory Visit: Payer: Self-pay | Admitting: Specialist

## 2021-07-12 DIAGNOSIS — Z1231 Encounter for screening mammogram for malignant neoplasm of breast: Secondary | ICD-10-CM

## 2021-09-07 ENCOUNTER — Ambulatory Visit (INDEPENDENT_AMBULATORY_CARE_PROVIDER_SITE_OTHER): Payer: Medicare Other

## 2021-09-07 ENCOUNTER — Ambulatory Visit
Admission: EM | Admit: 2021-09-07 | Discharge: 2021-09-07 | Disposition: A | Discharge status: Home / Self Care | Payer: Medicare Other | Attending: Physician Assistant | Admitting: Physician Assistant

## 2021-09-07 ENCOUNTER — Other Ambulatory Visit: Payer: Self-pay

## 2021-09-07 DIAGNOSIS — R059 Cough, unspecified: Secondary | ICD-10-CM

## 2021-09-07 DIAGNOSIS — J209 Acute bronchitis, unspecified: Secondary | ICD-10-CM

## 2021-09-07 DIAGNOSIS — R509 Fever, unspecified: Secondary | ICD-10-CM

## 2021-09-07 MED ORDER — PREDNISONE 20 MG PO TABS: 40.0000 mg | ORAL_TABLET | Freq: Every day | ORAL | 0 refills | Status: AC

## 2021-09-07 NOTE — ED Provider Notes (Signed)
[name]    CSN: [id number] Arrival date & time: 09/07/21  1018      History   Chief Complaint Chief Complaint  Patient presents with   Fever    HPI Anne [name] is a 65 y.o. female.   Patient here today for evaluation of cough that she has had for the last 2 weeks.  She reports that yesterday she started get fever.  She did have leftover cephalexin and has been taking this which has seemed to help a little bit.  T-max was 101 yesterday.  She has not had any vomiting or diarrhea.  The history is provided by the patient.  Fever Associated symptoms: cough   Associated symptoms: no congestion, no diarrhea, no nausea, no sore throat and no vomiting    Past Medical History:  Diagnosis Date   Anxiety    Arthritis    Cataract    hx of   Chronic low back pain    Deviated septum    Dyslipidemia    GERD (gastroesophageal reflux disease)    occ   History of hiatal hernia    Hypertension    Legally blind    Right eye   Major depression    Migraines    Morbid obesity ([place])    Pneumonia     Patient Active Problem List   Diagnosis Date Noted   OA (osteoarthritis) of knee 12/26/2016    Past Surgical History:  Procedure Laterality Date   CATARACT EXTRACTION, BILATERAL     CHOLECYSTECTOMY     COLONOSCOPY WITH PROPOFOL N/A 11/12/2013   Procedure: COLONOSCOPY WITH PROPOFOL;  Surgeon: Garlan Fair, MD;  Location: [facility];  Service: Endoscopy;  Laterality: N/A;   IR EMBO VENOUS NOT HEMORR HEMANG  INC GUIDE ROADMAPPING  12/03/2019   lower back surgery     RETINAL DETACHMENT SURGERY Right    x2   right arm tendon repair  2005   ROTATOR CUFF REPAIR Bilateral    both sholulders   TOTAL KNEE ARTHROPLASTY Left 12/26/2016   Procedure: LEFT TOTAL KNEE ARTHROPLASTY;  Surgeon: Gaynelle Arabian, MD;  Location: [facility];  Service: Orthopedics;  Laterality: Left;  requests 43mins   TOTAL KNEE ARTHROPLASTY Right 11/05/2018   Procedure: TOTAL KNEE ARTHROPLASTY;  Surgeon:  Gaynelle Arabian, MD;  Location: [facility];  Service: Orthopedics;  Laterality: Right;  19min    OB History     Gravida  0   Para      Term      Preterm      AB      Living         SAB      IAB      Ectopic      Multiple      Live Births               Home Medications    Prior to Admission medications   Medication Sig Start Date End Date Taking? Authorizing Provider  predniSONE (DELTASONE) 20 MG tablet Take 2 tablets (40 mg total) by mouth daily with breakfast for 5 days. 09/07/21 09/12/21 Yes Francene Finders, PA-C  albuterol (VENTOLIN HFA) 108 (90 Base) MCG/ACT inhaler Inhale 1-2 puffs into the lungs every 6 (six) hours as needed for wheezing or shortness of breath. 03/15/21   Hazel Sams, PA-C  ALPRAZolam Duanne Moron) 1 MG tablet Take 1 mg by mouth at bedtime as needed for sleep.    [provider]  amoxicillin-clavulanate (AUGMENTIN) 875-125 MG tablet Take 1 tablet by mouth every 12 (twelve) hours. 03/20/21   Ward, Lenise Arena, PA-C  atorvastatin (LIPITOR) 20 MG tablet Take 20 mg by mouth at bedtime. 12/05/16   [provider]  benzonatate (TESSALON) 100 MG capsule Take 1 capsule (100 mg total) by mouth every 8 (eight) hours. 03/20/21   Ward, Lenise Arena, PA-C  BLACK CURRANT SEED OIL PO Take 1,250 mg by mouth daily.    [provider]  buPROPion (WELLBUTRIN XL) 150 MG 24 hr tablet Take 150 mg by mouth daily.    [provider]  calcium carbonate (TUMS - DOSED IN MG ELEMENTAL CALCIUM) 500 MG chewable tablet Chew 1-2 tablets by mouth daily as needed for indigestion or heartburn.    [provider]  Calcium Carbonate-Vitamin D (CALTRATE 600+D PO) Take 1 tablet by mouth daily.    [provider]  Carboxymethylcell-Hypromellose (GENTEAL OP) Apply 1 drop to eye 3 (three) times daily as needed (dry eyes).    [provider]  Feverfew 380 MG CAPS Take 380 mg by mouth daily.    [provider]  fluconazole  (DIFLUCAN) 150 MG tablet Take 1 tablet (150 mg total) by mouth daily. 03/20/21   Ward, Lenise Arena, PA-C  gabapentin (NEURONTIN) 300 MG capsule Take 1 capsule (300 mg total) by mouth 3 (three) times daily. Take a 300 mg capsule three times a day for two weeks following surgery.Then take a 300 mg capsule two times a day for two weeks. Then take a 300 mg capsule once a day for two weeks. Then discontinue. 11/06/18   Edmisten, Kristie L, PA  GARCINIA CAMBOGIA-CHROMIUM PO Take 2,100 mg by mouth 3 (three) times daily.    [provider]  Multiple Vitamin (MULTIVITAMIN WITH MINERALS) TABS tablet Take 1 tablet by mouth daily.    [provider]  Omega-3 Fatty Acids (FISH OIL PO) Take 720 mg by mouth daily.    [provider]  OVER THE COUNTER MEDICATION Take 1 tablet by mouth 3 (three) times a week. [place] otc supplement    [provider]  oxyCODONE (OXY IR/ROXICODONE) 5 MG immediate release tablet Take 1-2 tablets (5-10 mg total) by mouth every 6 (six) hours as needed for severe pain. 11/06/18   Edmisten, Kristie L, PA  potassium chloride SA (K-DUR,KLOR-CON) 20 MEQ tablet Take 20 mEq by mouth at bedtime.  12/12/16   [provider]  sodium chloride (OCEAN) 0.65 % SOLN nasal spray Place 1 spray into both nostrils as needed for congestion.    [provider]  traMADol (ULTRAM) 50 MG tablet Take 1-2 tablets (50-100 mg total) by mouth every 6 (six) hours as needed for moderate pain. 11/06/18   Edmisten, Kristie L, PA  traZODone (DESYREL) 150 MG tablet Take 150 mg by mouth at bedtime as needed for sleep.    [provider]  triamterene-hydrochlorothiazide (MAXZIDE) 75-50 MG per tablet Take 0.5 tablets by mouth daily.     [provider]  vitamin C (ASCORBIC ACID) 250 MG tablet Take 500 mg by mouth daily.    [provider]    Family History Family History  Problem Relation Age of Onset   Diabetes Mother    Hypertension Mother     Heart attack Father    Cancer Father        lung    Social History Social History   Tobacco Use   Smoking status: Never   Smokeless tobacco:  Never  Vaping Use   Vaping Use: Never used  Substance Use Topics   Alcohol use: Yes    Comment: very rare use   Drug use: No     Allergies   Patient has no known allergies.   Review of Systems Review of Systems  Constitutional:  Positive for fever.  HENT:  Negative for congestion and sore throat.   Eyes:  Negative for discharge and redness.  Respiratory:  Positive for cough. Negative for shortness of breath.   Gastrointestinal:  Negative for diarrhea, nausea and vomiting.    Physical Exam Triage Vital Signs ED Triage Vitals  Enc Vitals Group     BP 09/07/21 1238 (!) 174/83     Pulse Rate 09/07/21 1238 88     Resp 09/07/21 1238 18     Temp 09/07/21 1238 99 F (37.2 C)     Temp Source 09/07/21 1238 Oral     SpO2 09/07/21 1238 96 %     Weight --      Height --      Head Circumference --      Peak Flow --      Pain Score 09/07/21 1239 7     Pain Loc --      Pain Edu? --      Excl. in [place]? --    No data found.  Updated Vital Signs BP (!) 174/83 (BP Location: Left Arm)   Pulse 88   Temp 99 F (37.2 C) (Oral)   Resp 18   LMP  (LMP Unknown)   SpO2 96%      Physical Exam Vitals and nursing note reviewed.  Constitutional:      General: She is not in acute distress.    Appearance: Normal appearance. She is not ill-appearing.  HENT:     Head: Normocephalic and atraumatic.     Nose: Congestion present.  Eyes:     Conjunctiva/sclera: Conjunctivae normal.  Cardiovascular:     Rate and Rhythm: Normal rate and regular rhythm.     Heart sounds: Normal heart sounds. No murmur heard. Pulmonary:     Effort: Pulmonary effort is normal. No respiratory distress.     Breath sounds: Normal breath sounds. No wheezing, rhonchi or rales.  Skin:    General: Skin is warm and dry.  Neurological:     Mental Status: She is alert.   Psychiatric:        Mood and Affect: Mood normal.        Thought Content: Thought content normal.     UC Treatments / Results  Labs (all labs ordered are listed, but only abnormal results are displayed) Labs Reviewed  COVID-19, FLU A+B NAA    EKG   Radiology DG Chest 2 View  Result Date: 09/07/2021 CLINICAL DATA:  Cough for 2 weeks. EXAM: CHEST - 2 VIEW COMPARISON:  Chest x-ray 03/20/2021 FINDINGS: The cardiac silhouette, mediastinal and hilar contours are within normal limits and stable. Stable tortuosity of the thoracic aorta. Peribronchial thickening and increased interstitial markings suggesting bronchitis or interstitial pneumonitis. No infiltrates, edema or effusions. No pulmonary lesions. The bony thorax is intact. IMPRESSION: Bronchitis or interstitial pneumonitis. Electronically Signed   By: Marijo Sanes M.D.   On: 09/07/2021 13:09    Procedures Procedures (including critical care time)  Medications Ordered in UC Medications - No data to display  Initial Impression / Assessment and Plan / UC Course  I have reviewed the triage vital signs and the nursing  notes.  Pertinent labs & imaging results that were available during my care of the patient were reviewed by me and considered in my medical decision making (see chart for details).  X-ray consistent with bronchitis.  Will treat with steroid burst.  She can continue antibiotic.  COVID and flu screening ordered.  Encouraged follow-up if no gradual improvement or if symptoms worsen.  Final Clinical Impressions(s) / UC Diagnoses   Final diagnoses:  Acute bronchitis, unspecified organism  Fever, unspecified   Discharge Instructions   None    ED Prescriptions     Medication Sig Dispense Auth. Provider   predniSONE (DELTASONE) 20 MG tablet Take 2 tablets (40 mg total) by mouth daily with breakfast for 5 days. 10 tablet Francene Finders, PA-C      PDMP not reviewed this encounter.   Francene Finders,  PA-C 09/07/21 1338

## 2021-09-07 NOTE — ED Triage Notes (Addendum)
Pt c/o cough x2wks, headache since Saturday, and fever since yesterday. States taking advil every 6 hrs. States had a neg home covid test.

## 2021-09-08 LAB — COVID-19, FLU A+B NAA
Influenza A, NAA: NOT DETECTED
Influenza B, NAA: NOT DETECTED
SARS-CoV-2, NAA: DETECTED — AB

## 2021-09-13 ENCOUNTER — Ambulatory Visit: Payer: BC Managed Care – PPO

## 2021-09-22 ENCOUNTER — Ambulatory Visit
Admission: RE | Admit: 2021-09-22 | Discharge: 2021-09-22 | Disposition: A | Discharge status: Home / Self Care | Payer: Medicare Other | Source: Ambulatory Visit | Attending: Internal Medicine | Admitting: Internal Medicine

## 2021-09-22 DIAGNOSIS — Z1231 Encounter for screening mammogram for malignant neoplasm of breast: Secondary | ICD-10-CM

## 2021-11-11 DIAGNOSIS — G5603 Carpal tunnel syndrome, bilateral upper limbs: Secondary | ICD-10-CM | POA: Diagnosis not present

## 2021-11-17 DIAGNOSIS — E7800 Pure hypercholesterolemia, unspecified: Secondary | ICD-10-CM | POA: Diagnosis not present

## 2021-11-24 ENCOUNTER — Other Ambulatory Visit: Payer: Self-pay | Admitting: Registered Nurse

## 2021-11-24 DIAGNOSIS — E7800 Pure hypercholesterolemia, unspecified: Secondary | ICD-10-CM

## 2021-11-24 DIAGNOSIS — F419 Anxiety disorder, unspecified: Secondary | ICD-10-CM | POA: Diagnosis not present

## 2021-11-24 DIAGNOSIS — I10 Essential (primary) hypertension: Secondary | ICD-10-CM | POA: Diagnosis not present

## 2021-11-24 DIAGNOSIS — Z23 Encounter for immunization: Secondary | ICD-10-CM | POA: Diagnosis not present

## 2021-11-24 DIAGNOSIS — E559 Vitamin D deficiency, unspecified: Secondary | ICD-10-CM | POA: Diagnosis not present

## 2021-12-03 DIAGNOSIS — Z1212 Encounter for screening for malignant neoplasm of rectum: Secondary | ICD-10-CM | POA: Diagnosis not present

## 2021-12-03 DIAGNOSIS — Z1211 Encounter for screening for malignant neoplasm of colon: Secondary | ICD-10-CM | POA: Diagnosis not present

## 2021-12-22 DIAGNOSIS — G5601 Carpal tunnel syndrome, right upper limb: Secondary | ICD-10-CM | POA: Diagnosis not present

## 2021-12-30 ENCOUNTER — Ambulatory Visit
Admission: RE | Admit: 2021-12-30 | Discharge: 2021-12-30 | Disposition: A | Discharge status: Home / Self Care | Payer: No Typology Code available for payment source | Source: Ambulatory Visit | Attending: Registered Nurse | Admitting: Registered Nurse

## 2021-12-30 DIAGNOSIS — E7800 Pure hypercholesterolemia, unspecified: Secondary | ICD-10-CM

## 2022-01-05 DIAGNOSIS — M25631 Stiffness of right wrist, not elsewhere classified: Secondary | ICD-10-CM | POA: Diagnosis not present

## 2022-01-19 DIAGNOSIS — K5904 Chronic idiopathic constipation: Secondary | ICD-10-CM | POA: Diagnosis not present

## 2022-01-19 DIAGNOSIS — R195 Other fecal abnormalities: Secondary | ICD-10-CM | POA: Diagnosis not present

## 2022-01-19 DIAGNOSIS — Z1211 Encounter for screening for malignant neoplasm of colon: Secondary | ICD-10-CM | POA: Diagnosis not present

## 2022-01-26 DIAGNOSIS — K6389 Other specified diseases of intestine: Secondary | ICD-10-CM | POA: Diagnosis not present

## 2022-01-26 DIAGNOSIS — Z1211 Encounter for screening for malignant neoplasm of colon: Secondary | ICD-10-CM | POA: Diagnosis not present

## 2022-01-26 DIAGNOSIS — K635 Polyp of colon: Secondary | ICD-10-CM | POA: Diagnosis not present

## 2022-01-27 DIAGNOSIS — C182 Malignant neoplasm of ascending colon: Secondary | ICD-10-CM | POA: Insufficient documentation

## 2022-01-27 DIAGNOSIS — K635 Polyp of colon: Secondary | ICD-10-CM | POA: Diagnosis not present

## 2022-01-27 DIAGNOSIS — K6389 Other specified diseases of intestine: Secondary | ICD-10-CM | POA: Diagnosis not present

## 2022-01-31 DIAGNOSIS — C180 Malignant neoplasm of cecum: Secondary | ICD-10-CM | POA: Diagnosis not present

## 2022-02-03 DIAGNOSIS — C180 Malignant neoplasm of cecum: Secondary | ICD-10-CM | POA: Diagnosis not present

## 2022-02-07 ENCOUNTER — Ambulatory Visit: Payer: Self-pay | Admitting: Internal Medicine

## 2022-02-07 DIAGNOSIS — C180 Malignant neoplasm of cecum: Secondary | ICD-10-CM | POA: Diagnosis not present

## 2022-02-07 NOTE — Progress Notes (Signed)
?Cardiology Office Note:   ? ?Date:  02/08/2022  ? ?ID:  Anne [name], DOB [birth year], MRN [id number] ? ?PCP:  Deland Pretty, MD ?  ?[facility] Providers ?Cardiologist:  None    ? ?Referring MD: Deland Pretty, MD  ? ?No chief complaint on file. ?Elevated CAC ? ?History of Present Illness:   ? ?Anne [name] is a 66 y.o. female with a hx of anxiety, arthritis, GERD, A1c 5.6%referral for elevated CAC agatston score 662 ,97th percentile ? ?She notes CAC was done for screening. She has no prior cardiac hx. No smoking hx. She walks at work. She rides a bike sometimes. No chest pressure. No limiting DOE. She can do one flight of stairs without issues. With a few flights she can feel some SOB.This is chronic. She denies chest pressure.  Mother has heart disease. She has a pacemaker. Father had heart disease and cancer. Sister is healthy. She has colon cancer 2 weeks ago.  She is planned for surgery on May 22nd. ? ?Past Medical History:  ?Diagnosis Date  ? Anxiety   ? Arthritis   ? Cataract   ? hx of  ? Chronic low back pain   ? Deviated septum   ? Dyslipidemia   ? GERD (gastroesophageal reflux disease)   ? occ  ? History of hiatal hernia   ? Hypertension   ? Legally blind   ? Right eye  ? Major depression   ? Migraines   ? Morbid obesity ([place])   ? Pneumonia   ? ? ?Past Surgical History:  ?Procedure Laterality Date  ? CATARACT EXTRACTION, BILATERAL    ? CHOLECYSTECTOMY    ? COLONOSCOPY WITH PROPOFOL N/A 11/12/2013  ? Procedure: COLONOSCOPY WITH PROPOFOL;  Surgeon: Garlan Fair, MD;  Location: [facility];  Service: Endoscopy;  Laterality: N/A;  ? IR EMBO VENOUS NOT HEMORR HEMANG  INC GUIDE ROADMAPPING  12/03/2019  ? lower back surgery    ? RETINAL DETACHMENT SURGERY Right   ? x2  ? right arm tendon repair  2005  ? ROTATOR CUFF REPAIR Bilateral   ? both sholulders  ? TOTAL KNEE ARTHROPLASTY Left 12/26/2016  ? Procedure: LEFT TOTAL KNEE ARTHROPLASTY;  Surgeon: Gaynelle Arabian, MD;  Location: [facility];  Service: Orthopedics;   Laterality: Left;  requests 35mns  ? TOTAL KNEE ARTHROPLASTY Right 11/05/2018  ? Procedure: TOTAL KNEE ARTHROPLASTY;  Surgeon: AGaynelle Arabian MD;  Location: [facility];  Service: Orthopedics;  Laterality: Right;  560m  ? ? ?Current Medications: ?Current Outpatient Medications on File Prior to Visit  ?Medication Sig Dispense Refill  ? ALPRAZolam (XANAX) 1 MG tablet Take 1 mg by mouth at bedtime as needed for sleep.    ? buPROPion (WELLBUTRIN XL) 150 MG 24 hr tablet Take 150 mg by mouth daily.    ? calcium carbonate (TUMS - DOSED IN MG ELEMENTAL CALCIUM) 500 MG chewable tablet Chew 1-2 tablets by mouth daily as needed for indigestion or heartburn.    ? Calcium Carbonate-Vitamin D (CALTRATE 600+D PO) Take 1 tablet by mouth daily.    ? Carboxymethylcell-Hypromellose (GENTEAL OP) Apply 1 drop to eye 3 (three) times daily as needed (dry eyes).    ? Feverfew 380 MG CAPS Take 380 mg by mouth daily.    ? Multiple Vitamin (MULTIVITAMIN WITH MINERALS) TABS tablet Take 1 tablet by mouth daily.    ? potassium chloride SA (K-DUR,KLOR-CON) 20 MEQ tablet Take 20 mEq by mouth at bedtime.   3  ?  triamterene-hydrochlorothiazide (MAXZIDE) 75-50 MG per tablet Take 0.5 tablets by mouth daily.     ? ?No current facility-administered medications on file prior to visit.  ? ? ?Allergies:   Patient has no known allergies.  ? ?Social History  ? ?Socioeconomic History  ? Marital status: Single  ?  Spouse name: Not on file  ? Number of children: Not on file  ? Years of education: Not on file  ? Highest education level: Not on file  ?Occupational History  ? Not on file  ?Tobacco Use  ? Smoking status: Never  ? Smokeless tobacco: Never  ?Vaping Use  ? Vaping Use: Never used  ?Substance and Sexual Activity  ? Alcohol use: Yes  ?  Comment: very rare use  ? Drug use: No  ? Sexual activity: Not on file  ?Other Topics Concern  ? Not on file  ?Social History Narrative  ? Not on file  ? ?Social Determinants of Health  ? ?Financial Resource Strain: Not on  file  ?Food Insecurity: Not on file  ?Transportation Needs: Not on file  ?Physical Activity: Not on file  ?Stress: Not on file  ?Social Connections: Not on file  ?  ? ?Family History: ?The patient's family history includes Cancer in her father; Diabetes in her mother; Heart attack in her father; Hypertension in her mother. ? ?ROS:   ?Please see the history of present illness.    ? All other systems reviewed and are negative. ? ?EKGs/Labs/Other Studies Reviewed:   ? ?The following studies were reviewed today: ? ? ?EKG:  EKG is  ordered today.  The ekg ordered today demonstrates  ? ?NSR, inferior axis PVC, septal q ? ?Recent Labs: ?No results found for requested labs within last 8760 hours.  ?Recent Lipid Panel ?   ?Component Value Date/Time  ? CHOL 203 (H) 10/15/2008 2009  ? TRIG 155 (H) 10/15/2008 2009  ? HDL 61 10/15/2008 2009  ? CHOLHDL 3.3 Ratio 10/15/2008 2009  ? VLDL 31 10/15/2008 2009  ? [place] 111 (H) 10/15/2008 2009  ? ? ? ?Risk Assessment/Calculations:   ?  ? ?    ? ?Physical Exam:   ? ?VS:  ? ?Vitals:  ? 02/08/22 0905  ?BP: 136/82  ?Pulse: 77  ?SpO2: 98%  ? ? ? BP 136/82   Pulse 77   Ht '5\' 7"'$  (1.702 m)   Wt 232 lb 9.6 oz (105.5 kg)   LMP  (LMP Unknown)   SpO2 98%   BMI 36.43 kg/m?    ? ?Wt Readings from Last 3 Encounters:  ?02/08/22 232 lb 9.6 oz (105.5 kg)  ?11/05/18 231 lb (104.8 kg)  ?10/31/18 231 lb (104.8 kg)  ?  ? ?GEN:  Well nourished, well developed in no acute distress ?HEENT: Normal ?NECK: No JVD; No carotid bruits ?LYMPHATICS: No lymphadenopathy ?CARDIAC: RRR, no murmurs, rubs, gallops ?RESPIRATORY:  Clear to auscultation without rales, wheezing or rhonchi  ?ABDOMEN: Soft, non-tender, non-distended ?MUSCULOSKELETAL:  No edema; No deformity  ?SKIN: Warm and dry ?NEUROLOGIC:  Alert and oriented x 3 ?PSYCHIATRIC:  Normal affect  ? ?ASSESSMENT:   ? ?Elevated CAC: increased atorvastatin. LDL goal < 70 mg/dL, last was 81 mg/dL ? ?PreOp: She can do > 4 METS without symptoms. Don't think she  requires a stress test. Will obtain an echo to ensure no structural heart disease. She's low to intermediate risk for intermediate risk surgery She is acceptable cardiac risk to proceed ? ?HTN - good control. Continue triamterene-HCTz 75-50  mmHg. ? ?PLAN:   ? ?In order of problems listed above: ? ?Increase to atorvastatin 40 mg daily ?TTE ? ?   ? ?   ?Medication Adjustments/Labs and Tests Ordered: ?Current medicines are reviewed at length with the patient today.  Concerns regarding medicines are outlined above.  ?Orders Placed This Encounter  ?Procedures  ? EKG 12-Lead  ? ECHOCARDIOGRAM COMPLETE  ? ?Meds ordered this encounter  ?Medications  ? atorvastatin (LIPITOR) 40 MG tablet  ?  Sig: Take 1 tablet (40 mg total) by mouth at bedtime.  ?  Dispense:  90 tablet  ?  Refill:  3  ?  Dose increase  ? ? ?Patient Instructions  ?Medication Instructions:  ?INCREASE atorvastatin (Lipitor) to 40 mg daily ? ?*If you need a refill on your cardiac medications before your next appointment, please call your pharmacy* ? ?Testing/Procedures: ?Your physician has requested that you have an echocardiogram. Echocardiography is a painless test that uses sound waves to create images of your heart. It provides your doctor with information about the size and shape of your heart and how well your heart?s chambers and valves are working. This procedure takes approximately one hour. There are no restrictions for this procedure. ? ?Follow-Up: ?At [facility], you and your health needs are our priority.  As part of our continuing mission to provide you with exceptional heart care, we have created designated Provider Care Teams.  These Care Teams include your primary Cardiologist (physician) and Advanced Practice Providers (APPs -  Physician Assistants and Nurse Practitioners) who all work together to provide you with the care you need, when you need it. ? ?We recommend signing up for the patient portal called "MyChart".  Sign up information is  provided on this After Visit Summary.  MyChart is used to connect with patients for Virtual Visits (Telemedicine).  Patients are able to view lab/test results, encounter notes, upcoming appointments, etc.  Non-urgen

## 2022-02-08 ENCOUNTER — Encounter: Payer: Self-pay | Admitting: Internal Medicine

## 2022-02-08 ENCOUNTER — Ambulatory Visit: Payer: Self-pay | Admitting: Internal Medicine

## 2022-02-08 ENCOUNTER — Ambulatory Visit (INDEPENDENT_AMBULATORY_CARE_PROVIDER_SITE_OTHER): Payer: HMO | Admitting: Internal Medicine

## 2022-02-08 VITALS — BP 136/82 | HR 77 | Ht 67.0 in | Wt 232.6 lb

## 2022-02-08 DIAGNOSIS — R931 Abnormal findings on diagnostic imaging of heart and coronary circulation: Secondary | ICD-10-CM

## 2022-02-08 MED ORDER — ATORVASTATIN CALCIUM 40 MG PO TABS: 40.0000 mg | ORAL_TABLET | Freq: Every day | ORAL | 3 refills | Status: AC

## 2022-02-08 NOTE — Patient Instructions (Signed)
Medication Instructions:  ?INCREASE atorvastatin (Lipitor) to 40 mg daily ? ?*If you need a refill on your cardiac medications before your next appointment, please call your pharmacy* ? ?Testing/Procedures: ?Your physician has requested that you have an echocardiogram. Echocardiography is a painless test that uses sound waves to create images of your heart. It provides your doctor with information about the size and shape of your heart and how well your heart?s chambers and valves are working. This procedure takes approximately one hour. There are no restrictions for this procedure. ? ?Follow-Up: ?At [facility], you and your health needs are our priority.  As part of our continuing mission to provide you with exceptional heart care, we have created designated Provider Care Teams.  These Care Teams include your primary Cardiologist (physician) and Advanced Practice Providers (APPs -  Physician Assistants and Nurse Practitioners) who all work together to provide you with the care you need, when you need it. ? ?We recommend signing up for the patient portal called "MyChart".  Sign up information is provided on this After Visit Summary.  MyChart is used to connect with patients for Virtual Visits (Telemedicine).  Patients are able to view lab/test results, encounter notes, upcoming appointments, etc.  Non-urgent messages can be sent to your provider as well.   ?To learn more about what you can do with MyChart, go to [email or web address].   ? ?Your next appointment:   ?AS NEEDED with Dr. Harl Bowie  ? ? ?Important Information About Sugar ? ? ? ? ? ? ?

## 2022-02-09 ENCOUNTER — Ambulatory Visit: Payer: Self-pay | Admitting: Cardiovascular Disease

## 2022-02-09 DIAGNOSIS — Z01818 Encounter for other preprocedural examination: Secondary | ICD-10-CM | POA: Diagnosis not present

## 2022-02-09 DIAGNOSIS — E785 Hyperlipidemia, unspecified: Secondary | ICD-10-CM | POA: Insufficient documentation

## 2022-02-09 DIAGNOSIS — E669 Obesity, unspecified: Secondary | ICD-10-CM | POA: Insufficient documentation

## 2022-02-09 DIAGNOSIS — E876 Hypokalemia: Secondary | ICD-10-CM | POA: Diagnosis not present

## 2022-02-09 DIAGNOSIS — Z01810 Encounter for preprocedural cardiovascular examination: Secondary | ICD-10-CM | POA: Diagnosis not present

## 2022-02-09 DIAGNOSIS — Z6838 Body mass index (BMI) 38.0-38.9, adult: Secondary | ICD-10-CM | POA: Diagnosis not present

## 2022-02-09 DIAGNOSIS — R931 Abnormal findings on diagnostic imaging of heart and coronary circulation: Secondary | ICD-10-CM | POA: Diagnosis not present

## 2022-02-09 DIAGNOSIS — F419 Anxiety disorder, unspecified: Secondary | ICD-10-CM | POA: Diagnosis not present

## 2022-02-09 DIAGNOSIS — I10 Essential (primary) hypertension: Secondary | ICD-10-CM | POA: Insufficient documentation

## 2022-02-09 DIAGNOSIS — K6389 Other specified diseases of intestine: Secondary | ICD-10-CM | POA: Diagnosis not present

## 2022-02-09 DIAGNOSIS — C180 Malignant neoplasm of cecum: Secondary | ICD-10-CM | POA: Diagnosis not present

## 2022-02-09 DIAGNOSIS — Z79899 Other long term (current) drug therapy: Secondary | ICD-10-CM | POA: Diagnosis not present

## 2022-02-09 DIAGNOSIS — Z01812 Encounter for preprocedural laboratory examination: Secondary | ICD-10-CM | POA: Diagnosis not present

## 2022-02-15 DIAGNOSIS — C180 Malignant neoplasm of cecum: Secondary | ICD-10-CM | POA: Diagnosis not present

## 2022-02-17 ENCOUNTER — Ambulatory Visit (HOSPITAL_COMMUNITY): Payer: PPO | Attending: Cardiovascular Disease

## 2022-02-17 DIAGNOSIS — E785 Hyperlipidemia, unspecified: Secondary | ICD-10-CM | POA: Diagnosis not present

## 2022-02-17 DIAGNOSIS — R931 Abnormal findings on diagnostic imaging of heart and coronary circulation: Secondary | ICD-10-CM | POA: Diagnosis not present

## 2022-02-17 DIAGNOSIS — I10 Essential (primary) hypertension: Secondary | ICD-10-CM | POA: Diagnosis not present

## 2022-02-17 DIAGNOSIS — M545 Low back pain, unspecified: Secondary | ICD-10-CM | POA: Diagnosis not present

## 2022-02-17 DIAGNOSIS — I2510 Atherosclerotic heart disease of native coronary artery without angina pectoris: Secondary | ICD-10-CM | POA: Insufficient documentation

## 2022-02-17 DIAGNOSIS — E669 Obesity, unspecified: Secondary | ICD-10-CM | POA: Diagnosis not present

## 2022-02-17 DIAGNOSIS — G8929 Other chronic pain: Secondary | ICD-10-CM | POA: Insufficient documentation

## 2022-02-17 LAB — ECHOCARDIOGRAM COMPLETE
Area-P 1/2: 2.46 cm2
S' Lateral: 3.1 cm

## 2022-02-21 DIAGNOSIS — I10 Essential (primary) hypertension: Secondary | ICD-10-CM | POA: Diagnosis not present

## 2022-02-21 DIAGNOSIS — D259 Leiomyoma of uterus, unspecified: Secondary | ICD-10-CM | POA: Diagnosis not present

## 2022-02-21 DIAGNOSIS — E785 Hyperlipidemia, unspecified: Secondary | ICD-10-CM | POA: Diagnosis not present

## 2022-02-21 DIAGNOSIS — Z79899 Other long term (current) drug therapy: Secondary | ICD-10-CM | POA: Diagnosis not present

## 2022-02-21 DIAGNOSIS — G8918 Other acute postprocedural pain: Secondary | ICD-10-CM | POA: Diagnosis not present

## 2022-02-21 DIAGNOSIS — Z8616 Personal history of COVID-19: Secondary | ICD-10-CM | POA: Diagnosis not present

## 2022-02-21 DIAGNOSIS — Z6838 Body mass index (BMI) 38.0-38.9, adult: Secondary | ICD-10-CM | POA: Diagnosis not present

## 2022-02-21 DIAGNOSIS — F419 Anxiety disorder, unspecified: Secondary | ICD-10-CM | POA: Diagnosis not present

## 2022-02-21 DIAGNOSIS — Z96653 Presence of artificial knee joint, bilateral: Secondary | ICD-10-CM | POA: Diagnosis not present

## 2022-02-21 DIAGNOSIS — E669 Obesity, unspecified: Secondary | ICD-10-CM | POA: Diagnosis not present

## 2022-02-21 DIAGNOSIS — C182 Malignant neoplasm of ascending colon: Secondary | ICD-10-CM | POA: Diagnosis not present

## 2022-02-21 DIAGNOSIS — D121 Benign neoplasm of appendix: Secondary | ICD-10-CM | POA: Diagnosis not present

## 2022-02-21 DIAGNOSIS — K625 Hemorrhage of anus and rectum: Secondary | ICD-10-CM | POA: Diagnosis not present

## 2022-02-21 DIAGNOSIS — K219 Gastro-esophageal reflux disease without esophagitis: Secondary | ICD-10-CM | POA: Diagnosis not present

## 2022-02-21 DIAGNOSIS — K760 Fatty (change of) liver, not elsewhere classified: Secondary | ICD-10-CM | POA: Diagnosis not present

## 2022-02-21 DIAGNOSIS — C180 Malignant neoplasm of cecum: Secondary | ICD-10-CM | POA: Diagnosis not present

## 2022-02-21 DIAGNOSIS — Z7982 Long term (current) use of aspirin: Secondary | ICD-10-CM | POA: Diagnosis not present

## 2022-02-21 DIAGNOSIS — M8580 Other specified disorders of bone density and structure, unspecified site: Secondary | ICD-10-CM | POA: Diagnosis not present

## 2022-02-23 DIAGNOSIS — Z79899 Other long term (current) drug therapy: Secondary | ICD-10-CM | POA: Diagnosis not present

## 2022-02-23 DIAGNOSIS — Z6838 Body mass index (BMI) 38.0-38.9, adult: Secondary | ICD-10-CM | POA: Diagnosis not present

## 2022-02-23 DIAGNOSIS — E785 Hyperlipidemia, unspecified: Secondary | ICD-10-CM | POA: Diagnosis not present

## 2022-02-23 DIAGNOSIS — D638 Anemia in other chronic diseases classified elsewhere: Secondary | ICD-10-CM | POA: Diagnosis not present

## 2022-02-23 DIAGNOSIS — E669 Obesity, unspecified: Secondary | ICD-10-CM | POA: Diagnosis not present

## 2022-02-23 DIAGNOSIS — M1990 Unspecified osteoarthritis, unspecified site: Secondary | ICD-10-CM | POA: Diagnosis not present

## 2022-02-23 DIAGNOSIS — I10 Essential (primary) hypertension: Secondary | ICD-10-CM | POA: Diagnosis not present

## 2022-02-23 DIAGNOSIS — Z7982 Long term (current) use of aspirin: Secondary | ICD-10-CM | POA: Diagnosis not present

## 2022-02-23 DIAGNOSIS — Z96653 Presence of artificial knee joint, bilateral: Secondary | ICD-10-CM | POA: Diagnosis not present

## 2022-02-23 DIAGNOSIS — K625 Hemorrhage of anus and rectum: Secondary | ICD-10-CM | POA: Insufficient documentation

## 2022-02-23 DIAGNOSIS — F419 Anxiety disorder, unspecified: Secondary | ICD-10-CM | POA: Diagnosis not present

## 2022-02-23 DIAGNOSIS — R739 Hyperglycemia, unspecified: Secondary | ICD-10-CM | POA: Insufficient documentation

## 2022-02-25 DIAGNOSIS — C180 Malignant neoplasm of cecum: Secondary | ICD-10-CM | POA: Diagnosis not present

## 2022-02-25 DIAGNOSIS — C772 Secondary and unspecified malignant neoplasm of intra-abdominal lymph nodes: Secondary | ICD-10-CM | POA: Diagnosis not present

## 2022-02-25 DIAGNOSIS — D120 Benign neoplasm of cecum: Secondary | ICD-10-CM | POA: Diagnosis not present

## 2022-03-07 DIAGNOSIS — C772 Secondary and unspecified malignant neoplasm of intra-abdominal lymph nodes: Secondary | ICD-10-CM | POA: Diagnosis not present

## 2022-03-07 DIAGNOSIS — C182 Malignant neoplasm of ascending colon: Secondary | ICD-10-CM | POA: Diagnosis not present

## 2022-03-18 DIAGNOSIS — C182 Malignant neoplasm of ascending colon: Secondary | ICD-10-CM | POA: Diagnosis not present

## 2022-03-18 DIAGNOSIS — Z452 Encounter for adjustment and management of vascular access device: Secondary | ICD-10-CM | POA: Diagnosis not present

## 2022-03-18 DIAGNOSIS — C772 Secondary and unspecified malignant neoplasm of intra-abdominal lymph nodes: Secondary | ICD-10-CM | POA: Diagnosis not present

## 2022-03-21 DIAGNOSIS — E876 Hypokalemia: Secondary | ICD-10-CM | POA: Diagnosis not present

## 2022-03-21 DIAGNOSIS — C182 Malignant neoplasm of ascending colon: Secondary | ICD-10-CM | POA: Diagnosis not present

## 2022-03-21 DIAGNOSIS — Z5111 Encounter for antineoplastic chemotherapy: Secondary | ICD-10-CM | POA: Diagnosis not present

## 2022-03-21 DIAGNOSIS — C772 Secondary and unspecified malignant neoplasm of intra-abdominal lymph nodes: Secondary | ICD-10-CM | POA: Diagnosis not present

## 2022-03-23 DIAGNOSIS — C182 Malignant neoplasm of ascending colon: Secondary | ICD-10-CM | POA: Diagnosis not present

## 2022-03-23 DIAGNOSIS — C772 Secondary and unspecified malignant neoplasm of intra-abdominal lymph nodes: Secondary | ICD-10-CM | POA: Diagnosis not present

## 2022-04-04 DIAGNOSIS — C772 Secondary and unspecified malignant neoplasm of intra-abdominal lymph nodes: Secondary | ICD-10-CM | POA: Diagnosis not present

## 2022-04-04 DIAGNOSIS — C182 Malignant neoplasm of ascending colon: Secondary | ICD-10-CM | POA: Diagnosis not present

## 2022-04-04 DIAGNOSIS — Z5111 Encounter for antineoplastic chemotherapy: Secondary | ICD-10-CM | POA: Diagnosis not present

## 2022-04-06 DIAGNOSIS — C182 Malignant neoplasm of ascending colon: Secondary | ICD-10-CM | POA: Diagnosis not present

## 2022-04-06 DIAGNOSIS — C772 Secondary and unspecified malignant neoplasm of intra-abdominal lymph nodes: Secondary | ICD-10-CM | POA: Diagnosis not present

## 2022-04-18 DIAGNOSIS — C772 Secondary and unspecified malignant neoplasm of intra-abdominal lymph nodes: Secondary | ICD-10-CM | POA: Diagnosis not present

## 2022-04-18 DIAGNOSIS — E876 Hypokalemia: Secondary | ICD-10-CM | POA: Diagnosis not present

## 2022-04-18 DIAGNOSIS — Z5111 Encounter for antineoplastic chemotherapy: Secondary | ICD-10-CM | POA: Diagnosis not present

## 2022-04-18 DIAGNOSIS — D696 Thrombocytopenia, unspecified: Secondary | ICD-10-CM | POA: Diagnosis not present

## 2022-04-18 DIAGNOSIS — K219 Gastro-esophageal reflux disease without esophagitis: Secondary | ICD-10-CM | POA: Diagnosis not present

## 2022-04-18 DIAGNOSIS — C182 Malignant neoplasm of ascending colon: Secondary | ICD-10-CM | POA: Diagnosis not present

## 2022-04-20 DIAGNOSIS — C182 Malignant neoplasm of ascending colon: Secondary | ICD-10-CM | POA: Diagnosis not present

## 2022-04-20 DIAGNOSIS — C772 Secondary and unspecified malignant neoplasm of intra-abdominal lymph nodes: Secondary | ICD-10-CM | POA: Diagnosis not present

## 2022-04-20 DIAGNOSIS — Z95828 Presence of other vascular implants and grafts: Secondary | ICD-10-CM | POA: Diagnosis not present

## 2022-05-02 DIAGNOSIS — C182 Malignant neoplasm of ascending colon: Secondary | ICD-10-CM | POA: Diagnosis not present

## 2022-05-02 DIAGNOSIS — C772 Secondary and unspecified malignant neoplasm of intra-abdominal lymph nodes: Secondary | ICD-10-CM | POA: Diagnosis not present

## 2022-05-02 DIAGNOSIS — Z5111 Encounter for antineoplastic chemotherapy: Secondary | ICD-10-CM | POA: Diagnosis not present

## 2022-05-04 DIAGNOSIS — C772 Secondary and unspecified malignant neoplasm of intra-abdominal lymph nodes: Secondary | ICD-10-CM | POA: Diagnosis not present

## 2022-05-04 DIAGNOSIS — C182 Malignant neoplasm of ascending colon: Secondary | ICD-10-CM | POA: Diagnosis not present

## 2022-05-16 DIAGNOSIS — Z5111 Encounter for antineoplastic chemotherapy: Secondary | ICD-10-CM | POA: Diagnosis not present

## 2022-05-16 DIAGNOSIS — D696 Thrombocytopenia, unspecified: Secondary | ICD-10-CM | POA: Diagnosis not present

## 2022-05-16 DIAGNOSIS — D6481 Anemia due to antineoplastic chemotherapy: Secondary | ICD-10-CM | POA: Diagnosis not present

## 2022-05-16 DIAGNOSIS — C182 Malignant neoplasm of ascending colon: Secondary | ICD-10-CM | POA: Diagnosis not present

## 2022-05-16 DIAGNOSIS — T451X5A Adverse effect of antineoplastic and immunosuppressive drugs, initial encounter: Secondary | ICD-10-CM | POA: Diagnosis not present

## 2022-05-16 DIAGNOSIS — C772 Secondary and unspecified malignant neoplasm of intra-abdominal lymph nodes: Secondary | ICD-10-CM | POA: Diagnosis not present

## 2022-05-16 DIAGNOSIS — E876 Hypokalemia: Secondary | ICD-10-CM | POA: Diagnosis not present

## 2022-05-18 DIAGNOSIS — Z0000 Encounter for general adult medical examination without abnormal findings: Secondary | ICD-10-CM | POA: Diagnosis not present

## 2022-05-18 DIAGNOSIS — C182 Malignant neoplasm of ascending colon: Secondary | ICD-10-CM | POA: Diagnosis not present

## 2022-05-18 DIAGNOSIS — C772 Secondary and unspecified malignant neoplasm of intra-abdominal lymph nodes: Secondary | ICD-10-CM | POA: Diagnosis not present

## 2022-05-18 DIAGNOSIS — E559 Vitamin D deficiency, unspecified: Secondary | ICD-10-CM | POA: Diagnosis not present

## 2022-05-18 DIAGNOSIS — E7800 Pure hypercholesterolemia, unspecified: Secondary | ICD-10-CM | POA: Diagnosis not present

## 2022-05-18 DIAGNOSIS — F419 Anxiety disorder, unspecified: Secondary | ICD-10-CM | POA: Diagnosis not present

## 2022-05-25 DIAGNOSIS — Z1151 Encounter for screening for human papillomavirus (HPV): Secondary | ICD-10-CM | POA: Diagnosis not present

## 2022-05-25 DIAGNOSIS — F419 Anxiety disorder, unspecified: Secondary | ICD-10-CM | POA: Diagnosis not present

## 2022-05-25 DIAGNOSIS — E876 Hypokalemia: Secondary | ICD-10-CM | POA: Diagnosis not present

## 2022-05-25 DIAGNOSIS — Z0000 Encounter for general adult medical examination without abnormal findings: Secondary | ICD-10-CM | POA: Diagnosis not present

## 2022-05-25 DIAGNOSIS — R7301 Impaired fasting glucose: Secondary | ICD-10-CM | POA: Diagnosis not present

## 2022-05-25 DIAGNOSIS — R7989 Other specified abnormal findings of blood chemistry: Secondary | ICD-10-CM | POA: Diagnosis not present

## 2022-05-25 DIAGNOSIS — E7800 Pure hypercholesterolemia, unspecified: Secondary | ICD-10-CM | POA: Diagnosis not present

## 2022-05-25 DIAGNOSIS — E559 Vitamin D deficiency, unspecified: Secondary | ICD-10-CM | POA: Diagnosis not present

## 2022-05-25 DIAGNOSIS — I2510 Atherosclerotic heart disease of native coronary artery without angina pectoris: Secondary | ICD-10-CM | POA: Diagnosis not present

## 2022-05-25 DIAGNOSIS — I10 Essential (primary) hypertension: Secondary | ICD-10-CM | POA: Diagnosis not present

## 2022-05-25 DIAGNOSIS — C182 Malignant neoplasm of ascending colon: Secondary | ICD-10-CM | POA: Diagnosis not present

## 2022-05-25 DIAGNOSIS — Z01419 Encounter for gynecological examination (general) (routine) without abnormal findings: Secondary | ICD-10-CM | POA: Diagnosis not present

## 2022-05-30 DIAGNOSIS — C182 Malignant neoplasm of ascending colon: Secondary | ICD-10-CM | POA: Diagnosis not present

## 2022-05-30 DIAGNOSIS — C772 Secondary and unspecified malignant neoplasm of intra-abdominal lymph nodes: Secondary | ICD-10-CM | POA: Diagnosis not present

## 2022-06-07 DIAGNOSIS — C182 Malignant neoplasm of ascending colon: Secondary | ICD-10-CM | POA: Diagnosis not present

## 2022-06-07 DIAGNOSIS — Z5111 Encounter for antineoplastic chemotherapy: Secondary | ICD-10-CM | POA: Diagnosis not present

## 2022-06-07 DIAGNOSIS — C772 Secondary and unspecified malignant neoplasm of intra-abdominal lymph nodes: Secondary | ICD-10-CM | POA: Diagnosis not present

## 2022-06-09 DIAGNOSIS — C182 Malignant neoplasm of ascending colon: Secondary | ICD-10-CM | POA: Diagnosis not present

## 2022-06-09 DIAGNOSIS — C772 Secondary and unspecified malignant neoplasm of intra-abdominal lymph nodes: Secondary | ICD-10-CM | POA: Diagnosis not present

## 2022-06-14 DIAGNOSIS — F419 Anxiety disorder, unspecified: Secondary | ICD-10-CM | POA: Diagnosis not present

## 2022-06-20 DIAGNOSIS — G620 Drug-induced polyneuropathy: Secondary | ICD-10-CM | POA: Diagnosis not present

## 2022-06-20 DIAGNOSIS — E876 Hypokalemia: Secondary | ICD-10-CM | POA: Diagnosis not present

## 2022-06-20 DIAGNOSIS — D6481 Anemia due to antineoplastic chemotherapy: Secondary | ICD-10-CM | POA: Diagnosis not present

## 2022-06-20 DIAGNOSIS — N200 Calculus of kidney: Secondary | ICD-10-CM | POA: Diagnosis not present

## 2022-06-20 DIAGNOSIS — Z5111 Encounter for antineoplastic chemotherapy: Secondary | ICD-10-CM | POA: Diagnosis not present

## 2022-06-20 DIAGNOSIS — C182 Malignant neoplasm of ascending colon: Secondary | ICD-10-CM | POA: Diagnosis not present

## 2022-06-20 DIAGNOSIS — K760 Fatty (change of) liver, not elsewhere classified: Secondary | ICD-10-CM | POA: Diagnosis not present

## 2022-06-20 DIAGNOSIS — C772 Secondary and unspecified malignant neoplasm of intra-abdominal lymph nodes: Secondary | ICD-10-CM | POA: Diagnosis not present

## 2022-06-20 DIAGNOSIS — T451X5A Adverse effect of antineoplastic and immunosuppressive drugs, initial encounter: Secondary | ICD-10-CM | POA: Diagnosis not present

## 2022-06-20 DIAGNOSIS — D696 Thrombocytopenia, unspecified: Secondary | ICD-10-CM | POA: Diagnosis not present

## 2022-06-20 DIAGNOSIS — N281 Cyst of kidney, acquired: Secondary | ICD-10-CM | POA: Diagnosis not present

## 2022-06-20 DIAGNOSIS — J9811 Atelectasis: Secondary | ICD-10-CM | POA: Diagnosis not present

## 2022-06-21 ENCOUNTER — Other Ambulatory Visit: Payer: Self-pay | Admitting: Internal Medicine

## 2022-06-21 DIAGNOSIS — Z1231 Encounter for screening mammogram for malignant neoplasm of breast: Secondary | ICD-10-CM

## 2022-06-22 DIAGNOSIS — C182 Malignant neoplasm of ascending colon: Secondary | ICD-10-CM | POA: Diagnosis not present

## 2022-06-22 DIAGNOSIS — C772 Secondary and unspecified malignant neoplasm of intra-abdominal lymph nodes: Secondary | ICD-10-CM | POA: Diagnosis not present

## 2022-07-04 DIAGNOSIS — Z5111 Encounter for antineoplastic chemotherapy: Secondary | ICD-10-CM | POA: Diagnosis not present

## 2022-07-04 DIAGNOSIS — Z7952 Long term (current) use of systemic steroids: Secondary | ICD-10-CM | POA: Diagnosis not present

## 2022-07-04 DIAGNOSIS — C182 Malignant neoplasm of ascending colon: Secondary | ICD-10-CM | POA: Diagnosis not present

## 2022-07-04 DIAGNOSIS — C772 Secondary and unspecified malignant neoplasm of intra-abdominal lymph nodes: Secondary | ICD-10-CM | POA: Diagnosis not present

## 2022-07-06 DIAGNOSIS — C182 Malignant neoplasm of ascending colon: Secondary | ICD-10-CM | POA: Diagnosis not present

## 2022-07-06 DIAGNOSIS — C772 Secondary and unspecified malignant neoplasm of intra-abdominal lymph nodes: Secondary | ICD-10-CM | POA: Diagnosis not present

## 2022-07-06 DIAGNOSIS — Z95828 Presence of other vascular implants and grafts: Secondary | ICD-10-CM | POA: Diagnosis not present

## 2022-07-18 DIAGNOSIS — Z5111 Encounter for antineoplastic chemotherapy: Secondary | ICD-10-CM | POA: Diagnosis not present

## 2022-07-18 DIAGNOSIS — E876 Hypokalemia: Secondary | ICD-10-CM | POA: Diagnosis not present

## 2022-07-18 DIAGNOSIS — C182 Malignant neoplasm of ascending colon: Secondary | ICD-10-CM | POA: Diagnosis not present

## 2022-07-18 DIAGNOSIS — C772 Secondary and unspecified malignant neoplasm of intra-abdominal lymph nodes: Secondary | ICD-10-CM | POA: Diagnosis not present

## 2022-07-18 DIAGNOSIS — Z7952 Long term (current) use of systemic steroids: Secondary | ICD-10-CM | POA: Diagnosis not present

## 2022-07-18 DIAGNOSIS — Z79899 Other long term (current) drug therapy: Secondary | ICD-10-CM | POA: Diagnosis not present

## 2022-07-18 DIAGNOSIS — I10 Essential (primary) hypertension: Secondary | ICD-10-CM | POA: Diagnosis not present

## 2022-07-20 DIAGNOSIS — C182 Malignant neoplasm of ascending colon: Secondary | ICD-10-CM | POA: Diagnosis not present

## 2022-07-20 DIAGNOSIS — C772 Secondary and unspecified malignant neoplasm of intra-abdominal lymph nodes: Secondary | ICD-10-CM | POA: Diagnosis not present

## 2022-08-01 DIAGNOSIS — Z7982 Long term (current) use of aspirin: Secondary | ICD-10-CM | POA: Diagnosis not present

## 2022-08-01 DIAGNOSIS — M1990 Unspecified osteoarthritis, unspecified site: Secondary | ICD-10-CM | POA: Diagnosis not present

## 2022-08-01 DIAGNOSIS — Z86010 Personal history of colonic polyps: Secondary | ICD-10-CM | POA: Diagnosis not present

## 2022-08-01 DIAGNOSIS — D6959 Other secondary thrombocytopenia: Secondary | ICD-10-CM | POA: Diagnosis not present

## 2022-08-01 DIAGNOSIS — C772 Secondary and unspecified malignant neoplasm of intra-abdominal lymph nodes: Secondary | ICD-10-CM | POA: Diagnosis not present

## 2022-08-01 DIAGNOSIS — I10 Essential (primary) hypertension: Secondary | ICD-10-CM | POA: Diagnosis not present

## 2022-08-01 DIAGNOSIS — D6481 Anemia due to antineoplastic chemotherapy: Secondary | ICD-10-CM | POA: Diagnosis not present

## 2022-08-01 DIAGNOSIS — C182 Malignant neoplasm of ascending colon: Secondary | ICD-10-CM | POA: Diagnosis not present

## 2022-08-01 DIAGNOSIS — Z5111 Encounter for antineoplastic chemotherapy: Secondary | ICD-10-CM | POA: Diagnosis not present

## 2022-08-01 DIAGNOSIS — Z9049 Acquired absence of other specified parts of digestive tract: Secondary | ICD-10-CM | POA: Diagnosis not present

## 2022-08-01 DIAGNOSIS — Z79899 Other long term (current) drug therapy: Secondary | ICD-10-CM | POA: Diagnosis not present

## 2022-08-01 DIAGNOSIS — E785 Hyperlipidemia, unspecified: Secondary | ICD-10-CM | POA: Diagnosis not present

## 2022-08-01 DIAGNOSIS — T451X5A Adverse effect of antineoplastic and immunosuppressive drugs, initial encounter: Secondary | ICD-10-CM | POA: Diagnosis not present

## 2022-08-01 DIAGNOSIS — G620 Drug-induced polyneuropathy: Secondary | ICD-10-CM | POA: Diagnosis not present

## 2022-08-01 DIAGNOSIS — E876 Hypokalemia: Secondary | ICD-10-CM | POA: Diagnosis not present

## 2022-08-03 DIAGNOSIS — C182 Malignant neoplasm of ascending colon: Secondary | ICD-10-CM | POA: Diagnosis not present

## 2022-08-03 DIAGNOSIS — C772 Secondary and unspecified malignant neoplasm of intra-abdominal lymph nodes: Secondary | ICD-10-CM | POA: Diagnosis not present

## 2022-08-15 DIAGNOSIS — C182 Malignant neoplasm of ascending colon: Secondary | ICD-10-CM | POA: Diagnosis not present

## 2022-08-15 DIAGNOSIS — C772 Secondary and unspecified malignant neoplasm of intra-abdominal lymph nodes: Secondary | ICD-10-CM | POA: Diagnosis not present

## 2022-08-15 DIAGNOSIS — Z5111 Encounter for antineoplastic chemotherapy: Secondary | ICD-10-CM | POA: Diagnosis not present

## 2022-08-16 DIAGNOSIS — M1909 Primary osteoarthritis, other specified site: Secondary | ICD-10-CM | POA: Diagnosis not present

## 2022-08-16 DIAGNOSIS — Z9049 Acquired absence of other specified parts of digestive tract: Secondary | ICD-10-CM | POA: Diagnosis not present

## 2022-08-16 DIAGNOSIS — M5430 Sciatica, unspecified side: Secondary | ICD-10-CM | POA: Diagnosis not present

## 2022-08-16 DIAGNOSIS — C189 Malignant neoplasm of colon, unspecified: Secondary | ICD-10-CM | POA: Diagnosis not present

## 2022-08-17 DIAGNOSIS — C182 Malignant neoplasm of ascending colon: Secondary | ICD-10-CM | POA: Diagnosis not present

## 2022-08-17 DIAGNOSIS — C772 Secondary and unspecified malignant neoplasm of intra-abdominal lymph nodes: Secondary | ICD-10-CM | POA: Diagnosis not present

## 2022-08-29 DIAGNOSIS — E876 Hypokalemia: Secondary | ICD-10-CM | POA: Diagnosis not present

## 2022-08-29 DIAGNOSIS — C182 Malignant neoplasm of ascending colon: Secondary | ICD-10-CM | POA: Diagnosis not present

## 2022-08-29 DIAGNOSIS — G620 Drug-induced polyneuropathy: Secondary | ICD-10-CM | POA: Diagnosis not present

## 2022-08-29 DIAGNOSIS — D6481 Anemia due to antineoplastic chemotherapy: Secondary | ICD-10-CM | POA: Diagnosis not present

## 2022-08-29 DIAGNOSIS — D6959 Other secondary thrombocytopenia: Secondary | ICD-10-CM | POA: Diagnosis not present

## 2022-08-29 DIAGNOSIS — Z5111 Encounter for antineoplastic chemotherapy: Secondary | ICD-10-CM | POA: Diagnosis not present

## 2022-08-29 DIAGNOSIS — Z9049 Acquired absence of other specified parts of digestive tract: Secondary | ICD-10-CM | POA: Diagnosis not present

## 2022-08-29 DIAGNOSIS — E785 Hyperlipidemia, unspecified: Secondary | ICD-10-CM | POA: Diagnosis not present

## 2022-08-29 DIAGNOSIS — C772 Secondary and unspecified malignant neoplasm of intra-abdominal lymph nodes: Secondary | ICD-10-CM | POA: Diagnosis not present

## 2022-08-29 DIAGNOSIS — T451X5A Adverse effect of antineoplastic and immunosuppressive drugs, initial encounter: Secondary | ICD-10-CM | POA: Diagnosis not present

## 2022-08-31 DIAGNOSIS — C772 Secondary and unspecified malignant neoplasm of intra-abdominal lymph nodes: Secondary | ICD-10-CM | POA: Diagnosis not present

## 2022-08-31 DIAGNOSIS — C182 Malignant neoplasm of ascending colon: Secondary | ICD-10-CM | POA: Diagnosis not present

## 2022-09-12 DIAGNOSIS — E876 Hypokalemia: Secondary | ICD-10-CM | POA: Diagnosis not present

## 2022-09-14 DIAGNOSIS — H52203 Unspecified astigmatism, bilateral: Secondary | ICD-10-CM | POA: Diagnosis not present

## 2022-09-14 DIAGNOSIS — Z961 Presence of intraocular lens: Secondary | ICD-10-CM | POA: Diagnosis not present

## 2022-09-28 ENCOUNTER — Ambulatory Visit
Admission: RE | Admit: 2022-09-28 | Discharge: 2022-09-28 | Disposition: A | Discharge status: Home / Self Care | Payer: PPO | Source: Ambulatory Visit | Attending: Internal Medicine | Admitting: Internal Medicine

## 2022-09-28 DIAGNOSIS — Z1231 Encounter for screening mammogram for malignant neoplasm of breast: Secondary | ICD-10-CM

## 2022-10-05 DIAGNOSIS — E876 Hypokalemia: Secondary | ICD-10-CM | POA: Diagnosis not present

## 2022-10-05 DIAGNOSIS — C182 Malignant neoplasm of ascending colon: Secondary | ICD-10-CM | POA: Diagnosis not present

## 2022-10-20 DIAGNOSIS — C182 Malignant neoplasm of ascending colon: Secondary | ICD-10-CM | POA: Diagnosis not present

## 2022-10-20 DIAGNOSIS — C772 Secondary and unspecified malignant neoplasm of intra-abdominal lymph nodes: Secondary | ICD-10-CM | POA: Diagnosis not present

## 2022-11-29 DIAGNOSIS — T451X5A Adverse effect of antineoplastic and immunosuppressive drugs, initial encounter: Secondary | ICD-10-CM | POA: Diagnosis not present

## 2022-11-29 DIAGNOSIS — E876 Hypokalemia: Secondary | ICD-10-CM | POA: Diagnosis not present

## 2022-11-29 DIAGNOSIS — G629 Polyneuropathy, unspecified: Secondary | ICD-10-CM | POA: Diagnosis not present

## 2022-11-29 DIAGNOSIS — D696 Thrombocytopenia, unspecified: Secondary | ICD-10-CM | POA: Diagnosis not present

## 2022-11-29 DIAGNOSIS — C182 Malignant neoplasm of ascending colon: Secondary | ICD-10-CM | POA: Diagnosis not present

## 2022-11-29 DIAGNOSIS — C772 Secondary and unspecified malignant neoplasm of intra-abdominal lymph nodes: Secondary | ICD-10-CM | POA: Diagnosis not present

## 2022-11-30 DIAGNOSIS — I10 Essential (primary) hypertension: Secondary | ICD-10-CM | POA: Diagnosis not present

## 2022-11-30 DIAGNOSIS — E7800 Pure hypercholesterolemia, unspecified: Secondary | ICD-10-CM | POA: Diagnosis not present

## 2022-11-30 DIAGNOSIS — B001 Herpesviral vesicular dermatitis: Secondary | ICD-10-CM | POA: Diagnosis not present

## 2022-11-30 DIAGNOSIS — I2510 Atherosclerotic heart disease of native coronary artery without angina pectoris: Secondary | ICD-10-CM | POA: Diagnosis not present

## 2022-11-30 DIAGNOSIS — E559 Vitamin D deficiency, unspecified: Secondary | ICD-10-CM | POA: Diagnosis not present

## 2022-11-30 DIAGNOSIS — C182 Malignant neoplasm of ascending colon: Secondary | ICD-10-CM | POA: Diagnosis not present

## 2022-11-30 DIAGNOSIS — F419 Anxiety disorder, unspecified: Secondary | ICD-10-CM | POA: Diagnosis not present

## 2022-12-14 DIAGNOSIS — G5602 Carpal tunnel syndrome, left upper limb: Secondary | ICD-10-CM | POA: Diagnosis not present

## 2022-12-29 DIAGNOSIS — M25632 Stiffness of left wrist, not elsewhere classified: Secondary | ICD-10-CM | POA: Diagnosis not present

## 2023-01-09 ENCOUNTER — Ambulatory Visit: Payer: PPO | Admitting: Podiatry

## 2023-01-09 ENCOUNTER — Encounter: Payer: Self-pay | Admitting: Podiatry

## 2023-01-09 DIAGNOSIS — Z95828 Presence of other vascular implants and grafts: Secondary | ICD-10-CM | POA: Insufficient documentation

## 2023-01-09 DIAGNOSIS — B351 Tinea unguium: Secondary | ICD-10-CM | POA: Diagnosis not present

## 2023-01-09 DIAGNOSIS — L600 Ingrowing nail: Secondary | ICD-10-CM | POA: Diagnosis not present

## 2023-01-09 DIAGNOSIS — L603 Nail dystrophy: Secondary | ICD-10-CM | POA: Diagnosis not present

## 2023-01-09 MED ORDER — NEOMYCIN-POLYMYXIN-HC 1 % OT SOLN: OTIC | 1 refills | Status: AC

## 2023-01-09 NOTE — Progress Notes (Signed)
Subjective:  Patient ID: Anne [name], female    DOB: 05-19-1956,  MRN: [id number] HPI Chief Complaint  Patient presents with   Toe Pain    Hallux right - medial border, tender x few months, gets regular pedicures and sometimes it feels better afterwards, concerned the nail is more yellow than the others, history of cancer/chemo treatments   New Patient (Initial Visit)    67 y.o. female presents with the above complaint.   ROS: Denies fever chills nausea vomit muscle aches pains calf pain back pain chest pain shortness of breath.  Past Medical History:  Diagnosis Date   Anxiety    Arthritis    Cataract    hx of   Chronic low back pain    Deviated septum    Dyslipidemia    GERD (gastroesophageal reflux disease)    occ   History of hiatal hernia    Hypertension    Legally blind    Right eye   Major depression    Migraines    Morbid obesity    Pneumonia    Past Surgical History:  Procedure Laterality Date   CATARACT EXTRACTION, BILATERAL     CHOLECYSTECTOMY     COLONOSCOPY WITH PROPOFOL N/A 11/12/2013   Procedure: COLONOSCOPY WITH PROPOFOL;  Surgeon: Charolett BumpersMartin K Johnson, MD;  Location: [facility];  Service: Endoscopy;  Laterality: N/A;   IR EMBO VENOUS NOT HEMORR HEMANG  INC GUIDE ROADMAPPING  12/03/2019   lower back surgery     RETINAL DETACHMENT SURGERY Right    x2   right arm tendon repair  2005   ROTATOR CUFF REPAIR Bilateral    both sholulders   TOTAL KNEE ARTHROPLASTY Left 12/26/2016   Procedure: LEFT TOTAL KNEE ARTHROPLASTY;  Surgeon: Ollen GrossFrank Aluisio, MD;  Location: [facility];  Service: Orthopedics;  Laterality: Left;  requests 50mins   TOTAL KNEE ARTHROPLASTY Right 11/05/2018   Procedure: TOTAL KNEE ARTHROPLASTY;  Surgeon: Ollen GrossAluisio, Frank, MD;  Location: [facility];  Service: Orthopedics;  Laterality: Right;  50min    Current Outpatient Medications:    NEOMYCIN-POLYMYXIN-HYDROCORTISONE (CORTISPORIN) 1 % SOLN OTIC solution, Apply 1-2 drops to toe BID after soaking, Disp: 10  mL, Rfl: 1   ALPRAZolam (XANAX) 1 MG tablet, Take 1 mg by mouth at bedtime as needed for sleep., Disp: , Rfl:    atorvastatin (LIPITOR) 40 MG tablet, Take 1 tablet (40 mg total) by mouth at bedtime., Disp: 90 tablet, Rfl: 3   buPROPion (WELLBUTRIN XL) 150 MG 24 hr tablet, Take 150 mg by mouth daily., Disp: , Rfl:    calcium carbonate (TUMS - DOSED IN MG ELEMENTAL CALCIUM) 500 MG chewable tablet, Chew 1-2 tablets by mouth daily as needed for indigestion or heartburn., Disp: , Rfl:    Calcium Carbonate-Vitamin D (CALTRATE 600+D PO), Take 1 tablet by mouth daily., Disp: , Rfl:    Carboxymethylcell-Hypromellose (GENTEAL OP), Apply 1 drop to eye 3 (three) times daily as needed (dry eyes)., Disp: , Rfl:    Feverfew 380 MG CAPS, Take 380 mg by mouth daily., Disp: , Rfl:    Multiple Vitamin (MULTIVITAMIN WITH MINERALS) TABS tablet, Take 1 tablet by mouth daily., Disp: , Rfl:    potassium chloride SA (K-DUR,KLOR-CON) 20 MEQ tablet, Take 20 mEq by mouth at bedtime. , Disp: , Rfl: 3   triamterene-hydrochlorothiazide (MAXZIDE) 75-50 MG per tablet, Take 0.5 tablets by mouth daily. , Disp: , Rfl:   No Known Allergies Review of Systems Objective:  There were no vitals  filed for this visit.  General: Well developed, nourished, in no acute distress, alert and oriented x3   Dermatological: Skin is warm, dry and supple bilateral. Nails x 10 are well maintained; remaining integument appears unremarkable at this time. There are no open sores, no preulcerative lesions, no rash or signs of infection present.  Vascular: Dorsalis Pedis artery and Posterior Tibial artery pedal pulses are 2/4 bilateral with immedate capillary fill time. Pedal hair growth present. No varicosities and no lower extremity edema present bilateral.   Neruologic: Grossly intact via light touch bilateral. Vibratory intact via tuning fork bilateral. Protective threshold with Semmes Wienstein monofilament intact to all pedal sites bilateral.  Patellar and Achilles deep tendon reflexes 2+ bilateral. No Babinski or clonus noted bilateral.   Musculoskeletal: No gross boney pedal deformities bilateral. No pain, crepitus, or limitation noted with foot and ankle range of motion bilateral. Muscular strength 5/5 in all groups tested bilateral.  Gait: Unassisted, Nonantalgic.    Radiographs:  None taken  Assessment & Plan:   Assessment: Nail dystrophy possible onychomycosis ingrown toenail tibial border hallux right.  Hallux valgus.  Plan: Debridement of toenails 1 through 5 right foot.  Chemical matricectomy tibial border hallux right.  Sample was sent for pathologic evaluation.  She was given both oral and written home-going instruction for the care and soaking of the toe as well as a prescription for Cortisporin Otic to be applied twice daily after soaking.  Like to follow-up with her in a few weeks.     Anne [name], [place]

## 2023-01-09 NOTE — Patient Instructions (Signed)

## 2023-01-20 DIAGNOSIS — C189 Malignant neoplasm of colon, unspecified: Secondary | ICD-10-CM | POA: Diagnosis not present

## 2023-01-31 ENCOUNTER — Encounter: Payer: Self-pay | Admitting: Podiatry

## 2023-01-31 ENCOUNTER — Ambulatory Visit: Payer: PPO | Admitting: Podiatry

## 2023-01-31 DIAGNOSIS — Z9889 Other specified postprocedural states: Secondary | ICD-10-CM

## 2023-01-31 DIAGNOSIS — L600 Ingrowing nail: Secondary | ICD-10-CM

## 2023-01-31 DIAGNOSIS — F419 Anxiety disorder, unspecified: Secondary | ICD-10-CM | POA: Diagnosis not present

## 2023-01-31 DIAGNOSIS — E559 Vitamin D deficiency, unspecified: Secondary | ICD-10-CM | POA: Diagnosis not present

## 2023-01-31 DIAGNOSIS — I2510 Atherosclerotic heart disease of native coronary artery without angina pectoris: Secondary | ICD-10-CM | POA: Diagnosis not present

## 2023-01-31 NOTE — Progress Notes (Signed)
She presents today for follow-up of her nail check hallux right tibial border.  States that she is doing just great with this no problems whatsoever.  Objective: Vital signs have alert and oriented x 3.  There is no erythema edema salines drainage odor margin appears to be healing very nicely hallux right.  Assessment: Healing surgical toe.  Plan: Follow-up with me as needed

## 2023-02-08 DIAGNOSIS — Z1211 Encounter for screening for malignant neoplasm of colon: Secondary | ICD-10-CM | POA: Diagnosis not present

## 2023-02-08 DIAGNOSIS — C180 Malignant neoplasm of cecum: Secondary | ICD-10-CM | POA: Diagnosis not present

## 2023-02-13 DIAGNOSIS — C189 Malignant neoplasm of colon, unspecified: Secondary | ICD-10-CM | POA: Diagnosis not present

## 2023-02-13 DIAGNOSIS — Z9049 Acquired absence of other specified parts of digestive tract: Secondary | ICD-10-CM | POA: Diagnosis not present

## 2023-02-28 DIAGNOSIS — C182 Malignant neoplasm of ascending colon: Secondary | ICD-10-CM | POA: Diagnosis not present

## 2023-02-28 DIAGNOSIS — C772 Secondary and unspecified malignant neoplasm of intra-abdominal lymph nodes: Secondary | ICD-10-CM | POA: Diagnosis not present

## 2023-05-10 DIAGNOSIS — C182 Malignant neoplasm of ascending colon: Secondary | ICD-10-CM | POA: Diagnosis not present

## 2023-05-10 DIAGNOSIS — C772 Secondary and unspecified malignant neoplasm of intra-abdominal lymph nodes: Secondary | ICD-10-CM | POA: Diagnosis not present

## 2023-06-01 ENCOUNTER — Ambulatory Visit
Admission: EM | Admit: 2023-06-01 | Discharge: 2023-06-01 | Disposition: A | Discharge status: Home / Self Care | Payer: PPO | Attending: Internal Medicine | Admitting: Internal Medicine

## 2023-06-01 ENCOUNTER — Encounter: Payer: Self-pay | Admitting: Emergency Medicine

## 2023-06-01 DIAGNOSIS — Z1152 Encounter for screening for COVID-19: Secondary | ICD-10-CM | POA: Insufficient documentation

## 2023-06-01 DIAGNOSIS — R051 Acute cough: Secondary | ICD-10-CM

## 2023-06-01 DIAGNOSIS — R059 Cough, unspecified: Secondary | ICD-10-CM | POA: Diagnosis present

## 2023-06-01 DIAGNOSIS — J069 Acute upper respiratory infection, unspecified: Secondary | ICD-10-CM | POA: Insufficient documentation

## 2023-06-01 LAB — SARS CORONAVIRUS 2 (TAT 6-24 HRS): SARS Coronavirus 2: NEGATIVE

## 2023-06-01 MED ORDER — FLUCONAZOLE 150 MG PO TABS: 150.0000 mg | ORAL_TABLET | Freq: Every day | ORAL | 0 refills | Status: DC

## 2023-06-01 MED ORDER — BENZONATATE 100 MG PO CAPS: 100.0000 mg | ORAL_CAPSULE | Freq: Three times a day (TID) | ORAL | 0 refills | Status: AC | PRN

## 2023-06-01 MED ORDER — DOXYCYCLINE HYCLATE 100 MG PO CAPS: 100.0000 mg | ORAL_CAPSULE | Freq: Two times a day (BID) | ORAL | 0 refills | Status: AC

## 2023-06-01 NOTE — ED Provider Notes (Signed)
[name]    CSN: [id number] Arrival date & time: 06/01/23  1050      History   Chief Complaint Chief Complaint  Patient presents with   Cough   Nasal Congestion   Headache    HPI Anne [name] is a 67 y.o. female.   Patient presents with 6-day history of cough and nasal congestion.  Reports Tmax at home was 99.  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  Denies chest pain or shortness of breath.  Denies history of asthma or COPD.  Patient has taken Mucinex DM and Robitussin with minimal improvement in symptoms.  She does not smoke cigarettes.   Cough Headache   Past Medical History:  Diagnosis Date   Anxiety    Arthritis    Cataract    hx of   Chronic low back pain    Deviated septum    Dyslipidemia    GERD (gastroesophageal reflux disease)    occ   History of hiatal hernia    Hypertension    Legally blind    Right eye   Major depression    Migraines    Morbid obesity (HCC)    Pneumonia     Patient Active Problem List   Diagnosis Date Noted   Port-A-Cath in place 01/09/2023   Anemia of chronic disease 02/23/2022   Blood per rectum 02/23/2022   BRBPR (bright red blood per rectum) 02/23/2022   Hyperglycemia 02/23/2022   Anxiety 02/09/2022   Class 2 obesity with body mass index (BMI) of 38.0 to 38.9 in adult 02/09/2022   Elevated coronary artery calcium score 02/09/2022   Hyperlipidemia 02/09/2022   Hypertension 02/09/2022   Hypokalemia 02/09/2022   Cancer of ascending colon metastatic to intra-abdominal lymph node (HCC) 01/27/2022   Arthritis of carpometacarpal (CMC) joint of both thumbs 05/05/2021   Bilateral carpal tunnel syndrome 05/05/2021   Pain in right foot 03/05/2019   Stiffness of right knee 11/12/2018   History of total left knee replacement 12/26/2017   Tear of left rotator cuff 11/30/2017   Acute pain of left shoulder 11/14/2017   OA (osteoarthritis) of knee  12/26/2016    Past Surgical History:  Procedure Laterality Date   CATARACT EXTRACTION, BILATERAL     CHOLECYSTECTOMY     COLONOSCOPY WITH PROPOFOL N/A 11/12/2013   Procedure: COLONOSCOPY WITH PROPOFOL;  Surgeon: Charolett Bumpers, MD;  Location: [facility];  Service: Endoscopy;  Laterality: N/A;   IR EMBO VENOUS NOT HEMORR HEMANG  INC GUIDE ROADMAPPING  12/03/2019   lower back surgery     RETINAL DETACHMENT SURGERY Right    x2   right arm tendon repair  2005   ROTATOR CUFF REPAIR Bilateral    both sholulders   TOTAL KNEE ARTHROPLASTY Left 12/26/2016   Procedure: LEFT TOTAL KNEE ARTHROPLASTY;  Surgeon: Ollen Gross, MD;  Location: [facility];  Service: Orthopedics;  Laterality: Left;  requests   TOTAL KNEE ARTHROPLASTY Right 11/05/2018   Procedure: TOTAL KNEE ARTHROPLASTY;  Surgeon: Ollen Gross, MD;  Location: [facility];  Service: Orthopedics;  Laterality: Right;     OB History     Gravida  0   Para      Term      Preterm      AB      Living         SAB      IAB  Ectopic      Multiple      Live Births               Home Medications    Prior to Admission medications   Medication Sig Start Date End Date Taking? Authorizing Provider  benzonatate (TESSALON) 100 MG capsule Take 1 capsule (100 mg total) by mouth every 8 (eight) hours as needed for cough. 06/01/23  Yes Medora Roorda, Acie Fredrickson, FNP  doxycycline (VIBRAMYCIN) 100 MG capsule Take 1 capsule (100 mg total) by mouth 2 (two) times daily for 7 days. 06/01/23 06/08/23 Yes Matina Rodier, Acie Fredrickson, FNP  fluconazole (DIFLUCAN) 150 MG tablet Take 1 tablet (150 mg total) by mouth daily. Take at first sign of vaginal yeast. 06/01/23  Yes Draylen Lobue, Acie Fredrickson, FNP  ALPRAZolam Prudy Feeler) 1 MG tablet Take 1 mg by mouth at bedtime as needed for sleep.    [provider]  atorvastatin (LIPITOR) 40 MG tablet Take 1 tablet (40 mg total) by mouth at bedtime. 02/08/22   Maisie Fus, MD  buPROPion (WELLBUTRIN XL) 150 MG 24 hr tablet  Take 150 mg by mouth daily.    [provider]  calcium carbonate (TUMS - DOSED IN MG ELEMENTAL CALCIUM) 500 MG chewable tablet Chew 1-2 tablets by mouth daily as needed for indigestion or heartburn.    [provider]  Calcium Carbonate-Vitamin D (CALTRATE 600+D PO) Take 1 tablet by mouth daily.    [provider]  Carboxymethylcell-Hypromellose (GENTEAL OP) Apply 1 drop to eye 3 (three) times daily as needed (dry eyes).    [provider]  Feverfew 380 MG CAPS Take 380 mg by mouth daily.    [provider]  Multiple Vitamin (MULTIVITAMIN WITH MINERALS) TABS tablet Take 1 tablet by mouth daily.    [provider]  NEOMYCIN-POLYMYXIN-HYDROCORTISONE (CORTISPORIN) 1 % SOLN OTIC solution Apply 1-2 drops to toe BID after soaking 01/09/23   Hyatt, Max T, DPM  potassium chloride SA (K-DUR,KLOR-CON) 20 MEQ tablet Take 20 mEq by mouth at bedtime.  12/12/16   [provider]  triamterene-hydrochlorothiazide (MAXZIDE) 75-50 MG per tablet Take 0.5 tablets by mouth daily.     [provider]    Family History Family History  Problem Relation Age of Onset   Diabetes Mother    Hypertension Mother    Heart attack Father    Cancer Father        lung    Social History Social History   Tobacco Use   Smoking status: Never   Smokeless tobacco: Never  Vaping Use   Vaping status: Never Used  Substance Use Topics   Alcohol use: Yes    Comment: very rare use   Drug use: No     Allergies   Patient has no known allergies.   Review of Systems Review of Systems Per HPI  Physical Exam Triage Vital Signs ED Triage Vitals [06/01/23 1100]  Encounter Vitals Group     BP 118/75     Systolic BP Percentile      Diastolic BP Percentile      Pulse Rate 91     Resp 18     Temp 98.4 F (36.9 C)     Temp Source Oral     SpO2 95 %     Weight      Height      Head Circumference      Peak Flow      Pain Score 0  Pain Loc       Pain Education      Exclude from Growth Chart    No data found.  Updated Vital Signs BP 118/75 (BP Location: Left Arm)   Pulse 91   Temp 98.4 F (36.9 C) (Oral)   Resp 18   LMP  (LMP Unknown)   SpO2 95%   Visual Acuity Right Eye Distance:   Left Eye Distance:   Bilateral Distance:    Right Eye Near:   Left Eye Near:    Bilateral Near:     Physical Exam Constitutional:      General: She is not in acute distress.    Appearance: Normal appearance. She is not toxic-appearing or diaphoretic.  HENT:     Head: Normocephalic and atraumatic.     Right Ear: Tympanic membrane and ear canal normal.     Left Ear: Tympanic membrane and ear canal normal.     Nose: Congestion present.     Mouth/Throat:     Mouth: Mucous membranes are moist.     Pharynx: Posterior oropharyngeal erythema present.  Eyes:     Extraocular Movements: Extraocular movements intact.     Conjunctiva/sclera: Conjunctivae normal.     Pupils: Pupils are equal, round, and reactive to light.  Cardiovascular:     Rate and Rhythm: Normal rate and regular rhythm.     Pulses: Normal pulses.     Heart sounds: Normal heart sounds.  Pulmonary:     Effort: Pulmonary effort is normal. No respiratory distress.     Breath sounds: Normal breath sounds. No stridor. No wheezing, rhonchi or rales.  Abdominal:     General: Abdomen is flat. Bowel sounds are normal.     Palpations: Abdomen is soft.  Musculoskeletal:        General: Normal range of motion.     Cervical back: Normal range of motion.  Skin:    General: Skin is warm and dry.  Neurological:     General: No focal deficit present.     Mental Status: She is alert and oriented to person, place, and time. Mental status is at baseline.  Psychiatric:        Mood and Affect: Mood normal.        Behavior: Behavior normal.      UC Treatments / Results  Labs (all labs ordered are listed, but only abnormal results are displayed) Labs Reviewed  SARS CORONAVIRUS 2  (TAT 6-24 HRS)    EKG   Radiology No results found.  Procedures Procedures (including critical care time)  Medications Ordered in UC Medications - No data to display  Initial Impression / Assessment and Plan / UC Course  I have reviewed the triage vital signs and the nursing notes.  Pertinent labs & imaging results that were available during my care of the patient were reviewed by me and considered in my medical decision making (see chart for details).     I highly suspect that patient has a persistent viral illness but given symptoms are not improving with OTC medications and given duration of symptoms, will opt to treat for secondary bacterial infection with doxycycline antibiotic today.  Benzonatate prescribed to take as needed for cough as well.  There are no adventitious lung sounds on exam so do not think that chest imaging is necessary.  COVID test pending.  Patient will be outside the treatment window for COVID antivirals if COVID test is positive.  Patient requesting Diflucan given antibiotics typically  give her yeast infection so Diflucan was sent to take at first sign of vaginal yeast.  Encouraged to avoid atorvastatin temporarily if she needs to take Diflucan.  Advised strict return precautions.  Patient verbalized understanding and was agreeable with plan. Final Clinical Impressions(s) / UC Diagnoses   Final diagnoses:  Acute upper respiratory infection  Acute cough     Discharge Instructions      I have prescribed you an antibiotic, cough medication, Diflucan.  COVID test is pending.    ED Prescriptions     Medication Sig Dispense Auth. Provider   doxycycline (VIBRAMYCIN) 100 MG capsule Take 1 capsule (100 mg total) by mouth 2 (two) times daily for 7 days. 14 capsule [place], [place] E, [place]   benzonatate (TESSALON) 100 MG capsule Take 1 capsule (100 mg total) by mouth every 8 (eight) hours as needed for cough. 21 capsule [place], [place] E, [place]   fluconazole  (DIFLUCAN) 150 MG tablet Take 1 tablet (150 mg total) by mouth daily. Take at first sign of vaginal yeast. 1 tablet [place], Acie Fredrickson, [place]      PDMP not reviewed this encounter.   Gustavus Bryant, [place] 06/01/23 1122

## 2023-06-01 NOTE — Discharge Instructions (Signed)
I have prescribed you an antibiotic, cough medication, Diflucan.  COVID test is pending.

## 2023-06-01 NOTE — ED Triage Notes (Addendum)
Since last Friday pt had headaches, cough, congestion, fevers intermittently, sore throat, body aches. Taken Mucinex DM and Robitussin as well as Tylenol.  Pt had two home covid tests that were negative

## 2023-06-06 DIAGNOSIS — E559 Vitamin D deficiency, unspecified: Secondary | ICD-10-CM | POA: Diagnosis not present

## 2023-06-06 DIAGNOSIS — R7989 Other specified abnormal findings of blood chemistry: Secondary | ICD-10-CM | POA: Diagnosis not present

## 2023-06-06 DIAGNOSIS — R7301 Impaired fasting glucose: Secondary | ICD-10-CM | POA: Diagnosis not present

## 2023-06-06 DIAGNOSIS — I2510 Atherosclerotic heart disease of native coronary artery without angina pectoris: Secondary | ICD-10-CM | POA: Diagnosis not present

## 2023-06-06 DIAGNOSIS — E7800 Pure hypercholesterolemia, unspecified: Secondary | ICD-10-CM | POA: Diagnosis not present

## 2023-06-07 LAB — LAB REPORT - SCANNED
A1c: 6
EGFR (Non-African Amer.): 67
HPV, high-risk: NEGATIVE

## 2023-06-13 DIAGNOSIS — Z0000 Encounter for general adult medical examination without abnormal findings: Secondary | ICD-10-CM | POA: Diagnosis not present

## 2023-06-13 DIAGNOSIS — R748 Abnormal levels of other serum enzymes: Secondary | ICD-10-CM | POA: Diagnosis not present

## 2023-06-13 DIAGNOSIS — F419 Anxiety disorder, unspecified: Secondary | ICD-10-CM | POA: Diagnosis not present

## 2023-06-13 DIAGNOSIS — I2510 Atherosclerotic heart disease of native coronary artery without angina pectoris: Secondary | ICD-10-CM | POA: Diagnosis not present

## 2023-06-13 DIAGNOSIS — I10 Essential (primary) hypertension: Secondary | ICD-10-CM | POA: Diagnosis not present

## 2023-06-13 DIAGNOSIS — E7800 Pure hypercholesterolemia, unspecified: Secondary | ICD-10-CM | POA: Diagnosis not present

## 2023-06-13 DIAGNOSIS — Z23 Encounter for immunization: Secondary | ICD-10-CM | POA: Diagnosis not present

## 2023-06-13 DIAGNOSIS — E559 Vitamin D deficiency, unspecified: Secondary | ICD-10-CM | POA: Diagnosis not present

## 2023-06-13 DIAGNOSIS — C182 Malignant neoplasm of ascending colon: Secondary | ICD-10-CM | POA: Diagnosis not present

## 2023-08-04 ENCOUNTER — Other Ambulatory Visit: Payer: Self-pay | Admitting: Internal Medicine

## 2023-08-04 DIAGNOSIS — Z1231 Encounter for screening mammogram for malignant neoplasm of breast: Secondary | ICD-10-CM

## 2023-08-14 DIAGNOSIS — C182 Malignant neoplasm of ascending colon: Secondary | ICD-10-CM | POA: Diagnosis not present

## 2023-08-14 DIAGNOSIS — C772 Secondary and unspecified malignant neoplasm of intra-abdominal lymph nodes: Secondary | ICD-10-CM | POA: Diagnosis not present

## 2023-09-04 DIAGNOSIS — C182 Malignant neoplasm of ascending colon: Secondary | ICD-10-CM | POA: Diagnosis not present

## 2023-09-04 DIAGNOSIS — C772 Secondary and unspecified malignant neoplasm of intra-abdominal lymph nodes: Secondary | ICD-10-CM | POA: Diagnosis not present

## 2023-09-15 DIAGNOSIS — C772 Secondary and unspecified malignant neoplasm of intra-abdominal lymph nodes: Secondary | ICD-10-CM | POA: Diagnosis not present

## 2023-09-15 DIAGNOSIS — Z452 Encounter for adjustment and management of vascular access device: Secondary | ICD-10-CM | POA: Diagnosis not present

## 2023-09-15 DIAGNOSIS — C182 Malignant neoplasm of ascending colon: Secondary | ICD-10-CM | POA: Diagnosis not present

## 2023-09-22 DIAGNOSIS — Z961 Presence of intraocular lens: Secondary | ICD-10-CM | POA: Diagnosis not present

## 2023-09-22 DIAGNOSIS — H53001 Unspecified amblyopia, right eye: Secondary | ICD-10-CM | POA: Diagnosis not present

## 2023-09-22 DIAGNOSIS — H52203 Unspecified astigmatism, bilateral: Secondary | ICD-10-CM | POA: Diagnosis not present

## 2023-10-02 ENCOUNTER — Ambulatory Visit
Admission: RE | Admit: 2023-10-02 | Discharge: 2023-10-02 | Disposition: A | Discharge status: Home / Self Care | Payer: PPO | Source: Ambulatory Visit | Attending: Internal Medicine | Admitting: Internal Medicine

## 2023-10-02 DIAGNOSIS — Z1231 Encounter for screening mammogram for malignant neoplasm of breast: Secondary | ICD-10-CM | POA: Diagnosis not present

## 2023-10-03 IMAGING — CT CT CARDIAC CORONARY ARTERY CALCIUM SCORE
3 series · 14 of 20 positions shown, 16 images · non-contrast
Comparison: 10/26/2003

CLINICAL DATA: Pure hypercholesterolemia

EXAM:
CT CARDIAC CORONARY ARTERY CALCIUM SCORE
TECHNIQUE: Non-contrast imaging through the heart was performed using
prospective ECG gating. Image post processing was performed on an
independent workstation, allowing for quantitative analysis of the
heart and coronary arteries. Note that this exam targets the heart
and the chest was not imaged in its entirety.

[Series 2: calcium scoring 2.00 qr36 bestdiast 70% hrt calciu · axial · 0.45mm/px · z∈[+1618,+1702]mm · 4 of 71 slices shown]
[im 15/71  vessel]
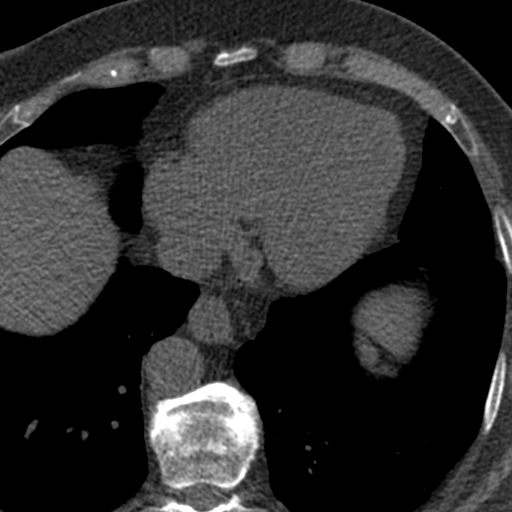
[im 29/71  vessel]
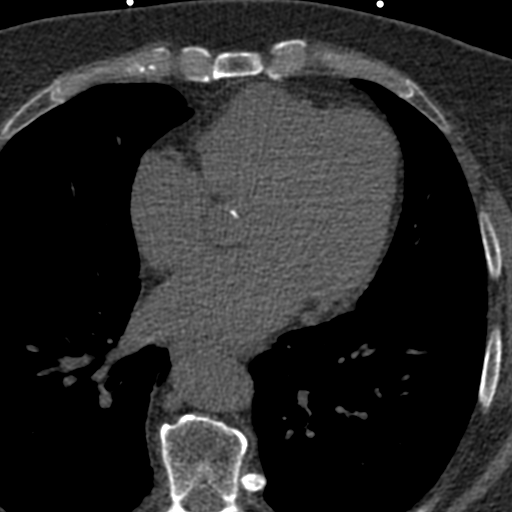
[im 43/71  vessel]
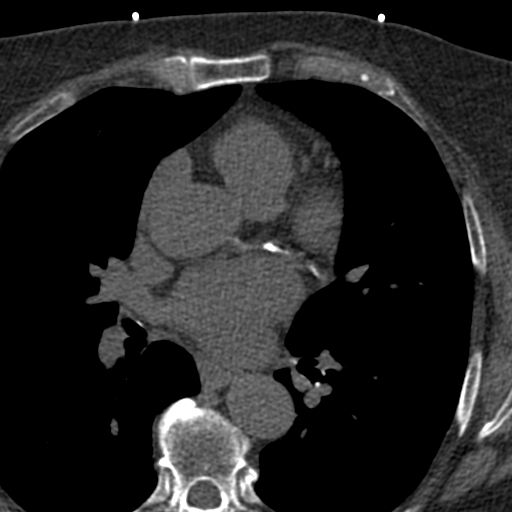
[im 57/71  vessel]
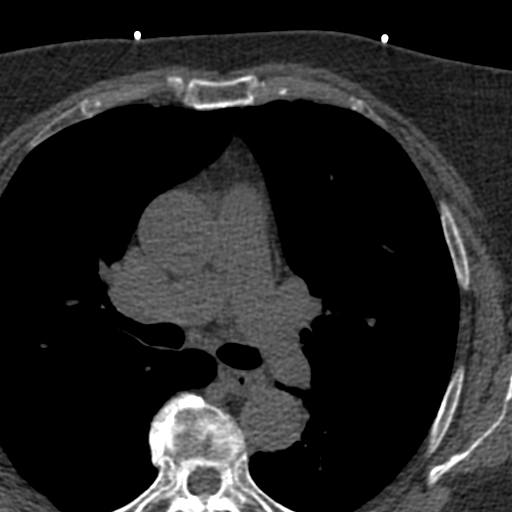

[Series 3: calcium scoring 2.00 br40 bestdiast 70% axial · axial · 0.64mm/px · z∈[+1612,+1706]mm · 5 of 71 slices shown, 7 images]
[im 12/71  vessel]
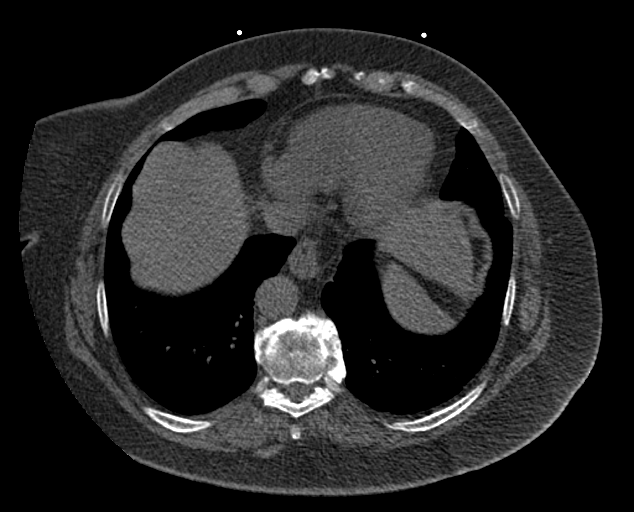
[im 12/71  lung]
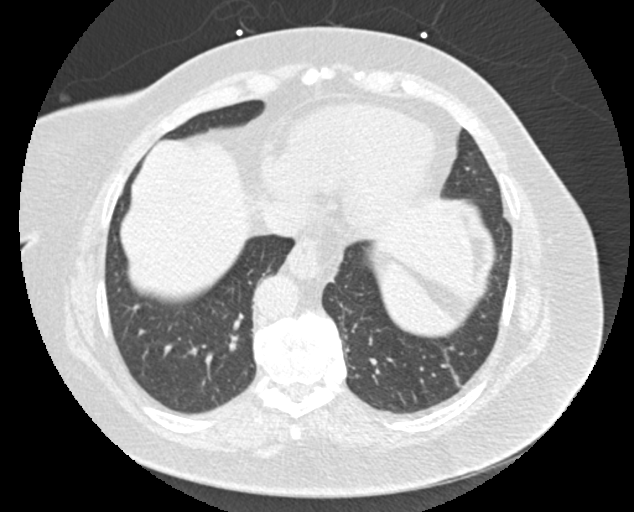
[im 24/71  vessel]
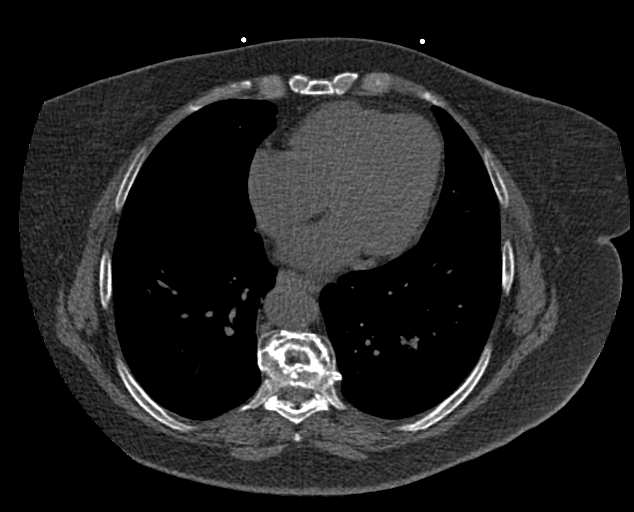
[im 36/71  vessel]
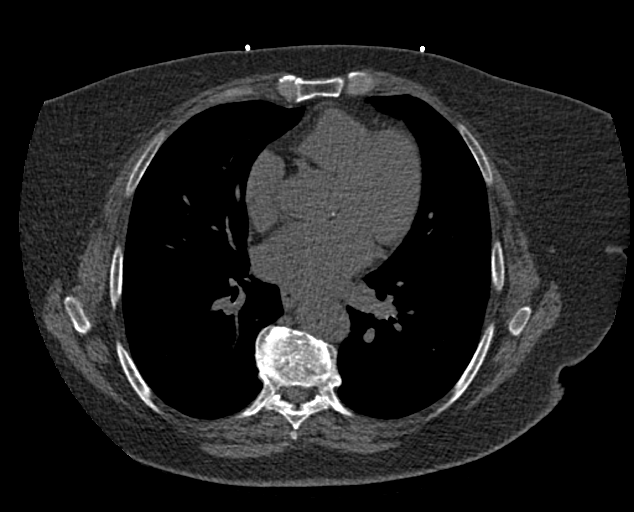
[im 47/71  vessel]
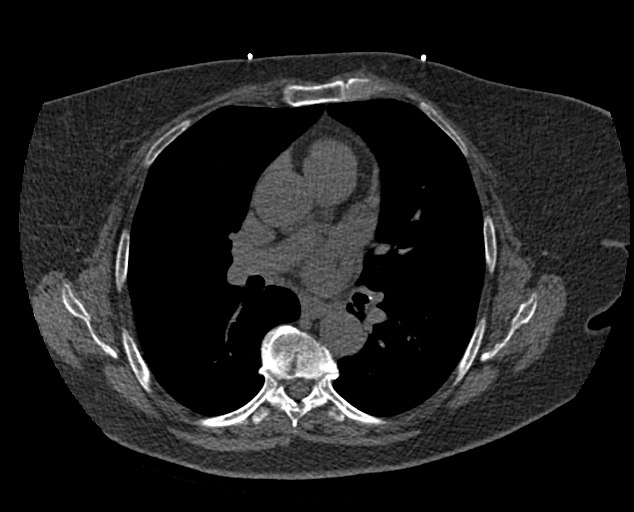
[im 59/71  vessel]
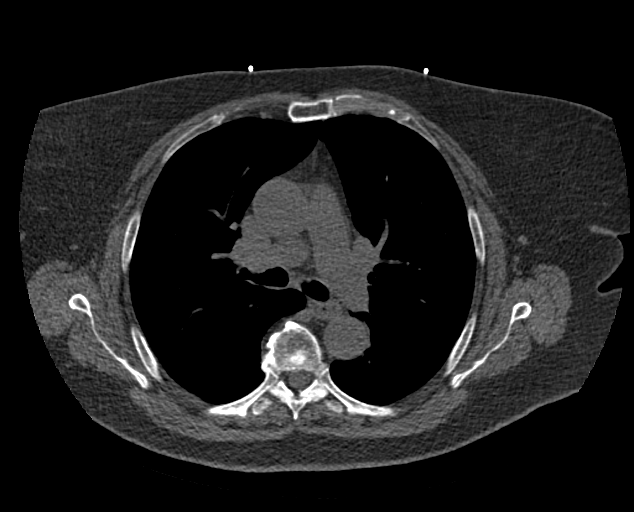
[im 59/71  lung]
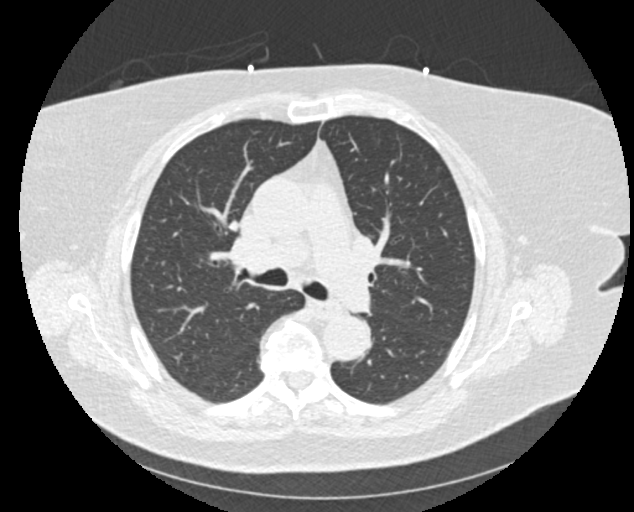

[Series 9: calcium scoring 2.00 br60 bestdiast 70% lungs · axial · 0.64mm/px · z∈[+1612,+1706]mm · 5 of 71 slices shown]
[im 12/71  vessel]
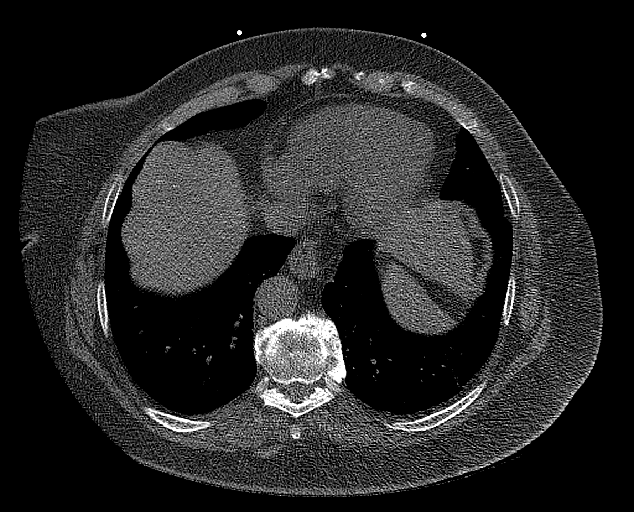
[im 24/71  vessel]
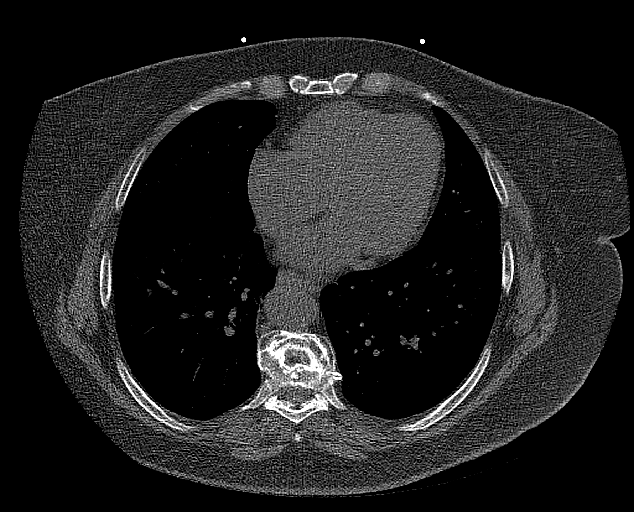
[im 36/71  vessel]
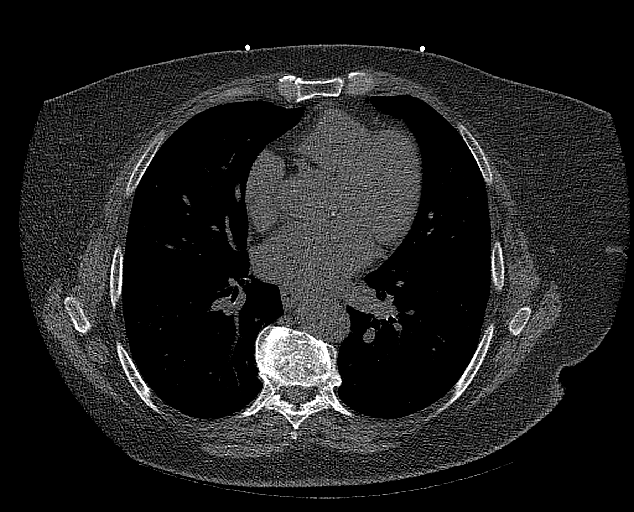
[im 47/71  vessel]
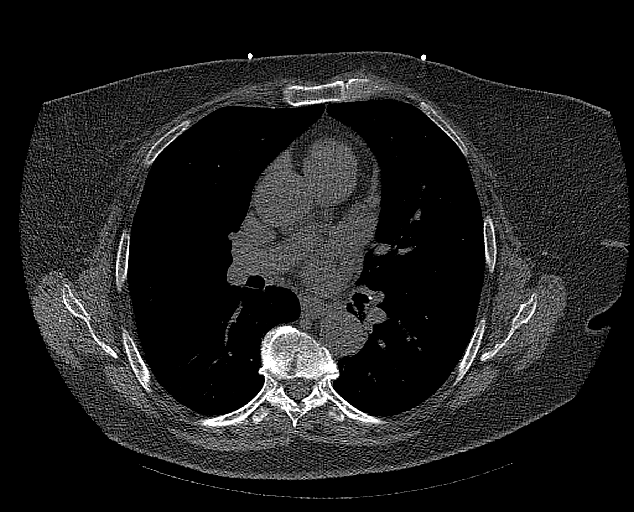
[im 59/71  vessel]
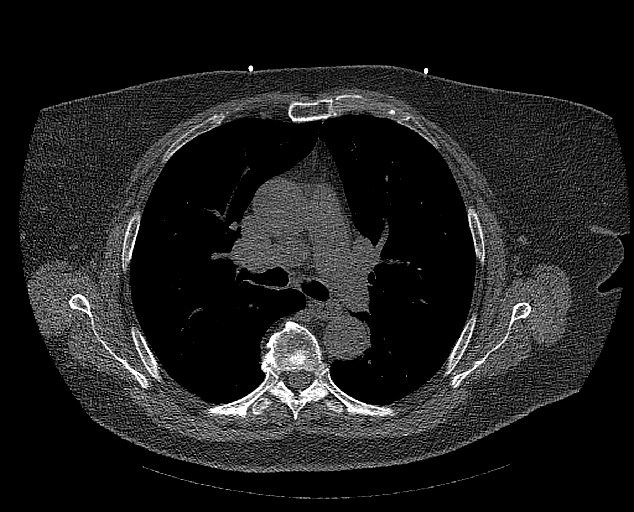

[14 of 20 positions shown; findings below may reference images not displayed]

FINDINGS: CORONARY CALCIUM SCORES:

Left Main:

LAD: 204

LCx: 239

RCA: 191

Total Agatston Score: 662

[HOSPITAL] percentile: 97

AORTA MEASUREMENTS:

Ascending Aorta: 34 mm

Descending Aorta: 25 mm

OTHER FINDINGS:

Heart is normal size. Aorta normal caliber. Scattered aortic
calcifications. No adenopathy. No confluent airspace opacities or
effusions. Linear scarring at the left lung base. No acute findings
in the upper abdomen. Chest wall soft tissues are unremarkable. No
acute bony abnormality.
IMPRESSION: Total Agatston score: 662

[HOSPITAL] percentile: 97

Scattered aortic atherosclerosis.

No acute extra cardiac abnormality.

## 2023-12-07 DIAGNOSIS — I2510 Atherosclerotic heart disease of native coronary artery without angina pectoris: Secondary | ICD-10-CM | POA: Diagnosis not present

## 2023-12-13 DIAGNOSIS — B001 Herpesviral vesicular dermatitis: Secondary | ICD-10-CM | POA: Diagnosis not present

## 2023-12-13 DIAGNOSIS — I2510 Atherosclerotic heart disease of native coronary artery without angina pectoris: Secondary | ICD-10-CM | POA: Diagnosis not present

## 2023-12-13 DIAGNOSIS — I10 Essential (primary) hypertension: Secondary | ICD-10-CM | POA: Diagnosis not present

## 2023-12-13 DIAGNOSIS — E7800 Pure hypercholesterolemia, unspecified: Secondary | ICD-10-CM | POA: Diagnosis not present

## 2023-12-13 DIAGNOSIS — E559 Vitamin D deficiency, unspecified: Secondary | ICD-10-CM | POA: Diagnosis not present

## 2023-12-13 DIAGNOSIS — R7303 Prediabetes: Secondary | ICD-10-CM | POA: Diagnosis not present

## 2024-01-08 ENCOUNTER — Other Ambulatory Visit: Payer: Self-pay

## 2024-01-08 ENCOUNTER — Emergency Department (HOSPITAL_COMMUNITY)
Admission: EM | Admit: 2024-01-08 | Discharge: 2024-01-09 | Disposition: A | Discharge status: Home / Self Care | Attending: Emergency Medicine | Admitting: Emergency Medicine

## 2024-01-08 ENCOUNTER — Encounter (HOSPITAL_COMMUNITY): Payer: Self-pay

## 2024-01-08 DIAGNOSIS — I10 Essential (primary) hypertension: Secondary | ICD-10-CM | POA: Insufficient documentation

## 2024-01-08 DIAGNOSIS — R197 Diarrhea, unspecified: Secondary | ICD-10-CM | POA: Diagnosis not present

## 2024-01-08 LAB — COMPREHENSIVE METABOLIC PANEL WITH GFR
ALT: 24 U/L (ref 0–44)
AST: 28 U/L (ref 15–41)
Albumin: 4 g/dL (ref 3.5–5.0)
Alkaline Phosphatase: 77 U/L (ref 38–126)
Anion gap: 13 (ref 5–15)
BUN: 18 mg/dL (ref 8–23)
CO2: 22 mmol/L (ref 22–32)
Calcium: 8.7 mg/dL — ABNORMAL LOW (ref 8.9–10.3)
Chloride: 106 mmol/L (ref 98–111)
Creatinine, Ser: 0.85 mg/dL (ref 0.44–1.00)
GFR, Estimated: >60 mL/min (ref >60)
Glucose, Bld: 103 mg/dL — ABNORMAL HIGH (ref 70–99)
Potassium: 3.3 mmol/L — ABNORMAL LOW (ref 3.5–5.1)
Sodium: 141 mmol/L (ref 135–145)
Total Bilirubin: 0.6 mg/dL (ref 0.0–1.2)
Total Protein: 6.5 g/dL (ref 6.5–8.1)

## 2024-01-08 LAB — CBC
HCT: 40.8 % (ref 36.0–46.0)
Hemoglobin: 13.9 g/dL (ref 12.0–15.0)
MCH: 31.4 pg (ref 26.0–34.0)
MCHC: 34.1 g/dL (ref 30.0–36.0)
MCV: 92.1 fL (ref 80.0–100.0)
Platelets: 191 K/uL (ref 150–400)
RBC: 4.43 MIL/uL (ref 3.87–5.11)
RDW: 13.7 % (ref 11.5–15.5)
WBC: 6.4 K/uL (ref 4.0–10.5)
nRBC: 0 % (ref 0.0–0.2)

## 2024-01-08 LAB — URINALYSIS, ROUTINE W REFLEX MICROSCOPIC
Bilirubin Urine: NEGATIVE
Glucose, UA: NEGATIVE mg/dL
Hgb urine dipstick: NEGATIVE
Ketones, ur: NEGATIVE mg/dL
Leukocytes,Ua: NEGATIVE
Nitrite: NEGATIVE
Protein, ur: NEGATIVE mg/dL
Specific Gravity, Urine: 1.015 (ref 1.005–1.030)
pH: 6 (ref 5.0–8.0)

## 2024-01-08 LAB — LIPASE, BLOOD: Lipase: 34 U/L (ref 11–51)

## 2024-01-08 NOTE — ED Triage Notes (Addendum)
 Patient reports diarrhea since last Thursday, Saturday and Sunday it was worse, finally took imodium yesterday and today with relief, but after eating lunch the diarrhea came back. Patient denies fever, abdominal pain, cramping, bloody or tarry stools, or recent antibiotic use. Does have hx of colon cancer.

## 2024-01-09 NOTE — ED Provider Notes (Signed)
 [facility] DEPT [facility] Emergency Department Provider Note MRN:  [id number]  Arrival date & time: 01/09/24     Chief Complaint   Diarrhea   History of Present Illness   Anne [name] is a 68 y.o. year-old female with a history of hypertension presenting to the ED with chief complaint of diarrhea.  Diarrhea for the past 2 to 3 days.  Was hesitant to use Imodium but decided to use some and it helped.  Today diarrhea returned after eating a sandwich, here for evaluation.  Since arriving in the emergency department 8 hours ago has had no return of symptoms, feels fine.  Review of Systems  A thorough review of systems was obtained and all systems are negative except as noted in the HPI and PMH.   Patient's Health History    Past Medical History:  Diagnosis Date   Anxiety    Arthritis    Cataract    hx of   Chronic low back pain    Deviated septum    Dyslipidemia    GERD (gastroesophageal reflux disease)    occ   History of hiatal hernia    Hypertension    Legally blind    Right eye   Major depression    Migraines    Morbid obesity (HCC)    Pneumonia     Past Surgical History:  Procedure Laterality Date   CATARACT EXTRACTION, BILATERAL     CHOLECYSTECTOMY     COLONOSCOPY WITH PROPOFOL N/A 11/12/2013   Procedure: COLONOSCOPY WITH PROPOFOL;  Surgeon: Charolett Bumpers, MD;  Location: [facility];  Service: Endoscopy;  Laterality: N/A;   IR EMBO VENOUS NOT HEMORR HEMANG  INC GUIDE ROADMAPPING  12/03/2019   lower back surgery     RETINAL DETACHMENT SURGERY Right    x2   right arm tendon repair  2005   ROTATOR CUFF REPAIR Bilateral    both sholulders   TOTAL KNEE ARTHROPLASTY Left 12/26/2016   Procedure: LEFT TOTAL KNEE ARTHROPLASTY;  Surgeon: Ollen Gross, MD;  Location: [facility];  Service: Orthopedics;  Laterality: Left;  requests   TOTAL KNEE ARTHROPLASTY Right 11/05/2018   Procedure: TOTAL KNEE ARTHROPLASTY;  Surgeon: Ollen Gross, MD;  Location: [facility];   Service: Orthopedics;  Laterality: Right;     Family History  Problem Relation Age of Onset   Diabetes Mother    Hypertension Mother    Heart attack Father    Cancer Father        lung    Social History   Socioeconomic History   Marital status: Single    Spouse name: Not on file   Number of children: Not on file   Years of education: Not on file   Highest education level: Not on file  Occupational History   Not on file  Tobacco Use   Smoking status: Never   Smokeless tobacco: Never  Vaping Use   Vaping status: Never Used  Substance and Sexual Activity   Alcohol use: Yes    Comment: very rare use   Drug use: No   Sexual activity: Not on file  Other Topics Concern   Not on file  Social History Narrative   Not on file   Social Drivers of Health   Financial Resource Strain: Low Risk  (11/29/2022)   Received from [facility], [facility]   Overall Financial Resource Strain (CARDIA)    Difficulty of Paying Living Expenses: Not hard at all  Food Insecurity:  No Food Insecurity (11/29/2022)   Received from [facility], [facility]   Hunger Vital Sign    Worried About Running Out of Food in the Last Year: Never true    Ran Out of Food in the Last Year: Never true  Transportation Needs: No Transportation Needs (11/29/2022)   Received from [facility], [facility]   [facility] - Transportation    Lack of Transportation (Medical): No    Lack of Transportation (Non-Medical): No  Physical Activity: Inactive (10/02/2022)   Received from [facility], [facility]   Exercise Vital Sign    Days of Exercise per Week: 0 days    Minutes of Exercise per Session: 10 min  Stress: No Stress Concern Present (10/02/2022)   Received from [facility], [facility] - Occupational Stress Questionnaire    Feeling of Stress : Only a little  Social Connections: Socially Integrated (10/02/2022)   Received from [facility], [facility]   Social Network    How would you rate your social network (family, work, friends)?: Good participation with social networks  Intimate Partner Violence: Not At Risk (10/02/2022)   Received from [facility], [facility]   HITS    Over the last 12 months how often did your partner physically hurt you?: Never    Over the last 12 months how often did your partner insult you or talk down to you?: Never    Over the last 12 months how often did your partner threaten you with physical harm?: Never    Over the last 12 months how often did your partner scream or curse at you?: Never     Physical Exam   Vitals:   01/08/24 2000 01/09/24 0104  BP: 132/63 122/66  Pulse: 72 70  Resp: 18 16  Temp: 97.7 F (36.5 C) 97.9 F (36.6 C)  SpO2: 98% 97%    CONSTITUTIONAL: Well-appearing, NAD NEURO/PSYCH:  Alert and oriented x 3, no focal deficits EYES:  eyes equal and reactive ENT/NECK:  no LAD, no JVD CARDIO: Regular rate, well-perfused, normal S1 and S2 PULM:  CTAB no wheezing or rhonchi GI/GU:  non-distended, non-tender MSK/SPINE:  No gross deformities, no edema SKIN:  no rash, atraumatic   *Additional and/or pertinent findings included in MDM below  Diagnostic and Interventional Summary    EKG Interpretation Date/Time:    Ventricular Rate:    PR Interval:    QRS Duration:    QT Interval:    QTC Calculation:   R Axis:      Text Interpretation:         Labs Reviewed  COMPREHENSIVE METABOLIC PANEL WITH GFR - Abnormal; Notable for the following components:      Result Value   Potassium 3.3 (*)    Glucose, Bld 103 (*)    Calcium 8.7 (*)    All other components within normal limits  LIPASE, BLOOD  CBC  URINALYSIS, ROUTINE W REFLEX MICROSCOPIC    No orders to display    Medications - No data to display   Procedures  /  Critical Care Procedures  ED Course and Medical Decision Making  Initial Impression and Ddx Suspect likely viral diarrheal process, reassuring  vital signs, currently without symptoms over the past 8 hours.  Not having any abdominal pain, abdomen is completely soft and nontender with no rebound guarding or rigidity.  No fever.  Past medical/surgical history that increases complexity of ED encounter: Hypertension  Interpretation  of Diagnostics I personally reviewed the Laboratory Testing and my interpretation is as follows: No significant blood count or electrolyte disturbance.    Patient Reassessment and Ultimate Disposition/Management     No indication for further testing or admission, appropriate for discharge  Patient management required discussion with the following services or consulting groups:  None  Complexity of Problems Addressed Acute illness or injury that poses threat of life of bodily function  Additional Data Reviewed and Analyzed Further history obtained from: None  Additional Factors Impacting ED Encounter Risk None  Elmer Sow. Pilar Plate, MD [facility] Emergency Medicine [facility] [email or web address]  Final Clinical Impressions(s) / ED Diagnoses     ICD-10-CM   1. Diarrhea of presumed infectious origin  R19.7       ED Discharge Orders     None        Discharge Instructions Discussed with and Provided to Patient:     Discharge Instructions      You were evaluated in the Emergency Department and after careful evaluation, we did not find any emergent condition requiring admission or further testing in the hospital.  Your exam/testing today is overall reassuring.  Continue Imodium as needed, plenty of fluids.  Please return to the Emergency Department if you experience any worsening of your condition.   Thank you for allowing [place] to be a part of your care.       Sabas Sous, MD 01/09/24 [phone]

## 2024-01-09 NOTE — Discharge Instructions (Signed)
 You were evaluated in the Emergency Department and after careful evaluation, we did not find any emergent condition requiring admission or further testing in the hospital.  Your exam/testing today is overall reassuring.  Continue Imodium as needed, plenty of fluids.  Please return to the Emergency Department if you experience any worsening of your condition.   Thank you for allowing [place] to be a part of your care.

## 2024-02-28 DIAGNOSIS — C182 Malignant neoplasm of ascending colon: Secondary | ICD-10-CM | POA: Diagnosis not present

## 2024-02-28 DIAGNOSIS — C772 Secondary and unspecified malignant neoplasm of intra-abdominal lymph nodes: Secondary | ICD-10-CM | POA: Diagnosis not present

## 2024-03-04 DIAGNOSIS — C772 Secondary and unspecified malignant neoplasm of intra-abdominal lymph nodes: Secondary | ICD-10-CM | POA: Diagnosis not present

## 2024-03-04 DIAGNOSIS — C182 Malignant neoplasm of ascending colon: Secondary | ICD-10-CM | POA: Diagnosis not present

## 2024-05-01 ENCOUNTER — Ambulatory Visit: Attending: Registered Nurse

## 2024-05-01 VITALS — BP 132/84 | HR 79

## 2024-05-01 DIAGNOSIS — R2681 Unsteadiness on feet: Secondary | ICD-10-CM | POA: Diagnosis not present

## 2024-05-01 DIAGNOSIS — R42 Dizziness and giddiness: Secondary | ICD-10-CM | POA: Diagnosis not present

## 2024-05-01 NOTE — Therapy (Signed)
 OUTPATIENT PHYSICAL THERAPY VESTIBULAR EVALUATION     Patient Name: Anne [name] MRN: [id number] DOB:04/20/1956, 68 y.o., female Today's Date: 05/01/2024  END OF SESSION:  PT End of Session - 05/01/24 0842     Visit Number 1    Number of Visits 9    Date for PT Re-Evaluation 05/31/24    Authorization Type HTA    PT Start Time 0846    PT Stop Time 0932    PT Time Calculation (min) 46 min    Activity Tolerance Patient tolerated treatment well    Behavior During Therapy WFL for tasks assessed/performed          Past Medical History:  Diagnosis Date   Anxiety    Arthritis    Cataract    hx of   Chronic low back pain    Deviated septum    Dyslipidemia    GERD (gastroesophageal reflux disease)    occ   History of hiatal hernia    Hypertension    Legally blind    Right eye   Major depression    Migraines    Morbid obesity (HCC)    Pneumonia    Past Surgical History:  Procedure Laterality Date   CATARACT EXTRACTION, BILATERAL     CHOLECYSTECTOMY     COLONOSCOPY WITH PROPOFOL  N/A 11/12/2013   Procedure: COLONOSCOPY WITH PROPOFOL ;  Surgeon: Gladis MARLA Louder, MD;  [facility];  Service: Endoscopy;  Laterality: N/A;   IR EMBO VENOUS NOT HEMORR HEMANG  INC GUIDE ROADMAPPING  12/03/2019   lower back surgery     RETINAL DETACHMENT SURGERY Right    x2   right arm tendon repair  2005   ROTATOR CUFF REPAIR Bilateral    both sholulders   TOTAL KNEE ARTHROPLASTY Left 12/26/2016   Procedure: LEFT TOTAL KNEE ARTHROPLASTY;  Surgeon: Dempsey Moan, MD;  Location[facility];  Service: Orthopedics;  Laterality: Left;  requests   TOTAL KNEE ARTHROPLASTY Right 11/05/2018   Procedure: TOTAL KNEE ARTHROPLASTY;  Surgeon: Moan Dempsey, MD;  Location: [facility];  Service: Orthopedics;  Laterality: Right;    Patient Active Problem List   Diagnosis Date Noted   Port-A-Cath in place 01/09/2023   Anemia of chronic disease 02/23/2022   Blood per rectum 02/23/2022   BRBPR  (bright red blood per rectum) 02/23/2022   Hyperglycemia 02/23/2022   Anxiety 02/09/2022   Class 2 obesity with body mass index (BMI) of 38.0 to 38.9 in adult 02/09/2022   Elevated coronary artery calcium  score 02/09/2022   Hyperlipidemia 02/09/2022   Hypertension 02/09/2022   Hypokalemia 02/09/2022   Cancer of ascending colon metastatic to intra-abdominal lymph node (HCC) 01/27/2022   Arthritis of carpometacarpal (CMC) joint of both thumbs 05/05/2021   Bilateral carpal tunnel syndrome 05/05/2021   Pain in right foot 03/05/2019   Stiffness of right knee 11/12/2018   History of total left knee replacement 12/26/2017   Tear of left rotator cuff 11/30/2017   Acute pain of left shoulder 11/14/2017   OA (osteoarthritis) of knee 12/26/2016    PCP: Ryan Hives, MD REFERRING PROVIDER: Ronal Leeroy Marie, FNP  REFERRING DIAG: R42 (ICD-10-CM) - Vertigo   THERAPY DIAG:  Unsteadiness on feet - Plan: PT plan of care cert/re-cert  Dizziness and giddiness - Plan: PT plan of care cert/re-cert  ONSET DATE: 04/19/24 referral   Rationale for Evaluation and Treatment: Rehabilitation  SUBJECTIVE:   SUBJECTIVE STATEMENT: Patient arrives to clinic alone, no AD. Last 2 weeks  of June she started experiencing vertigo. This has impacted her ability to work. Went to a [place] and this helped, per patient. Moving quickly or bending down will bring it on. This is slightly better, but some of it remains. Has not seen ENT yet. Per patient, chiropractor told her she had a tear in her intestines.  Pt accompanied by: self  PERTINENT HISTORY: anxiety, cataracts, LBP, GERD, HTN, R eye legally blind, migraines  PAIN:  Are you having pain? No  PRECAUTIONS: Fall R eye legally blind   WEIGHT BEARING RESTRICTIONS: No  FALLS: Has patient fallen in last 6 months? No  LIVING ENVIRONMENT: Lives with: lives alone Lives in: House/apartment Stairs: Yes: External: 8 steps; can reach both Has following  equipment at home: Walker - 2 wheeled and shower chair  PLOF: Independent  PATIENT GOALS: to not be dizzy   OBJECTIVE:  Note: Objective measures were completed at Evaluation unless otherwise noted.  DIAGNOSTIC FINDINGS: none relevant   COGNITION: Overall cognitive status: Within functional limits for tasks assessed   SENSATION: WFL  Cervical ROM:   WFL, pain free  STRENGTH: WFL  BED MOBILITY:  Reports independent, intermittent dizziness with bed mobility, namely rolling R  GAIT: Gait pattern: WFL   VESTIBULAR ASSESSMENT:  GENERAL OBSERVATION: NAD, no AD   SYMPTOM BEHAVIOR:  Subjective history: see above  Non-Vestibular symptoms: none  Type of dizziness: Spinning/Vertigo  Frequency: intermittent   Duration: seconds  Aggravating factors: Induced by position change: lying supine, rolling to the right, rolling to the left, and supine to sit  Relieving factors: rest  Progression of symptoms: better  OCULOMOTOR EXAM:  Ocular Alignment: abnormal R eye legally blind; progressive lenses   Ocular ROM: No Limitations  Spontaneous Nystagmus: absent  Gaze-Induced Nystagmus: absent  Smooth Pursuits: intact  Saccades: intact  Convergence/Divergence: ~10 cm R eye not converging   VESTIBULAR - OCULAR REFLEX:   Slow VOR: Normal  VOR Cancellation: Normal  Head-Impulse Test: HIT Right: slow HIT Left: negative   POSITIONAL TESTING: Right Dix-Hallpike: upbeating, right nystagmus Left Dix-Hallpike: to be assessed Right Roll Test: no nystagmus Left Roll Test: no nystagmus  MOTION SENSITIVITY:  Motion Sensitivity Quotient Intensity: 0 = none, 1 = Lightheaded, 2 = Mild, 3 = Moderate, 4 = Severe, 5 = Vomiting  Intensity  1. Sitting to supine 0  2. Supine to L side 0  3. Supine to R side 2  4. Supine to sitting 1  5. L Hallpike-Dix   6. Up from L    7. R Hallpike-Dix 4  8. Up from R  0  9. Sitting, head tipped to L knee   10. Head up from L knee   11. Sitting, head  tipped to R knee   12. Head up from R knee   13. Sitting head turns x5   14.Sitting head nods x5   15. In stance, 180 turn to L    16. In stance, 180 turn to R  TREATMENT  Theract: -pathophys of BPPV, risk factors -brandt daroff  Canalith Repositioning:  Epley Right: Number of Reps: 2, Response to Treatment: symptoms improved, and Comment: L torsional upbeating nystagmus noted in position 2 (possibly evidence of multi-canal BPPV?)   PATIENT EDUCATION: Education details: PT POC, exam findings, HEP Person educated: Patient Education method: Explanation, Demonstration, and Handouts Education comprehension: verbalized understanding and needs further education  HOME EXERCISE PROGRAM: Sit to Side-Lying    Sit on edge of bed. 1. Turn head 45 to right. 2. Maintain head position and lie down slowly on left side. Hold until symptoms subside. 3. Sit up slowly. Hold until symptoms subside. 4. Turn head 45 to left. 5. Maintain head position and lie down slowly on right side. Hold until symptoms subside. 6. Sit up slowly. Repeat sequence ___3_ times per session. Do __3__ sessions per day. GOALS: Goals reviewed with patient? Yes  SHORT TERM GOALS: = LTG based on PT POC length   LONG TERM GOALS: Target date: 05/31/24  Pt will be independent with final HEP for improved symptoms  Baseline: to be updated Goal status: INITIAL  2.  Patient will demonstrate (-) positional testing to indicate resolution of BPPV  Baseline: at least R posterior canal canalithiasis  Goal status: INITIAL   ASSESSMENT:  CLINICAL IMPRESSION: Patient is a 68 y.o. female who was seen today for physical therapy evaluation and treatment for dizziness. She has a remote history of, what sounds like, a brief episode of BPPV. Today, she presents with a (+) R dix hallpike with ~5-7s delayed  onset, low amplitude R torsional upbeating nystagmus that fatigued after ~20s. Patient with subjective report of spinning. Treated with R Epley x2 with good results. In position 2 of Epley, patient noted to have brief L torsional upbeating nystagmus, possibly indicative of L posterior canal canalithiasis as well (multi-canal) or an atypical presentation of current R posterior canal canalithiasis mid treatment. She will benefit from skilled PT services to address the above mentioned deficits.    OBJECTIVE IMPAIRMENTS: dizziness.   ACTIVITY LIMITATIONS: bed mobility and locomotion level  PARTICIPATION LIMITATIONS: community activity and occupation  PERSONAL FACTORS: Age, Past/current experiences, Profession, Time since onset of injury/illness/exacerbation, and 3+ comorbidities: see above are also affecting patient's functional outcome.   REHAB POTENTIAL: Good  CLINICAL DECISION MAKING: Stable/uncomplicated  EVALUATION COMPLEXITY: Low   PLAN:  PT FREQUENCY: 2x/week  PT DURATION: 4 weeks  PLANNED INTERVENTIONS: 97164- PT Re-evaluation, 97750- Physical Performance Testing, 97110-Therapeutic exercises, 97530- Therapeutic activity, [id number]- Neuromuscular re-education, 97535- Self Care, [id number]- Manual therapy, [phone]- Gait training, [phone]- Orthotic Initial, [phone]- Orthotic/Prosthetic subsequent, [phone]- Canalith repositioning, [phone]- Aquatic Therapy, Patient/Family education, Balance training, Stair training, Vestibular training, Visual/preceptual remediation/compensation, Cognitive remediation, and DME instructions  PLAN FOR NEXT SESSION: re-check R posterior, check L posterior- treat PRN    Delon DELENA Pop, PT Delon DELENA Pop, PT, DPT, CBIS  05/01/2024, 9:46 AM

## 2024-05-07 ENCOUNTER — Ambulatory Visit: Attending: Registered Nurse | Admitting: Physical Therapy

## 2024-05-07 ENCOUNTER — Encounter: Payer: Self-pay | Admitting: Physical Therapy

## 2024-05-07 VITALS — BP 121/76 | HR 64

## 2024-05-07 DIAGNOSIS — R42 Dizziness and giddiness: Secondary | ICD-10-CM | POA: Diagnosis not present

## 2024-05-07 DIAGNOSIS — R2681 Unsteadiness on feet: Secondary | ICD-10-CM | POA: Insufficient documentation

## 2024-05-07 NOTE — Therapy (Signed)
 OUTPATIENT PHYSICAL THERAPY VESTIBULAR TREATMENT     Patient Name: Anne [name] MRN: [id number] DOB:09/01/1956, 68 y.o., female Today's Date: 05/07/2024  END OF SESSION:  PT End of Session - 05/07/24 0934     Visit Number 2    Number of Visits 9    Date for PT Re-Evaluation 05/31/24    Authorization Type HTA    PT Start Time 0932    PT Stop Time 0955   full time not used due to resolution of BPPV   PT Time Calculation (min) 23 min    Activity Tolerance Patient tolerated treatment well    Behavior During Therapy WFL for tasks assessed/performed          Past Medical History:  Diagnosis Date   Anxiety    Arthritis    Cataract    hx of   Chronic low back pain    Deviated septum    Dyslipidemia    GERD (gastroesophageal reflux disease)    occ   History of hiatal hernia    Hypertension    Legally blind    Right eye   Major depression    Migraines    Morbid obesity (HCC)    Pneumonia    Past Surgical History:  Procedure Laterality Date   CATARACT EXTRACTION, BILATERAL     CHOLECYSTECTOMY     COLONOSCOPY WITH PROPOFOL  N/A 11/12/2013   Procedure: COLONOSCOPY WITH PROPOFOL ;  Surgeon: Gladis MARLA Louder, MD;  Location: [facility];  Service: Endoscopy;  Laterality: N/A;   IR EMBO VENOUS NOT HEMORR HEMANG  INC GUIDE ROADMAPPING  12/03/2019   lower back surgery     RETINAL DETACHMENT SURGERY Right    x2   right arm tendon repair  2005   ROTATOR CUFF REPAIR Bilateral    both sholulders   TOTAL KNEE ARTHROPLASTY Left 12/26/2016   Procedure: LEFT TOTAL KNEE ARTHROPLASTY;  Surgeon: Dempsey Moan, MD;  Location: [facility];  Service: Orthopedics;  Laterality: Left;  requests   TOTAL KNEE ARTHROPLASTY Right 11/05/2018   Procedure: TOTAL KNEE ARTHROPLASTY;  Surgeon: Moan Dempsey, MD;  Location: [facility];  Service: Orthopedics;  Laterality: Right;    Patient Active Problem List   Diagnosis Date Noted   Port-A-Cath in place 01/09/2023   Anemia of chronic disease  02/23/2022   Blood per rectum 02/23/2022   BRBPR (bright red blood per rectum) 02/23/2022   Hyperglycemia 02/23/2022   Anxiety 02/09/2022   Class 2 obesity with body mass index (BMI) of 38.0 to 38.9 in adult 02/09/2022   Elevated coronary artery calcium  score 02/09/2022   Hyperlipidemia 02/09/2022   Hypertension 02/09/2022   Hypokalemia 02/09/2022   Cancer of ascending colon metastatic to intra-abdominal lymph node (HCC) 01/27/2022   Arthritis of carpometacarpal (CMC) joint of both thumbs 05/05/2021   Bilateral carpal tunnel syndrome 05/05/2021   Pain in right foot 03/05/2019   Stiffness of right knee 11/12/2018   History of total left knee replacement 12/26/2017   Tear of left rotator cuff 11/30/2017   Acute pain of left shoulder 11/14/2017   OA (osteoarthritis) of knee 12/26/2016    PCP: Ryan Hives, MD REFERRING PROVIDER: Ronal Leeroy Marie, FNP  REFERRING DIAG: R42 (ICD-10-CM) - Vertigo   THERAPY DIAG:  Unsteadiness on feet  Dizziness and giddiness  ONSET DATE: 04/19/24 referral   Rationale for Evaluation and Treatment: Rehabilitation  SUBJECTIVE:   SUBJECTIVE STATEMENT: Doing much better since eval. Rarely having dizziness. Not having vertigo, maybe just a  head rush if getting up too quickly. Has been doing her Wilhelmena Carrel exercises.   Pt accompanied by: self  PERTINENT HISTORY: anxiety, cataracts, LBP, GERD, HTN, R eye legally blind, migraines  PAIN:  Are you having pain? No  PRECAUTIONS: Fall R eye legally blind   WEIGHT BEARING RESTRICTIONS: No  FALLS: Has patient fallen in last 6 months? No  LIVING ENVIRONMENT: Lives with: lives alone Lives in: House/apartment Stairs: Yes: External: 8 steps; can reach both Has following equipment at home: Walker - 2 wheeled and shower chair  PLOF: Independent  PATIENT GOALS: to not be dizzy   OBJECTIVE:  Note: Objective measures were completed at Evaluation unless otherwise noted.  DIAGNOSTIC FINDINGS:  none relevant   COGNITION: Overall cognitive status: Within functional limits for tasks assessed   SENSATION: WFL  Cervical ROM:   WFL, pain free  STRENGTH: WFL  BED MOBILITY:  Reports independent, intermittent dizziness with bed mobility, namely rolling R  GAIT: Gait pattern: WFL   VESTIBULAR ASSESSMENT:  GENERAL OBSERVATION: NAD, no AD   SYMPTOM BEHAVIOR:  Subjective history: see above  Non-Vestibular symptoms: none  Type of dizziness: Spinning/Vertigo  Frequency: intermittent   Duration: seconds  Aggravating factors: Induced by position change: lying supine, rolling to the right, rolling to the left, and supine to sit  Relieving factors: rest  Progression of symptoms: better  OCULOMOTOR EXAM:  Ocular Alignment: abnormal R eye legally blind; progressive lenses   Ocular ROM: No Limitations  Spontaneous Nystagmus: absent  Gaze-Induced Nystagmus: absent  Smooth Pursuits: intact  Saccades: intact  Convergence/Divergence: ~10 cm R eye not converging   VESTIBULAR - OCULAR REFLEX:   Slow VOR: Normal  VOR Cancellation: Normal  Head-Impulse Test: HIT Right: slow HIT Left: negative   POSITIONAL TESTING: Right Dix-Hallpike: upbeating, right nystagmus Left Dix-Hallpike: to be assessed Right Roll Test: no nystagmus Left Roll Test: no nystagmus  MOTION SENSITIVITY:  Motion Sensitivity Quotient Intensity: 0 = none, 1 = Lightheaded, 2 = Mild, 3 = Moderate, 4 = Severe, 5 = Vomiting  Intensity  1. Sitting to supine 0  2. Supine to L side 0  3. Supine to R side 2  4. Supine to sitting 1  5. L Hallpike-Dix   6. Up from L    7. R Hallpike-Dix 4  8. Up from R  0  9. Sitting, head tipped to L knee   10. Head up from L knee   11. Sitting, head tipped to R knee   12. Head up from R knee   13. Sitting head turns x5   14.Sitting head nods x5   15. In stance, 180 turn to L    16. In stance, 180 turn to R                                                                                                                               TREATMENT  Therapeutic Activity: Vitals:  05/07/24 0939  BP: 121/76  Pulse: 64   POSITIONAL TESTING: Right Dix-Hallpike: no nystagmus Left Dix-Hallpike: appeared to initially have some L upbeating rotary nystagmus, but pt reporting no dizziness, re-assessed 2 more times with no nystagmus/dizziness  Right Sidelying: no nystagmus Left Sidelying: no nystagmus  Discussed resolution of BPPV at this time. Pt has been consistently performing her Wilhelmena Carrel exercises at home and pt also aware of Self epley maneuver and how to perform at home (pt does not need a handout for this). Discussed reoccurrence and that if it returns in the future, pt will need a new referral. Will hold pt's chart open for the next month in case it does return. Pt would like to keep Friday's appt at this time and if she continues to feel better, then she will cancel. Pt in agreement with this plan.     PATIENT EDUCATION: Education details: See above. Resolution of BPPV  Person educated: Patient Education method: Explanation and Demonstration Education comprehension: verbalized understanding and needs further education  HOME EXERCISE PROGRAM: Sit to Side-Lying    Sit on edge of bed. 1. Turn head 45 to right. 2. Maintain head position and lie down slowly on left side. Hold until symptoms subside. 3. Sit up slowly. Hold until symptoms subside. 4. Turn head 45 to left. 5. Maintain head position and lie down slowly on right side. Hold until symptoms subside. 6. Sit up slowly. Repeat sequence ___3_ times per session. Do __3__ sessions per day. GOALS: Goals reviewed with patient? Yes   SHORT TERM GOALS: = LTG based on PT POC length   LONG TERM GOALS: Target date: 05/31/24  Pt will be independent with final HEP for improved symptoms  Baseline: to be updated Goal status: INITIAL  2.  Patient will demonstrate (-) positional testing to indicate  resolution of BPPV  Baseline: at least R posterior canal canalithiasis  Goal status: INITIAL   ASSESSMENT:  CLINICAL IMPRESSION: Pt reporting feeling much better since she was last here for eval. Re-assessed R DixHallpike and pt negative for dizziness/nystagmus. Assessed L DixHallpike and pt appeared to have some very mild L upbeating rotary nystagmus, but pt had no dizziness. Re-assessed 2 more times with no nystagmus or dizziness noted. Discussed resolution of BPPV at this time. Will keep Friday's appt just in case and pt will cancel if still feeling better. Pt in agreement with plan and overall very pleased with how much better she feels. Pt in understanding regarding Wilhelmena Carrel and self Epley for home in case it does return.   OBJECTIVE IMPAIRMENTS: dizziness.   ACTIVITY LIMITATIONS: bed mobility and locomotion level  PARTICIPATION LIMITATIONS: community activity and occupation  PERSONAL FACTORS: Age, Past/current experiences, Profession, Time since onset of injury/illness/exacerbation, and 3+ comorbidities: see above are also affecting patient's functional outcome.   REHAB POTENTIAL: Good  CLINICAL DECISION MAKING: Stable/uncomplicated  EVALUATION COMPLEXITY: Low   PLAN:  PT FREQUENCY: 2x/week  PT DURATION: 4 weeks  PLANNED INTERVENTIONS: 97164- PT Re-evaluation, 97750- Physical Performance Testing, 97110-Therapeutic exercises, 97530- Therapeutic activity, [id number]- Neuromuscular re-education, 97535- Self Care, [id number]- Manual therapy, [id number]- Gait training, [phone]- Orthotic Initial, [id number]- Orthotic/Prosthetic subsequent, [id number]- Canalith repositioning, [phone]- Aquatic Therapy, Patient/Family education, Balance training, Stair training, Vestibular training, Visual/preceptual remediation/compensation, Cognitive remediation, and DME instructions  PLAN FOR NEXT SESSION: re-check R posterior, check L posterior- treat PRN   Pt felt better on 8/5 and just wanted to keep appt on Friday  just in case    Sheffield Senate, PT, DPT  05/07/24 10:00 AM

## 2024-05-10 ENCOUNTER — Ambulatory Visit

## 2024-05-13 ENCOUNTER — Encounter: Admitting: Physical Therapy

## 2024-05-17 ENCOUNTER — Encounter

## 2024-05-20 ENCOUNTER — Encounter

## 2024-05-23 ENCOUNTER — Encounter

## 2024-05-28 ENCOUNTER — Encounter: Admitting: Physical Therapy

## 2024-06-04 DIAGNOSIS — I10 Essential (primary) hypertension: Secondary | ICD-10-CM | POA: Diagnosis not present

## 2024-06-04 DIAGNOSIS — E7800 Pure hypercholesterolemia, unspecified: Secondary | ICD-10-CM | POA: Diagnosis not present

## 2024-06-04 DIAGNOSIS — Z0000 Encounter for general adult medical examination without abnormal findings: Secondary | ICD-10-CM | POA: Diagnosis not present

## 2024-06-04 DIAGNOSIS — R739 Hyperglycemia, unspecified: Secondary | ICD-10-CM | POA: Diagnosis not present

## 2024-06-04 DIAGNOSIS — E559 Vitamin D deficiency, unspecified: Secondary | ICD-10-CM | POA: Diagnosis not present

## 2024-06-04 DIAGNOSIS — R7989 Other specified abnormal findings of blood chemistry: Secondary | ICD-10-CM | POA: Diagnosis not present

## 2024-06-05 LAB — LAB REPORT - SCANNED
A1c: 5.8
EGFR: 86

## 2024-06-18 ENCOUNTER — Other Ambulatory Visit: Payer: Self-pay | Admitting: Internal Medicine

## 2024-06-18 DIAGNOSIS — Z1231 Encounter for screening mammogram for malignant neoplasm of breast: Secondary | ICD-10-CM

## 2024-06-18 DIAGNOSIS — E7800 Pure hypercholesterolemia, unspecified: Secondary | ICD-10-CM | POA: Diagnosis not present

## 2024-06-18 DIAGNOSIS — I2510 Atherosclerotic heart disease of native coronary artery without angina pectoris: Secondary | ICD-10-CM | POA: Diagnosis not present

## 2024-06-18 DIAGNOSIS — I10 Essential (primary) hypertension: Secondary | ICD-10-CM | POA: Diagnosis not present

## 2024-06-18 DIAGNOSIS — Z23 Encounter for immunization: Secondary | ICD-10-CM | POA: Diagnosis not present

## 2024-06-18 DIAGNOSIS — R7303 Prediabetes: Secondary | ICD-10-CM | POA: Diagnosis not present

## 2024-06-18 DIAGNOSIS — E559 Vitamin D deficiency, unspecified: Secondary | ICD-10-CM | POA: Diagnosis not present

## 2024-06-18 DIAGNOSIS — Z0000 Encounter for general adult medical examination without abnormal findings: Secondary | ICD-10-CM | POA: Diagnosis not present

## 2024-06-18 DIAGNOSIS — Z85038 Personal history of other malignant neoplasm of large intestine: Secondary | ICD-10-CM | POA: Diagnosis not present

## 2024-08-13 ENCOUNTER — Other Ambulatory Visit: Payer: Self-pay

## 2024-08-13 ENCOUNTER — Encounter: Payer: Self-pay | Admitting: Emergency Medicine

## 2024-08-13 ENCOUNTER — Ambulatory Visit
Admission: EM | Admit: 2024-08-13 | Discharge: 2024-08-13 | Disposition: A | Discharge status: Home / Self Care | Attending: Student | Admitting: Student

## 2024-08-13 DIAGNOSIS — J0100 Acute maxillary sinusitis, unspecified: Secondary | ICD-10-CM | POA: Diagnosis not present

## 2024-08-13 MED ORDER — AMOXICILLIN-POT CLAVULANATE 875-125 MG PO TABS: 1.0000 | ORAL_TABLET | Freq: Two times a day (BID) | ORAL | 0 refills | Status: AC

## 2024-08-13 MED ORDER — FLUCONAZOLE 150 MG PO TABS: 150.0000 mg | ORAL_TABLET | Freq: Every day | ORAL | 0 refills | Status: AC

## 2024-08-13 NOTE — ED Provider Notes (Signed)
 [name]    CSN: [id number] Arrival date & time: 08/13/24  9073      History   Chief Complaint Chief Complaint  Patient presents with   Cough    HPI Anne [name] is a 68 y.o. female presenting with cough and congestion x2 weeks, getting worse. Cough is productive of green sputum. Thick nasal congestion. Temperature 98.8 on11/9/25 at home. Denies SOB, CP. Denies h/o pulm ds. Has attempted mucinex , nasal sprays  HPI  Past Medical History:  Diagnosis Date   Anxiety    Arthritis    Cataract    hx of   Chronic low back pain    Deviated septum    Dyslipidemia    GERD (gastroesophageal reflux disease)    occ   History of hiatal hernia    Hypertension    Legally blind    Right eye   Major depression    Migraines    Morbid obesity (HCC)    Pneumonia     Patient Active Problem List   Diagnosis Date Noted   Port-A-Cath in place 01/09/2023   Anemia of chronic disease 02/23/2022   Blood per rectum 02/23/2022   BRBPR (bright red blood per rectum) 02/23/2022   Hyperglycemia 02/23/2022   Anxiety 02/09/2022   Class 2 obesity with body mass index (BMI) of 38.0 to 38.9 in adult 02/09/2022   Elevated coronary artery calcium  score 02/09/2022   Hyperlipidemia 02/09/2022   Hypertension 02/09/2022   Hypokalemia 02/09/2022   Cancer of ascending colon metastatic to intra-abdominal lymph node (HCC) 01/27/2022   Arthritis of carpometacarpal (CMC) joint of both thumbs 05/05/2021   Bilateral carpal tunnel syndrome 05/05/2021   Pain in right foot 03/05/2019   Stiffness of right knee 11/12/2018   History of total left knee replacement 12/26/2017   Tear of left rotator cuff 11/30/2017   Acute pain of left shoulder 11/14/2017   OA (osteoarthritis) of knee 12/26/2016    Past Surgical History:  Procedure Laterality Date   CATARACT EXTRACTION, BILATERAL     CHOLECYSTECTOMY     COLONOSCOPY WITH PROPOFOL  N/A 11/12/2013   Procedure: COLONOSCOPY WITH PROPOFOL ;  Surgeon:  Gladis MARLA Louder, MD;  Location: [facility];  Service: Endoscopy;  Laterality: N/A;   IR EMBO VENOUS NOT HEMORR HEMANG  INC GUIDE ROADMAPPING  12/03/2019   lower back surgery     RETINAL DETACHMENT SURGERY Right    x2   right arm tendon repair  2005   ROTATOR CUFF REPAIR Bilateral    both sholulders   TOTAL KNEE ARTHROPLASTY Left 12/26/2016   Procedure: LEFT TOTAL KNEE ARTHROPLASTY;  Surgeon: Dempsey Moan, MD;  Location: [facility];  Service: Orthopedics;  Laterality: Left;  requests   TOTAL KNEE ARTHROPLASTY Right 11/05/2018   Procedure: TOTAL KNEE ARTHROPLASTY;  Surgeon: Moan Dempsey, MD;  Location: [facility];  Service: Orthopedics;  Laterality: Right;     OB History     Gravida  0   Para      Term      Preterm      AB      Living         SAB      IAB      Ectopic      Multiple      Live Births               Home Medications    Prior to Admission medications   Medication Sig Start Date End  Date Taking? Authorizing Provider  amoxicillin -clavulanate (AUGMENTIN ) 875-125 MG tablet Take 1 tablet by mouth every 12 (twelve) hours. 08/13/24  Yes Zaleah Ternes E, PA-C  fluconazole  (DIFLUCAN ) 150 MG tablet Take 1 tablet (150 mg total) by mouth daily. -For your yeast infection, start the Diflucan  (fluconazole )- Take one pill today (day 1). If you're still having symptoms in 3 days, take the second pill. 08/13/24  Yes Johna Kearl E, PA-C  ALPRAZolam  (XANAX ) 1 MG tablet Take 1 mg by mouth at bedtime as needed for sleep.    [provider]  atorvastatin  (LIPITOR) 40 MG tablet Take 1 tablet (40 mg total) by mouth at bedtime. 02/08/22   Alvan Ronal BRAVO, MD  benzonatate  (TESSALON ) 100 MG capsule Take 1 capsule (100 mg total) by mouth every 8 (eight) hours as needed for cough. 06/01/23   Hazen Darryle BRAVO, FNP  buPROPion  (WELLBUTRIN  XL) 150 MG 24 hr tablet Take 150 mg by mouth daily.    [provider]  calcium  carbonate (TUMS - DOSED IN MG ELEMENTAL  CALCIUM ) 500 MG chewable tablet Chew 1-2 tablets by mouth daily as needed for indigestion or heartburn.    [provider]  Calcium  Carbonate-Vitamin D (CALTRATE 600+D PO) Take 1 tablet by mouth daily.    [provider]  Carboxymethylcell-Hypromellose (GENTEAL OP) Apply 1 drop to eye 3 (three) times daily as needed (dry eyes).    [provider]  Feverfew 380 MG CAPS Take 380 mg by mouth daily.    [provider]  Multiple Vitamin (MULTIVITAMIN WITH MINERALS) TABS tablet Take 1 tablet by mouth daily.    [provider]  NEOMYCIN -POLYMYXIN-HYDROCORTISONE (CORTISPORIN) 1 % SOLN OTIC solution Apply 1-2 drops to toe BID after soaking 01/09/23   Hyatt, Max T, DPM  potassium chloride  SA (K-DUR,KLOR-CON ) 20 MEQ tablet Take 20 mEq by mouth at bedtime.  12/12/16   [provider]  triamterene -hydrochlorothiazide  (MAXZIDE ) 75-50 MG per tablet Take 0.5 tablets by mouth daily.     [provider]    Family History Family History  Problem Relation Age of Onset   Diabetes Mother    Hypertension Mother    Heart attack Father    Cancer Father        lung    Social History Social History   Tobacco Use   Smoking status: Never   Smokeless tobacco: Never  Vaping Use   Vaping status: Never Used  Substance Use Topics   Alcohol use: Yes    Comment: very rare use   Drug use: No     Allergies   Patient has no known allergies.   Review of Systems Review of Systems  Constitutional:  Negative for appetite change, chills and fever.  HENT:  Positive for congestion. Negative for ear pain, rhinorrhea, sinus pressure, sinus pain and sore throat.   Eyes:  Negative for redness and visual disturbance.  Respiratory:  Positive for cough. Negative for chest tightness, shortness of breath and wheezing.   Cardiovascular:  Negative for chest pain and palpitations.  Gastrointestinal:  Negative for abdominal pain, constipation, diarrhea, nausea and  vomiting.  Genitourinary:  Negative for dysuria, frequency and urgency.  Musculoskeletal:  Negative for myalgias.  Neurological:  Negative for dizziness, weakness and headaches.  Psychiatric/Behavioral:  Negative for confusion.   All other systems reviewed and are negative.    Physical Exam Triage Vital Signs ED Triage Vitals  Encounter Vitals Group     BP 08/13/24 1053 (!) 167/91  Girls Systolic BP Percentile --      Girls Diastolic BP Percentile --      Boys Systolic BP Percentile --      Boys Diastolic BP Percentile --      Pulse Rate 08/13/24 1053 78     Resp 08/13/24 1053 18     Temp 08/13/24 1053 97.9 F (36.6 C)     Temp Source 08/13/24 1053 Oral     SpO2 08/13/24 1053 95 %     Weight --      Height --      Head Circumference --      Peak Flow --      Pain Score 08/13/24 1054 0     Pain Loc --      Pain Education --      Exclude from Growth Chart --    No data found.  Updated Vital Signs BP (!) 167/91 (BP Location: Left Arm)   Pulse 78   Temp 97.9 F (36.6 C) (Oral)   Resp 18   LMP  (LMP Unknown)   SpO2 95%   Visual Acuity Right Eye Distance:   Left Eye Distance:   Bilateral Distance:    Right Eye Near:   Left Eye Near:    Bilateral Near:     Physical Exam Vitals reviewed.  Constitutional:      General: She is not in acute distress.    Appearance: Normal appearance. She is not ill-appearing.  HENT:     Head: Normocephalic and atraumatic.     Right Ear: Tympanic membrane, ear canal and external ear normal. No tenderness. No middle ear effusion. There is no impacted cerumen. Tympanic membrane is not perforated, erythematous, retracted or bulging.     Left Ear: Tympanic membrane, ear canal and external ear normal. No tenderness.  No middle ear effusion. There is no impacted cerumen. Tympanic membrane is not perforated, erythematous, retracted or bulging.     Nose: No congestion.     Right Sinus: Maxillary sinus tenderness present. No frontal sinus  tenderness.     Left Sinus: Maxillary sinus tenderness present. No frontal sinus tenderness.     Mouth/Throat:     Mouth: Mucous membranes are moist.     Pharynx: Uvula midline. No oropharyngeal exudate or posterior oropharyngeal erythema.  Eyes:     Extraocular Movements: Extraocular movements intact.     Pupils: Pupils are equal, round, and reactive to light.  Cardiovascular:     Rate and Rhythm: Normal rate and regular rhythm.     Heart sounds: Normal heart sounds.  Pulmonary:     Effort: Pulmonary effort is normal.     Breath sounds: Normal breath sounds. No decreased breath sounds, wheezing, rhonchi or rales.  Abdominal:     Palpations: Abdomen is soft.     Tenderness: There is no abdominal tenderness. There is no guarding or rebound.  Lymphadenopathy:     Cervical: No cervical adenopathy.     Right cervical: No superficial cervical adenopathy.    Left cervical: No superficial cervical adenopathy.  Neurological:     General: No focal deficit present.     Mental Status: She is alert and oriented to person, place, and time.  Psychiatric:        Mood and Affect: Mood normal.        Behavior: Behavior normal.        Thought Content: Thought content normal.        Judgment: Judgment normal.  UC Treatments / Results  Labs (all labs ordered are listed, but only abnormal results are displayed) Labs Reviewed - No data to display  EKG   Radiology No results found.  Procedures Procedures (including critical care time)  Medications Ordered in UC Medications - No data to display  Initial Impression / Assessment and Plan / UC Course  I have reviewed the triage vital signs and the nursing notes.  Pertinent labs & imaging results that were available during my care of the patient were reviewed by me and considered in my medical decision making (see chart for details).     Patient is a pleasant 68 year old female presenting with maxillary sinusitis. The patient is  afebrile and nontachycardic.  Antipyretic has not been administered today.  On exam, there are no adventitious breath sounds.  She does not have a history of pulmonary disease.  Did not check a COVID or influenza test due to duration of symptoms.  Augmentin  sent.  Diflucan  sent for yeast prophylaxis.   Final Clinical Impressions(s) / UC Diagnoses   Final diagnoses:  Acute non-recurrent maxillary sinusitis     Discharge Instructions      -Start the antibiotic-Augmentin  (amoxicillin -clavulanate), 1 pill every 12 hours for 7 days.  You can take this with food like with breakfast and dinner. -For your yeast infection, start the Diflucan  (fluconazole )- Take one pill today (day 1). If you're still having symptoms in 3 days, take the second pill.  -Your cough should slowly get better instead of worse. If you develop a cough productive of dark or red sputum, new shortness of breath, new chest tightness, new fevers, etc - seek additional care.      ED Prescriptions     Medication Sig Dispense Auth. Provider   amoxicillin -clavulanate (AUGMENTIN ) 875-125 MG tablet Take 1 tablet by mouth every 12 (twelve) hours. 14 tablet Adriell Polansky E, PA-C   fluconazole  (DIFLUCAN ) 150 MG tablet Take 1 tablet (150 mg total) by mouth daily. -For your yeast infection, start the Diflucan  (fluconazole )- Take one pill today (day 1). If you're still having symptoms in 3 days, take the second pill. 2 tablet Uel Davidow E, PA-C      PDMP not reviewed this encounter.   Arlyss Leita BRAVO, PA-C 08/13/24 1220

## 2024-08-13 NOTE — Discharge Instructions (Addendum)
-  Start the antibiotic-Augmentin  (amoxicillin -clavulanate), 1 pill every 12 hours for 7 days.  You can take this with food like with breakfast and dinner. -For your yeast infection, start the Diflucan  (fluconazole )- Take one pill today (day 1). If you're still having symptoms in 3 days, take the second pill.  -Your cough should slowly get better instead of worse. If you develop a cough productive of dark or red sputum, new shortness of breath, new chest tightness, new fevers, etc - seek additional care.

## 2024-08-13 NOTE — ED Triage Notes (Signed)
 Pt here for cough x 2 weeks with congestion x hoarse voice; pt sts feeling worse x 2 days

## 2024-09-05 DIAGNOSIS — C182 Malignant neoplasm of ascending colon: Secondary | ICD-10-CM | POA: Diagnosis not present

## 2024-09-05 DIAGNOSIS — C772 Secondary and unspecified malignant neoplasm of intra-abdominal lymph nodes: Secondary | ICD-10-CM | POA: Diagnosis not present

## 2024-10-04 ENCOUNTER — Ambulatory Visit
Admission: RE | Admit: 2024-10-04 | Discharge: 2024-10-04 | Disposition: A | Discharge status: Home / Self Care | Source: Ambulatory Visit | Attending: Internal Medicine | Admitting: Internal Medicine

## 2024-10-04 DIAGNOSIS — Z1231 Encounter for screening mammogram for malignant neoplasm of breast: Secondary | ICD-10-CM
